# Patient Record
Sex: Female | Born: 2000 | Race: Black or African American | Hispanic: No | Marital: Single | Smoking: Never smoker
Health system: Southern US, Community
[De-identification: ages and names within clinical notes are randomized; demographics above are authoritative.]

## PROBLEM LIST (undated history)

## (undated) DIAGNOSIS — L509 Urticaria, unspecified: Secondary | ICD-10-CM

## (undated) DIAGNOSIS — F251 Schizoaffective disorder, depressive type: Secondary | ICD-10-CM

## (undated) DIAGNOSIS — D649 Anemia, unspecified: Secondary | ICD-10-CM

## (undated) DIAGNOSIS — R0602 Shortness of breath: Secondary | ICD-10-CM

## (undated) DIAGNOSIS — F32A Depression, unspecified: Secondary | ICD-10-CM

## (undated) DIAGNOSIS — E669 Obesity, unspecified: Secondary | ICD-10-CM

## (undated) DIAGNOSIS — I10 Essential (primary) hypertension: Secondary | ICD-10-CM

## (undated) DIAGNOSIS — F319 Bipolar disorder, unspecified: Secondary | ICD-10-CM

## (undated) DIAGNOSIS — D563 Thalassemia minor: Secondary | ICD-10-CM

## (undated) DIAGNOSIS — Z91018 Allergy to other foods: Secondary | ICD-10-CM

## (undated) DIAGNOSIS — R600 Localized edema: Secondary | ICD-10-CM

## (undated) DIAGNOSIS — E161 Other hypoglycemia: Secondary | ICD-10-CM

## (undated) DIAGNOSIS — M549 Dorsalgia, unspecified: Secondary | ICD-10-CM

## (undated) DIAGNOSIS — E119 Type 2 diabetes mellitus without complications: Secondary | ICD-10-CM

## (undated) DIAGNOSIS — Z7689 Persons encountering health services in other specified circumstances: Secondary | ICD-10-CM

## (undated) DIAGNOSIS — N943 Premenstrual tension syndrome: Secondary | ICD-10-CM

## (undated) DIAGNOSIS — F4310 Post-traumatic stress disorder, unspecified: Secondary | ICD-10-CM

## (undated) DIAGNOSIS — E611 Iron deficiency: Secondary | ICD-10-CM

## (undated) DIAGNOSIS — N946 Dysmenorrhea, unspecified: Secondary | ICD-10-CM

## (undated) DIAGNOSIS — F988 Other specified behavioral and emotional disorders with onset usually occurring in childhood and adolescence: Secondary | ICD-10-CM

## (undated) DIAGNOSIS — F909 Attention-deficit hyperactivity disorder, unspecified type: Secondary | ICD-10-CM

## (undated) DIAGNOSIS — F329 Major depressive disorder, single episode, unspecified: Secondary | ICD-10-CM

## (undated) DIAGNOSIS — F419 Anxiety disorder, unspecified: Secondary | ICD-10-CM

## (undated) DIAGNOSIS — N926 Irregular menstruation, unspecified: Secondary | ICD-10-CM

## (undated) HISTORY — DX: Allergy to other foods: Z91.018

## (undated) HISTORY — DX: Localized edema: R60.0

## (undated) HISTORY — DX: Irregular menstruation, unspecified: N92.6

## (undated) HISTORY — DX: Dorsalgia, unspecified: M54.9

## (undated) HISTORY — DX: Thalassemia minor: D56.3

## (undated) HISTORY — DX: Iron deficiency: E61.1

## (undated) HISTORY — DX: Shortness of breath: R06.02

## (undated) HISTORY — DX: Dysmenorrhea, unspecified: N94.6

## (undated) HISTORY — DX: Urticaria, unspecified: L50.9

## (undated) HISTORY — DX: Obesity, unspecified: E66.9

## (undated) HISTORY — DX: Other hypoglycemia: E16.1

## (undated) HISTORY — PX: TONSILLECTOMY: SUR1361

## (undated) HISTORY — DX: Bipolar disorder, unspecified: F31.9

## (undated) HISTORY — DX: Anxiety disorder, unspecified: F41.9

## (undated) HISTORY — DX: Type 2 diabetes mellitus without complications: E11.9

## (undated) HISTORY — DX: Persons encountering health services in other specified circumstances: Z76.89

## (undated) HISTORY — DX: Premenstrual tension syndrome: N94.3

## (undated) HISTORY — DX: Post-traumatic stress disorder, unspecified: F43.10

## (undated) HISTORY — PX: ADENOIDECTOMY: SUR15

## (undated) HISTORY — DX: Attention-deficit hyperactivity disorder, unspecified type: F90.9

---
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
## 2019-02-28 ENCOUNTER — Other Ambulatory Visit: Payer: Self-pay

## 2019-02-28 ENCOUNTER — Emergency Department (HOSPITAL_COMMUNITY)
Admission: EM | Admit: 2019-02-28 | Discharge: 2019-02-28 | Disposition: A | Discharge status: Home / Self Care | Payer: Medicaid Other | Attending: Emergency Medicine | Admitting: Emergency Medicine

## 2019-02-28 ENCOUNTER — Encounter (HOSPITAL_COMMUNITY): Payer: Self-pay | Admitting: Emergency Medicine

## 2019-02-28 ENCOUNTER — Emergency Department (HOSPITAL_COMMUNITY): Payer: Medicaid Other

## 2019-02-28 DIAGNOSIS — E119 Type 2 diabetes mellitus without complications: Secondary | ICD-10-CM | POA: Diagnosis not present

## 2019-02-28 DIAGNOSIS — R6883 Chills (without fever): Secondary | ICD-10-CM | POA: Diagnosis not present

## 2019-02-28 DIAGNOSIS — Z882 Allergy status to sulfonamides status: Secondary | ICD-10-CM | POA: Diagnosis not present

## 2019-02-28 DIAGNOSIS — R05 Cough: Secondary | ICD-10-CM | POA: Diagnosis not present

## 2019-02-28 DIAGNOSIS — Z20828 Contact with and (suspected) exposure to other viral communicable diseases: Secondary | ICD-10-CM | POA: Insufficient documentation

## 2019-02-28 DIAGNOSIS — B349 Viral infection, unspecified: Secondary | ICD-10-CM | POA: Diagnosis not present

## 2019-02-28 DIAGNOSIS — Z7984 Long term (current) use of oral hypoglycemic drugs: Secondary | ICD-10-CM | POA: Diagnosis not present

## 2019-02-28 DIAGNOSIS — Z794 Long term (current) use of insulin: Secondary | ICD-10-CM | POA: Diagnosis not present

## 2019-02-28 DIAGNOSIS — Z79899 Other long term (current) drug therapy: Secondary | ICD-10-CM | POA: Diagnosis not present

## 2019-02-28 DIAGNOSIS — I10 Essential (primary) hypertension: Secondary | ICD-10-CM | POA: Diagnosis not present

## 2019-02-28 DIAGNOSIS — R0602 Shortness of breath: Secondary | ICD-10-CM | POA: Diagnosis present

## 2019-02-28 DIAGNOSIS — Z8673 Personal history of transient ischemic attack (TIA), and cerebral infarction without residual deficits: Secondary | ICD-10-CM | POA: Diagnosis not present

## 2019-02-28 LAB — COMPREHENSIVE METABOLIC PANEL
ALT: 15 U/L (ref 0–44)
AST: 14 U/L — ABNORMAL LOW (ref 15–41)
Albumin: 3.7 g/dL (ref 3.5–5.0)
Alkaline Phosphatase: 101 U/L (ref 38–126)
Anion gap: 8 (ref 5–15)
BUN: 8 mg/dL (ref 6–20)
CO2: 23 mmol/L (ref 22–32)
Calcium: 9.1 mg/dL (ref 8.9–10.3)
Chloride: 104 mmol/L (ref 98–111)
Creatinine, Ser: 0.57 mg/dL (ref 0.44–1.00)
GFR calc Af Amer: >60 mL/min (ref >60)
GFR calc non Af Amer: >60 mL/min (ref >60)
Glucose, Bld: 93 mg/dL (ref 70–99)
Potassium: 3.9 mmol/L (ref 3.5–5.1)
Sodium: 135 mmol/L (ref 135–145)
Total Bilirubin: 0.5 mg/dL (ref 0.3–1.2)
Total Protein: 7.8 g/dL (ref 6.5–8.1)

## 2019-02-28 LAB — CBC WITH DIFFERENTIAL/PLATELET
Abs Immature Granulocytes: 0.01 10*3/uL (ref 0.00–0.07)
Basophils Absolute: 0 10*3/uL (ref 0.0–0.1)
Basophils Relative: 1 %
Eosinophils Absolute: 0.2 10*3/uL (ref 0.0–0.5)
Eosinophils Relative: 2 %
HCT: 36.1 % (ref 36.0–46.0)
Hemoglobin: 10.5 g/dL — ABNORMAL LOW (ref 12.0–15.0)
Immature Granulocytes: 0 %
Lymphocytes Relative: 29 %
Lymphs Abs: 2.1 10*3/uL (ref 0.7–4.0)
MCH: 19.7 pg — ABNORMAL LOW (ref 26.0–34.0)
MCHC: 29.1 g/dL — ABNORMAL LOW (ref 30.0–36.0)
MCV: 67.9 fL — ABNORMAL LOW (ref 80.0–100.0)
Monocytes Absolute: 0.8 10*3/uL (ref 0.1–1.0)
Monocytes Relative: 11 %
Neutro Abs: 4.3 10*3/uL (ref 1.7–7.7)
Neutrophils Relative %: 57 %
Platelets: 374 10*3/uL (ref 150–400)
RBC: 5.32 MIL/uL — ABNORMAL HIGH (ref 3.87–5.11)
RDW: 18.6 % — ABNORMAL HIGH (ref 11.5–15.5)
WBC: 7.4 10*3/uL (ref 4.0–10.5)
nRBC: 0 % (ref 0.0–0.2)

## 2019-02-28 LAB — URINALYSIS, ROUTINE W REFLEX MICROSCOPIC
Bacteria, UA: NONE SEEN
Bilirubin Urine: NEGATIVE
Glucose, UA: NEGATIVE mg/dL
Hgb urine dipstick: NEGATIVE
Ketones, ur: NEGATIVE mg/dL
Nitrite: NEGATIVE
Protein, ur: NEGATIVE mg/dL
Specific Gravity, Urine: 1.019 (ref 1.005–1.030)
pH: 6 (ref 5.0–8.0)

## 2019-02-28 LAB — SARS CORONAVIRUS 2 BY RT PCR (HOSPITAL ORDER, PERFORMED IN ~~LOC~~ HOSPITAL LAB): SARS Coronavirus 2: NEGATIVE

## 2019-02-28 NOTE — ED Provider Notes (Signed)
[facility] EMERGENCY DEPARTMENT Provider Note   CSN: [id number] Arrival date & time: 7[facility]/13/20  1318     History   Chief Complaint No chief complaint on file.   HPI Kathy Rios is a 18 y.o. female.     Patient complains of some shortness of breath and chills.  Mild cough.  The history is provided by the patient. No language interpreter was used.  Weakness Severity:  Mild Onset quality:  Sudden Timing:  Intermittent Progression:  Unable to specify Chronicity:  New Context: not alcohol use   Relieved by:  Nothing Worsened by:  Nothing Associated symptoms: no abdominal pain, no chest pain, no cough, no diarrhea, no frequency, no headaches and no seizures     Past Medical History:  Diagnosis Date  . ADD (attention deficit disorder)   . ADHD (attention deficit hyperactivity disorder)   . Anemia   . Anxiety   . Depression   . Diabetes mellitus   . Diabetes mellitus, type II ([facility])   . Encounter for menstrual regulation 01/25/2016  . Hypertension   . Irregular periods 10/25/2015  . Obesity   . PMS (premenstrual syndrome) 11/05/2015  . Reactive hypoglycemia   . Severe menstrual cramps 11/05/2015  . Thalassemia trait   . Thalassemia trait, alpha     Patient Active Problem List   Diagnosis Date Noted  . Constipation 03/08/2018  . Acanthosis nigricans 01/28/2017  . Prediabetes 07/01/2016  . Encounter for menstrual regulation 01/25/2016  . Severe menstrual cramps 11/05/2015  . PMS (premenstrual syndrome) 11/05/2015  . Irregular periods 10/25/2015  . 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 Tinea versicolor 12/23/2014  . PCOS (polycystic ovarian syndrome) 11/13/2014  . Alpha thalassemia trait 08/20/2012  . Hypertension 08/20/2012    Past Surgical History:  Procedure Laterality Date  . ADENOIDECTOMY    . TONSILLECTOMY    . WISDOM TOOTH EXTRACTION  07/2016     OB History    Gravida  0   Para  0   Term   0   Preterm  0   AB  0   Living  0     SAB  0   TAB  0   Ectopic  0   Multiple  0   Live Births               Home Medications    Prior to Admission medications   Medication Sig Start Date End Date Taking? Authorizing Provider  cetirizine (ZYRTEC) 10 MG tablet Take 10 mg by mouth as needed.    Yes [provider]  escitalopram (LEXAPRO) 20 MG tablet Take 1 tablet (20 mg total) by mouth daily. 12/21/18 12/21/19 Yes Myrlene Brokeross, Deborah R, MD  linaclotide [facility](LINZESS) 145 MCG CAPS capsule Take 1 capsule (145 mcg total) by mouth daily before breakfast. Patient taking differently: Take 145 mcg by mouth daily as needed (for constipation).  03/08/18  Yes Adline PotterGriffin, Jennifer A, NP  lisdexamfetamine (VYVANSE) 70 MG capsule Take 1 capsule (70 mg total) by mouth daily for 30 days. 12/21/18 02/28/19 Yes Myrlene Brokeross, Deborah R, MD  lisinopril (PRINIVIL,ZESTRIL) 10 MG tablet Take 10 mg by mouth daily.    Yes [provider]  Melatonin 5 MG TABS Take 2 tablets by mouth at bedtime as needed (sleep).    Yes [provider]  metFORMIN (GLUCOPHAGE) 500 MG tablet Take 500 mg by mouth 2 (two) times daily with a meal.     Yes [provider]  Multiple Vitamin (MULTIVITAMIN WITH MINERALS) TABS tablet Take 1 tablet by mouth daily.   Yes [provider]  Omega-3 Fatty Acids (FISH OIL PO) Take 1,000 mg by mouth daily.    Yes [provider]  valACYclovir (VALTREX) 1000 MG tablet Take 2 tablets by mouth 2 (two) times daily as needed (cold sores).  09/22/15  Yes [provider]  acetaminophen (TYLENOL) 500 MG tablet Take 1,000 mg by mouth every 6 (six) hours as needed for mild pain or headache.    [provider]  lisdexamfetamine (VYVANSE) 70 MG capsule Take 1 capsule (70 mg total) by mouth daily. Patient not taking: Reported on 02/28/2019 12/21/18   Myrlene Brokeross, Deborah R, MD  lisdexamfetamine (VYVANSE) 70 MG capsule Take 1 capsule (70 mg total) by mouth daily.  Patient not taking: Reported on 02/28/2019 12/21/18   Myrlene Brokeross, Deborah R, MD    Family History Family History  Problem Relation Age of Onset  . Seizures Mother   . Arthritis Mother   . Diabetes Mother   . Hyperlipidemia Mother   . Depression Mother   . Hyperlipidemia Father   . Hypertension Father   . Gout Father   . Dementia Father   . Hypertension Sister   . Mental illness Brother   . Hyperlipidemia Brother   . ADD / ADHD Brother   . Depression Brother   . Hypertension Maternal Grandmother   . Cancer Maternal Grandmother   . Hypertension Maternal Grandfather   . Thyroid disease Maternal Grandfather   . Anemia Paternal Grandmother   . Cancer Paternal Grandmother        cervical  . Hypertension Paternal Grandfather   . Heart disease Paternal Grandfather   . Hypertension Other   . Other Other        heart skips-maternal great grandma  . Other Other        fibroids- maternal grandma and great grandma  . Heart disease Other   . Schizophrenia Maternal Aunt   . Bipolar disorder Maternal Aunt   . Alcohol abuse Maternal Uncle     Social History Social History   Tobacco Use  . Smoking status: Never Smoker  . Smokeless tobacco: Never Used  Substance Use Topics  . Alcohol use: No  . Drug use: No     Allergies   Selenium sulfide, Motrin [ibuprofen], and Sulfa antibiotics   Review of Systems Review of Systems  Constitutional: Positive for chills. Negative for appetite change and fatigue.  HENT: Negative for congestion, ear discharge and sinus pressure.   Eyes: Negative for discharge.  Respiratory: Negative for cough.   Cardiovascular: Negative for chest pain.  Gastrointestinal: Negative for abdominal pain and diarrhea.  Genitourinary: Negative for frequency and hematuria.  Musculoskeletal: Negative for back pain.  Skin: Negative for rash.  Neurological: Positive for weakness. Negative for seizures and headaches.  Psychiatric/Behavioral: Negative for hallucinations.      Physical Exam Updated Vital Signs BP (!) 106/49   Pulse 70   Temp (!) 97.4 F (36.3 C) (Tympanic)   Resp (!) 25   Ht 5\' 5"  (1.651 m)   Wt 122.5 kg   LMP 02/06/2019   SpO2 99%   BMI 44.93 kg/m   Physical Exam Vitals signs and nursing note reviewed.  Constitutional:      Appearance: She is well-developed.  HENT:     Head: Normocephalic.     Nose: Nose normal.  Eyes:     General: No scleral icterus.  Conjunctiva/sclera: Conjunctivae normal.  Neck:     Musculoskeletal: Neck supple.     Thyroid: No thyromegaly.  Cardiovascular:     Rate and Rhythm: Normal rate and regular rhythm.     Heart sounds: No murmur. No friction rub. No gallop.   Pulmonary:     Breath sounds: No stridor. No wheezing or rales.  Chest:     Chest wall: No tenderness.  Abdominal:     General: There is no distension.     Tenderness: There is no abdominal tenderness. There is no rebound.  Musculoskeletal: Normal range of motion.  Lymphadenopathy:     Cervical: No cervical adenopathy.  Skin:    Findings: No erythema or rash.  Neurological:     Mental Status: She is alert and oriented to person, place, and time.     Motor: No abnormal muscle tone.     Coordination: Coordination normal.  Psychiatric:        Behavior: Behavior normal.      ED Treatments / Results  Labs (all labs ordered are listed, but only abnormal results are displayed) Labs Reviewed  CBC WITH DIFFERENTIAL/PLATELET - Abnormal; Notable for the following components:      Result Value   RBC 5.32 (*)    Hemoglobin 10.5 (*)    MCV 67.9 (*)    MCH 19.7 (*)    MCHC 29.1 (*)    RDW 18.6 (*)    All other components within normal limits  COMPREHENSIVE METABOLIC PANEL - Abnormal; Notable for the following components:   AST 14 (*)    All other components within normal limits  URINALYSIS, ROUTINE W REFLEX MICROSCOPIC - Abnormal; Notable for the following components:   APPearance HAZY (*)    Leukocytes,Ua TRACE (*)    All  other components within normal limits  SARS CORONAVIRUS 2 (HOSPITAL ORDER, [place] [facility])    EKG None  Radiology Dg Chest Port 1 View  Result Date: 02/28/2019 CLINICAL DATA:  Shortness of breath for 2 days. EXAM: PORTABLE CHEST 1 VIEW COMPARISON:  None. FINDINGS: The cardiomediastinal silhouette is unremarkable. There is no evidence of focal airspace disease, pulmonary edema, suspicious pulmonary nodule/mass, pleural effusion, or pneumothorax. No acute bony abnormalities are identified. IMPRESSION: No active disease. Electronically Signed   By: Margarette Canada M.D.   On: 02/28/2019 15:52    Procedures Procedures (including critical care time)  Medications Ordered in ED Medications - No data to display   Initial Impression / Assessment and Plan / ED Course  I have reviewed the triage vital signs and the nursing notes.  Pertinent labs & imaging results that were available during my care of the patient were reviewed by me and considered in my medical decision making (see chart for details).        Labs unremarkable.  Patient has a negative COVID test.  Suspect viral syndrome.  She will drink plenty of fluids take Tylenol follow-up if not improving  Final Clinical Impressions(s) / ED Diagnoses   Final diagnoses:  Viral syndrome    ED Discharge Orders    None       Milton Ferguson, MD 02/28/19 1835

## 2019-02-28 NOTE — ED Triage Notes (Signed)
Pt is c/o of sob and dizzy.

## 2019-02-28 NOTE — ED Triage Notes (Signed)
Pt states that this all started on 02/26/2019.

## 2019-02-28 NOTE — Discharge Instructions (Addendum)
Follow-up with your family doctor later this week if not improving..   Drink plenty of fluids take Tylenol for any aches

## 2019-03-21 ENCOUNTER — Encounter (HOSPITAL_COMMUNITY): Payer: Self-pay | Admitting: Emergency Medicine

## 2019-03-21 ENCOUNTER — Emergency Department (HOSPITAL_COMMUNITY)
Admission: EM | Admit: 2019-03-21 | Discharge: 2019-03-21 | Disposition: A | Discharge status: Home / Self Care | Payer: Medicaid Other | Attending: Emergency Medicine | Admitting: Emergency Medicine

## 2019-03-21 ENCOUNTER — Other Ambulatory Visit: Payer: Self-pay

## 2019-03-21 DIAGNOSIS — I10 Essential (primary) hypertension: Secondary | ICD-10-CM | POA: Insufficient documentation

## 2019-03-21 DIAGNOSIS — Z79899 Other long term (current) drug therapy: Secondary | ICD-10-CM | POA: Insufficient documentation

## 2019-03-21 DIAGNOSIS — T675XXA Heat exhaustion, unspecified, initial encounter: Secondary | ICD-10-CM | POA: Insufficient documentation

## 2019-03-21 DIAGNOSIS — Z7984 Long term (current) use of oral hypoglycemic drugs: Secondary | ICD-10-CM | POA: Diagnosis not present

## 2019-03-21 DIAGNOSIS — F909 Attention-deficit hyperactivity disorder, unspecified type: Secondary | ICD-10-CM | POA: Insufficient documentation

## 2019-03-21 DIAGNOSIS — R42 Dizziness and giddiness: Secondary | ICD-10-CM | POA: Diagnosis present

## 2019-03-21 DIAGNOSIS — E119 Type 2 diabetes mellitus without complications: Secondary | ICD-10-CM | POA: Diagnosis not present

## 2019-03-21 LAB — CBC WITH DIFFERENTIAL/PLATELET
Abs Immature Granulocytes: 0.01 10*3/uL (ref 0.00–0.07)
Basophils Absolute: 0.1 10*3/uL (ref 0.0–0.1)
Basophils Relative: 1 %
Eosinophils Absolute: 0.1 10*3/uL (ref 0.0–0.5)
Eosinophils Relative: 2 %
HCT: 36.5 % (ref 36.0–46.0)
Hemoglobin: 10.7 g/dL — ABNORMAL LOW (ref 12.0–15.0)
Immature Granulocytes: 0 %
Lymphocytes Relative: 35 %
Lymphs Abs: 2.6 10*3/uL (ref 0.7–4.0)
MCH: 19.7 pg — ABNORMAL LOW (ref 26.0–34.0)
MCHC: 29.3 g/dL — ABNORMAL LOW (ref 30.0–36.0)
MCV: 67.2 fL — ABNORMAL LOW (ref 80.0–100.0)
Monocytes Absolute: 0.5 10*3/uL (ref 0.1–1.0)
Monocytes Relative: 7 %
Neutro Abs: 4.2 10*3/uL (ref 1.7–7.7)
Neutrophils Relative %: 55 %
Platelets: 382 10*3/uL (ref 150–400)
RBC: 5.43 MIL/uL — ABNORMAL HIGH (ref 3.87–5.11)
RDW: 18.9 % — ABNORMAL HIGH (ref 11.5–15.5)
WBC: 7.6 10*3/uL (ref 4.0–10.5)
nRBC: 0 % (ref 0.0–0.2)

## 2019-03-21 LAB — COMPREHENSIVE METABOLIC PANEL
ALT: 14 U/L (ref 0–44)
AST: 15 U/L (ref 15–41)
Albumin: 4 g/dL (ref 3.5–5.0)
Alkaline Phosphatase: 109 U/L (ref 38–126)
Anion gap: 9 (ref 5–15)
BUN: 10 mg/dL (ref 6–20)
CO2: 25 mmol/L (ref 22–32)
Calcium: 9.5 mg/dL (ref 8.9–10.3)
Chloride: 105 mmol/L (ref 98–111)
Creatinine, Ser: 0.64 mg/dL (ref 0.44–1.00)
GFR calc Af Amer: >60 mL/min (ref >60)
GFR calc non Af Amer: >60 mL/min (ref >60)
Glucose, Bld: 105 mg/dL — ABNORMAL HIGH (ref 70–99)
Potassium: 4.1 mmol/L (ref 3.5–5.1)
Sodium: 139 mmol/L (ref 135–145)
Total Bilirubin: 0.2 mg/dL — ABNORMAL LOW (ref 0.3–1.2)
Total Protein: 8 g/dL (ref 6.5–8.1)

## 2019-03-21 LAB — URINALYSIS, ROUTINE W REFLEX MICROSCOPIC
Bilirubin Urine: NEGATIVE
Glucose, UA: NEGATIVE mg/dL
Hgb urine dipstick: NEGATIVE
Ketones, ur: NEGATIVE mg/dL
Leukocytes,Ua: NEGATIVE
Nitrite: NEGATIVE
Protein, ur: NEGATIVE mg/dL
Specific Gravity, Urine: 1.026 (ref 1.005–1.030)
pH: 5 (ref 5.0–8.0)

## 2019-03-21 LAB — PREGNANCY, URINE: Preg Test, Ur: NEGATIVE

## 2019-03-21 LAB — CBG MONITORING, ED: Glucose-Capillary: 103 mg/dL — ABNORMAL HIGH (ref 70–99)

## 2019-03-21 NOTE — ED Triage Notes (Signed)
Pt c/o nausea and lightheadedness that began earlier today. Pt also c/o sore throat two days ago, but that has resolved. Denies known COVID exposure. Denies fever, cough, or SOB.

## 2019-03-21 NOTE — Discharge Instructions (Addendum)
Increase fluids.  Avoid overheating

## 2019-03-21 NOTE — ED Provider Notes (Signed)
[facility] EMERGENCY DEPARTMENT Provider Note   CSN: [id number] Arrival date & time: 03/21/19  1444     History   Chief Complaint Chief Complaint  Patient presents with  . Dizziness    HPI Kathy Rios is a 18 y.o. female.     The history is provided by the patient. No language interpreter was used.  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  Patient states today she was working outside all day carrying groceries 2 cars.  Patient states she became very hot had sweating and began feeling very dizzy.  Patient presents to the emergency department because dizziness has continued.  She states during her shift she did not drink any fluids. Past Medical History:  Diagnosis Date  . ADD (attention deficit disorder)   . ADHD (attention deficit hyperactivity disorder)   . Anemia   . Anxiety   . Depression   . Diabetes mellitus   . Diabetes mellitus, type II (HCC)   . Encounter for menstrual regulation 01/25/2016  . Hypertension   . Irregular periods 10/25/2015  . Obesity   . PMS (premenstrual syndrome) 11/05/2015  . Reactive hypoglycemia   . Severe menstrual cramps 11/05/2015  . Thalassemia trait   . Thalassemia trait, alpha     Patient Active Problem List   Diagnosis Date Noted  . Constipation 03/08/2018  . Acanthosis nigricans 01/28/2017  . Prediabetes 07/01/2016  . Encounter for menstrual regulation 01/25/2016  . Severe menstrual cramps 11/05/2015  . PMS (premenstrual syndrome) 11/05/2015  . Irregular periods 10/25/2015  . 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 Tinea versicolor 12/23/2014  . PCOS (polycystic ovarian syndrome) 11/13/2014  .  Alpha thalassemia trait 08/20/2012  . Hypertension 08/20/2012    Past Surgical History:  Procedure Laterality Date  . ADENOIDECTOMY    . TONSILLECTOMY    . WISDOM TOOTH EXTRACTION  07/2016     OB History    Gravida  0   Para  0   Term  0   Preterm  0   AB  0   Living  0     SAB  0   TAB  0   Ectopic  0   Multiple  0   Live Births               Home Medications    Prior to Admission medications   Medication Sig Start Date End Date Taking? Authorizing Provider  acetaminophen (TYLENOL) 500 MG tablet Take 1,000 mg by mouth every 6 (six) hours as needed for mild pain or headache.    [provider]  cetirizine (ZYRTEC) 10 MG tablet Take 10 mg by mouth as needed.     [provider]  escitalopram (LEXAPRO) 20 MG tablet Take 1 tablet (20 mg total) by mouth daily. 12/21/18 12/21/19  Myrlene Brokeross, Deborah R, MD  linaclotide [facility](LINZESS) 145 MCG CAPS capsule Take 1 capsule (145 mcg total) by mouth daily before breakfast. Patient taking differently: Take 145 mcg by mouth daily as needed (for constipation).  03/08/18   Adline PotterGriffin, Jennifer A, NP  lisdexamfetamine (VYVANSE) 70 MG capsule Take 1 capsule (70 mg total) by mouth daily for 30 days. 12/21/18 02/28/19  Myrlene Brokeross, Deborah R, MD  lisdexamfetamine (VYVANSE) 70 MG capsule Take 1 capsule (70 mg total) by mouth daily. Patient not taking: Reported on 02/28/2019 12/21/18   Cloria Spring, MD  lisdexamfetamine (VYVANSE) 70 MG capsule Take 1 capsule (70 mg total) by mouth daily. Patient not taking: Reported on 02/28/2019 12/21/18   Cloria Spring, MD  lisinopril (PRINIVIL,ZESTRIL) 10 MG tablet Take 10 mg by mouth daily.     [provider]  Melatonin 5 MG TABS Take 2 tablets by mouth at bedtime as needed (sleep).     [provider]  metFORMIN (GLUCOPHAGE) 500 MG tablet Take 500 mg by mouth 2 (two) times daily with a meal.      [provider]  Multiple Vitamin (MULTIVITAMIN WITH MINERALS) TABS tablet Take 1  tablet by mouth daily.    [provider]  Omega-3 Fatty Acids (FISH OIL PO) Take 1,000 mg by mouth daily.     [provider]  valACYclovir (VALTREX) 1000 MG tablet Take 2 tablets by mouth 2 (two) times daily as needed (cold sores).  09/22/15   [provider]    Family History Family History  Problem Relation Age of Onset  . Seizures Mother   . Arthritis Mother   . Diabetes Mother   . Hyperlipidemia Mother   . Depression Mother   . Hyperlipidemia Father   . Hypertension Father   . Gout Father   . Dementia Father   . Hypertension Sister   . Mental illness Brother   . Hyperlipidemia Brother   . ADD / ADHD Brother   . Depression Brother   . Hypertension Maternal Grandmother   . Cancer Maternal Grandmother   . Hypertension Maternal Grandfather   . Thyroid disease Maternal Grandfather   . Anemia Paternal Grandmother   . Cancer Paternal Grandmother        cervical  . Hypertension Paternal Grandfather   . Heart disease Paternal Grandfather   . Hypertension Other   . Other Other        heart skips-maternal great grandma  . Other Other        fibroids- maternal grandma and great grandma  . Heart disease Other   . Schizophrenia Maternal Aunt   . Bipolar disorder Maternal Aunt   . Alcohol abuse Maternal Uncle     Social History Social History   Tobacco Use  . Smoking status: Never Smoker  . Smokeless tobacco: Never Used  Substance Use Topics  . Alcohol use: No  . Drug use: No     Allergies   Selenium sulfide, Motrin [ibuprofen], and Sulfa antibiotics   Review of Systems Review of Systems  Neurological: Positive for dizziness.  All other systems reviewed and are negative.    Physical Exam Updated Vital Signs BP 127/66 (BP Location: Right Arm)   Pulse 94   Temp 99.1 F (37.3 C) (Oral)   Resp 18   Ht 5\' 5"  (1.651 m)   Wt (!) 137 kg   LMP 03/04/2019   SpO2 98%   BMI 50.26 kg/m   Physical Exam Vitals signs and nursing note  reviewed.  Constitutional:      Appearance: She is well-developed.  HENT:     Head: Normocephalic.     Nose: Nose normal.     Mouth/Throat:     Mouth: Mucous membranes are moist.  Eyes:     Pupils: Pupils are equal, round, and reactive to light.  Neck:     Musculoskeletal: Normal range of motion.  Cardiovascular:     Rate and Rhythm: Normal rate and regular rhythm.  Pulmonary:     Effort: Pulmonary effort is normal.  Abdominal:     General: Abdomen is flat. There is no distension.     Palpations: Abdomen is soft.  Musculoskeletal: Normal range of motion.  Skin:    General: Skin is warm.  Neurological:     Mental Status: She is alert and oriented to person, place, and time.  Psychiatric:        Mood and Affect: Mood normal.      ED Treatments / Results  Labs (all labs ordered are listed, but only abnormal results are displayed) Labs Reviewed  URINALYSIS, ROUTINE W REFLEX MICROSCOPIC - Abnormal; Notable for the following components:      Result Value   APPearance HAZY (*)    All other components within normal limits  CBC WITH DIFFERENTIAL/PLATELET - Abnormal; Notable for the following components:   RBC 5.43 (*)    Hemoglobin 10.7 (*)    MCV 67.2 (*)    MCH 19.7 (*)    MCHC 29.3 (*)    RDW 18.9 (*)    All other components within normal limits  COMPREHENSIVE METABOLIC PANEL - Abnormal; Notable for the following components:   Glucose, Bld 105 (*)    Total Bilirubin 0.2 (*)    All other components within normal limits  CBG MONITORING, ED - Abnormal; Notable for the following components:   Glucose-Capillary 103 (*)    All other components within normal limits  PREGNANCY, URINE    EKG None  Radiology No results found.  Procedures Procedures (including critical care time)  Medications Ordered in ED Medications - No data to display   Initial Impression / Assessment and Plan / ED Course  I have reviewed the triage vital signs and the nursing notes.   Pertinent labs & imaging results that were available during my care of the patient were reviewed by me and considered in my medical decision making (see chart for details).        MDM: Laboratory evaluations discussed with patient.  Patient's hemoglobin is 10.7 patient reports this is her baseline.  Patient has a normal EKG urinalysis is normal she is not pregnant.  All patient's symptoms seem to be related to heat exposure and not drinking fluids.  Patient request a work note for her job so that she can work area that is not so hot.  Final Clinical Impressions(s) / ED Diagnoses   Final diagnoses:  Heat exhaustion, initial encounter    ED Discharge Orders    None    An After Visit Summary was printed and given to the patient.    Osie CheeksSofia, Marlys Stegmaier K, PA-C 03/21/19 2112    Bethann BerkshireZammit, Joseph, MD 03/25/19 [phone]

## 2019-05-17 ENCOUNTER — Other Ambulatory Visit (HOSPITAL_COMMUNITY): Payer: Self-pay | Admitting: Psychiatry

## 2019-05-18 NOTE — Telephone Encounter (Signed)
LVM: PER PROVIDER PATIENT NEEDS APPT. LAST VISIT  12/21/2018

## 2019-05-18 NOTE — Telephone Encounter (Signed)
Pt needs appt

## 2019-07-06 ENCOUNTER — Other Ambulatory Visit: Payer: Self-pay

## 2019-07-06 DIAGNOSIS — Z20822 Contact with and (suspected) exposure to covid-19: Secondary | ICD-10-CM

## 2019-07-08 LAB — NOVEL CORONAVIRUS, NAA: SARS-CoV-2, NAA: NOT DETECTED

## 2019-08-05 ENCOUNTER — Other Ambulatory Visit (HOSPITAL_COMMUNITY): Payer: Self-pay | Admitting: Psychiatry

## 2019-08-06 NOTE — Telephone Encounter (Signed)
Call for appt

## 2019-08-08 NOTE — Telephone Encounter (Signed)
Per provider: Call for appt  LVM to call to SCHEDULE APPT

## 2019-11-22 ENCOUNTER — Ambulatory Visit: Payer: Medicaid Other | Attending: Internal Medicine

## 2020-03-22 ENCOUNTER — Other Ambulatory Visit (HOSPITAL_COMMUNITY): Payer: Self-pay | Admitting: Psychiatry

## 2020-03-22 NOTE — Telephone Encounter (Signed)
Call for appt

## 2020-04-04 ENCOUNTER — Other Ambulatory Visit: Payer: Self-pay

## 2020-04-04 ENCOUNTER — Telehealth (INDEPENDENT_AMBULATORY_CARE_PROVIDER_SITE_OTHER): Payer: Medicaid Other | Admitting: Psychiatry

## 2020-04-04 DIAGNOSIS — Z5329 Procedure and treatment not carried out because of patient's decision for other reasons: Secondary | ICD-10-CM

## 2020-04-11 ENCOUNTER — Telehealth (INDEPENDENT_AMBULATORY_CARE_PROVIDER_SITE_OTHER): Payer: Medicaid Other | Admitting: Psychiatry

## 2020-04-11 ENCOUNTER — Other Ambulatory Visit: Payer: Self-pay

## 2020-04-11 ENCOUNTER — Encounter (HOSPITAL_COMMUNITY): Payer: Self-pay | Admitting: Psychiatry

## 2020-04-11 DIAGNOSIS — F321 Major depressive disorder, single episode, moderate: Secondary | ICD-10-CM | POA: Diagnosis not present

## 2020-04-11 MED ORDER — ESCITALOPRAM OXALATE 20 MG PO TABS: 20.0000 mg | ORAL_TABLET | Freq: Two times a day (BID) | ORAL | 2 refills | Status: DC

## 2020-04-11 NOTE — Progress Notes (Signed)
Virtual Visit via Telephone Note  I connected with Kathy Rios on 04/11/20 at  1:40 PM EDT by telephone and verified that I am speaking with the correct person using two identifiers.   I discussed the limitations, risks, security and privacy concerns of performing an evaluation and management service by telephone and the availability of in person appointments. I also discussed with the patient that there may be a patient responsible charge related to this service. The patient expressed understanding and agreed to proceed    I discussed the assessment and treatment plan with the patient. The patient was provided an opportunity to ask questions and all were answered. The patient agreed with the plan and demonstrated an understanding of the instructions.   The patient was advised to call back or seek an in-person evaluation if the symptoms worsen or if the condition fails to improve as anticipated.  I provided 15 minutes of non-face-to-face time during this encounter. Location: Provider Home, patient home  Diannia Ruder, MD  [facility] MD/PA/NP OP Progress Note  04/11/2020 2:05 PM Kathy Rios  MRN:  [id number]  Chief Complaint:  Chief Complaint    Depression; Anxiety; Follow-up     HPI: This patient is a 19 year old black female who lives with her parents and younger brother in [place].  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  The patient returns after long absence.  She was last seen 15 months ago.  She states that she is having a high level of anxiety lately.  She feels very anxious at work being surrounded by a lot of people.  Somehow she has stayed on Lexapro 20 mg daily.  She states it helps to some degree.  However she is depressed because she wants to go to college to study optometry.  She and her grandfather took out all cosigned alone on a brand-new car last year.  Since then her grandfathers passed away.  She wants to get out of the car alone but does  not see how she can do it.  She now feels relegated to working full-time.  I urged her to talk to the loan company or someone it legal aid.  The patient is thinking about seeing a counselor that her mother recommended she denies any current thoughts of self-harm or suicide.  She is no longer taking Vyvanse because she is not in school.  I suggested we go up on the Lexapro to help with the anxiety and mood and that she also start receiving counseling. Visit Diagnosis:    ICD-10-CM   1. Current moderate episode of major depressive disorder without prior episode (HCC)  F32.1     Past Psychiatric History: none  Past Medical History:  Past Medical History:  Diagnosis Date  . ADD (attention deficit disorder)   . ADHD (attention deficit hyperactivity disorder)   . Anemia   . Anxiety   . Depression   . Diabetes mellitus   . Diabetes mellitus, type II (HCC)   . Encounter for menstrual regulation 01/25/2016  . Hypertension   . Irregular periods 10/25/2015  . Obesity   . PMS (premenstrual syndrome) 11/05/2015  . Reactive hypoglycemia   . Severe menstrual cramps 11/05/2015  . Thalassemia trait   . Thalassemia trait, alpha     Past Surgical History:  Procedure Laterality Date  . ADENOIDECTOMY    . TONSILLECTOMY    . WISDOM TOOTH EXTRACTION  07/2016    Family Psychiatric History:none  Family History:  Family History  Problem Relation Age of Onset  . Seizures Mother   . Arthritis Mother   . Diabetes Mother   . Hyperlipidemia Mother   . Depression Mother   . Hyperlipidemia Father   . Hypertension Father   . Gout Father   . Dementia Father   . Hypertension Sister   . Mental illness Brother   . Hyperlipidemia Brother   . ADD / ADHD Brother   . Depression Brother   . Hypertension Maternal Grandmother   . Cancer Maternal Grandmother   . Hypertension Maternal Grandfather   . Thyroid disease Maternal Grandfather   . Anemia Paternal Grandmother   . Cancer Paternal Grandmother         cervical  . Hypertension Paternal Grandfather   . Heart disease Paternal Grandfather   . Hypertension Other   . Other Other        heart skips-maternal great grandma  . Other Other        fibroids- maternal grandma and great grandma  . Heart disease Other   . Schizophrenia Maternal Aunt   . Bipolar disorder Maternal Aunt   . Alcohol abuse Maternal Uncle     Social History:  Social History   Socioeconomic History  . Marital status: Single    Spouse name: Not on file  . Number of children: Not on file  . Years of education: Not on file  . Highest education level: Not on file  Occupational History  . Not on file  Tobacco Use  . Smoking status: Never Smoker  . Smokeless tobacco: Never Used  Vaping Use  . Vaping Use: Never used  Substance and Sexual Activity  . Alcohol use: No  . Drug use: No  . Sexual activity: Never    Birth control/protection: Abstinence  Other Topics Concern  . Not on file  Social History Narrative  . Not on file   Social Determinants of Health   Financial Resource Strain:   . Difficulty of Paying Living Expenses: Not on file  Food Insecurity:   . Worried About Programme researcher, broadcasting/film/video in the Last Year: Not on file  . Ran Out of Food in the Last Year: Not on file  Transportation Needs:   . Lack of Transportation (Medical): Not on file  . Lack of Transportation (Non-Medical): Not on file  Physical Activity:   . Days of Exercise per Week: Not on file  . Minutes of Exercise per Session: Not on file  Stress:   . Feeling of Stress : Not on file  Social Connections:   . Frequency of Communication with Friends and Family: Not on file  . Frequency of Social Gatherings with Friends and Family: Not on file  . Attends Religious Services: Not on file  . Active Member of Clubs or Organizations: Not on file  . Attends Banker Meetings: Not on file  . Marital Status: Not on file    Allergies:  Allergies  Allergen Reactions  . Selenium  Sulfide Rash  . Motrin [Ibuprofen] Hives and Itching  . Sulfa Antibiotics Rash    Metabolic Disorder Labs: No results found for: HGBA1C, MPG No results found for: PROLACTIN No results found for: CHOL, TRIG, HDL, CHOLHDL, VLDL, LDLCALC No results found for: TSH  Therapeutic Level Labs: No results found for: LITHIUM No results found for: VALPROATE No components found for:  CBMZ  Current Medications: Current Outpatient Medications  Medication Sig Dispense Refill  . acetaminophen (TYLENOL) 500 MG  tablet Take 1,000 mg by mouth every 6 (six) hours as needed for mild pain or headache.    . cetirizine (ZYRTEC) 10 MG tablet Take 10 mg by mouth as needed.     Marland Kitchen escitalopram (LEXAPRO) 20 MG tablet Take 1 tablet (20 mg total) by mouth 2 (two) times daily. 60 tablet 2  . linaclotide (LINZESS) 145 MCG CAPS capsule Take 1 capsule (145 mcg total) by mouth daily before breakfast. (Patient taking differently: Take 145 mcg by mouth daily as needed (for constipation). ) 30 capsule 12  . lisinopril (PRINIVIL,ZESTRIL) 10 MG tablet Take 10 mg by mouth daily.     . Melatonin 5 MG TABS Take 2 tablets by mouth at bedtime as needed (sleep).     . metFORMIN (GLUCOPHAGE) 500 MG tablet Take 500 mg by mouth 2 (two) times daily with a meal.      . Multiple Vitamin (MULTIVITAMIN WITH MINERALS) TABS tablet Take 1 tablet by mouth daily.    . Omega-3 Fatty Acids (FISH OIL PO) Take 1,000 mg by mouth daily.     . valACYclovir (VALTREX) 1000 MG tablet Take 2 tablets by mouth 2 (two) times daily as needed (cold sores).   1   No current facility-administered medications for this visit.     Musculoskeletal: Strength & Muscle Tone: within normal limits Gait & Station: normal Patient leans: N/A  Psychiatric Specialty Exam: Review of Systems  Psychiatric/Behavioral: Positive for dysphoric mood. The patient is nervous/anxious.   All other systems reviewed and are negative.   There were no vitals taken for this  visit.There is no height or weight on file to calculate BMI.  General Appearance: NA  Eye Contact:  NA  Speech:  Clear and Coherent  Volume:  Normal  Mood:  Anxious and Dysphoric  Affect:  NA  Thought Process:  Goal Directed  Orientation:  Full (Time, Place, and Person)  Thought Content: Rumination   Suicidal Thoughts:  No  Homicidal Thoughts:  No  Memory:  Immediate;   Good Recent;   Good Remote;   Good  Judgement:  Fair  Insight:  Fair  Psychomotor Activity:  Normal  Concentration:  Concentration: Fair and Attention Span: Fair  Recall:  Good  Fund of Knowledge: Good  Language: Good  Akathisia:  No  Handed:  Right  AIMS (if indicated): not done  Assets:  Communication Skills Desire for Improvement Physical Health Resilience Social Support Talents/Skills  ADL's:  Intact  Cognition: WNL  Sleep:  Good   Screenings: PHQ2-9     [facility] 09/08/2018 [facility]  PHQ-2 Total Score 1       Assessment and Plan: This patient is a 19 year old female with a history of depression anxiety and ADD.  Since she is still having symptoms of depression and anxiety will increase Lexapro to 20 mg twice daily.  She will return to see me in 4 weeks   Diannia Ruder, MD 04/11/2020, 2:05 PM

## 2020-04-14 ENCOUNTER — Encounter (HOSPITAL_COMMUNITY): Payer: Self-pay | Admitting: Emergency Medicine

## 2020-04-14 ENCOUNTER — Other Ambulatory Visit: Payer: Self-pay

## 2020-04-14 ENCOUNTER — Emergency Department (HOSPITAL_COMMUNITY)
Admission: EM | Admit: 2020-04-14 | Discharge: 2020-04-14 | Disposition: A | Discharge status: Home / Self Care | Payer: Medicaid Other | Attending: Emergency Medicine | Admitting: Emergency Medicine

## 2020-04-14 ENCOUNTER — Encounter: Payer: Self-pay | Admitting: Emergency Medicine

## 2020-04-14 ENCOUNTER — Ambulatory Visit
Admission: EM | Admit: 2020-04-14 | Discharge: 2020-04-14 | Disposition: A | Discharge status: Home / Self Care | Payer: Medicaid Other | Attending: Emergency Medicine | Admitting: Emergency Medicine

## 2020-04-14 ENCOUNTER — Emergency Department (HOSPITAL_COMMUNITY): Payer: Medicaid Other

## 2020-04-14 DIAGNOSIS — D560 Alpha thalassemia: Secondary | ICD-10-CM | POA: Diagnosis not present

## 2020-04-14 DIAGNOSIS — R300 Dysuria: Secondary | ICD-10-CM | POA: Insufficient documentation

## 2020-04-14 DIAGNOSIS — I10 Essential (primary) hypertension: Secondary | ICD-10-CM | POA: Insufficient documentation

## 2020-04-14 DIAGNOSIS — R319 Hematuria, unspecified: Secondary | ICD-10-CM | POA: Diagnosis not present

## 2020-04-14 DIAGNOSIS — Z7984 Long term (current) use of oral hypoglycemic drugs: Secondary | ICD-10-CM | POA: Diagnosis not present

## 2020-04-14 DIAGNOSIS — E119 Type 2 diabetes mellitus without complications: Secondary | ICD-10-CM | POA: Diagnosis not present

## 2020-04-14 LAB — URINALYSIS, ROUTINE W REFLEX MICROSCOPIC
Glucose, UA: NEGATIVE mg/dL
Ketones, ur: NEGATIVE mg/dL
Leukocytes,Ua: NEGATIVE
Nitrite: NEGATIVE
Protein, ur: 100 mg/dL — AB
Specific Gravity, Urine: >1.03 — ABNORMAL HIGH (ref 1.005–1.030)
pH: 5 (ref 5.0–8.0)

## 2020-04-14 LAB — POCT URINALYSIS DIP (MANUAL ENTRY)
Glucose, UA: NEGATIVE mg/dL
Ketones, POC UA: NEGATIVE mg/dL
Leukocytes, UA: NEGATIVE
Nitrite, UA: NEGATIVE
Protein Ur, POC: 100 mg/dL — AB
Spec Grav, UA: 1.025 (ref 1.010–1.025)
Urobilinogen, UA: 0.2 E.U./dL
pH, UA: 5 (ref 5.0–8.0)

## 2020-04-14 LAB — URINALYSIS, MICROSCOPIC (REFLEX): RBC / HPF: >50 RBC/hpf (ref 0–5)

## 2020-04-14 LAB — POCT URINE PREGNANCY: Preg Test, Ur: NEGATIVE

## 2020-04-14 NOTE — ED Triage Notes (Signed)
painful urination weakness since yesterday

## 2020-04-14 NOTE — ED Provider Notes (Signed)
[facility] EMERGENCY DEPARTMENT Provider Note   CSN: [id number] Arrival date & time: 04/14/20  1759     History No chief complaint on file.   Kathy Rios is a 19 y.o. female.  HPI      Kathy Rios is a 19 y.o. female with past medical history for hypertension, type 2 diabetes, anemia, alpha thalassemia trait anxiety, and ADHD who presents to the Emergency Department complaining of hematuria and mild dysuria.  Symptoms began yesterday.  She reports noticing a "pink color" to the tissue after voiding.  She also endorses having some sharp stinging pain associated with voiding.  She states she began her menstrual cycle today.  She denies fever, flank pain, chills, abdominal pain, nausea or vomiting.  She was seen at the local urgent care earlier today and advised that her urine test was negative for evidence of a urinary tract infection.  Urine culture is pending.  She had a negative urine pregnancy test during her urgent care visit.  She also denies vaginal discharge   Past Medical History:  Diagnosis Date  . ADD (attention deficit disorder)   . ADHD (attention deficit hyperactivity disorder)   . Anemia   . Anxiety   . Depression   . Diabetes mellitus   . Diabetes mellitus, type II (HCC)   . Encounter for menstrual regulation 01/25/2016  . Hypertension   . Irregular periods 10/25/2015  . Obesity   . PMS (premenstrual syndrome) 11/05/2015  . Reactive hypoglycemia   . Severe menstrual cramps 11/05/2015  . Thalassemia trait   . Thalassemia trait, alpha     Patient Active Problem List   Diagnosis Date Noted  . Constipation 03/08/2018  . Acanthosis nigricans 01/28/2017  . Prediabetes 07/01/2016  . Encounter for menstrual regulation 01/25/2016  . Severe menstrual cramps 11/05/2015  . PMS (premenstrual syndrome) 11/05/2015  . Irregular periods 10/25/2015  . 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 Tinea versicolor  12/23/2014  . PCOS (polycystic ovarian syndrome) 11/13/2014  . Alpha thalassemia trait 08/20/2012  . Hypertension 08/20/2012    Past Surgical History:  Procedure Laterality Date  . ADENOIDECTOMY    . TONSILLECTOMY    . WISDOM TOOTH EXTRACTION  07/2016     OB History    Gravida  0   Para  0   Term  0   Preterm  0   AB  0   Living  0     SAB  0   TAB  0   Ectopic  0   Multiple  0   Live Births              Family History  Problem Relation Age of Onset  . Seizures Mother   . Arthritis Mother   . Diabetes Mother   . Hyperlipidemia Mother   . Depression Mother   . Hyperlipidemia Father   . Hypertension Father   . Gout Father   . Dementia Father   . Hypertension Sister   . Mental illness Brother   . Hyperlipidemia Brother   . ADD / ADHD Brother   . Depression Brother   . Hypertension Maternal Grandmother   . Cancer Maternal Grandmother   . Hypertension Maternal Grandfather   . Thyroid disease Maternal Grandfather   . Anemia Paternal Grandmother   . Cancer Paternal Grandmother        cervical  . Hypertension Paternal Grandfather   . Heart disease Paternal Grandfather   . Hypertension  Other   . Other Other        heart skips-maternal great grandma  . Other Other        fibroids- maternal grandma and great grandma  . Heart disease Other   . Schizophrenia Maternal Aunt   . Bipolar disorder Maternal Aunt   . Alcohol abuse Maternal Uncle     Social History   Tobacco Use  . Smoking status: Never Smoker  . Smokeless tobacco: Never Used  Vaping Use  . Vaping Use: Never used  Substance Use Topics  . Alcohol use: No  . Drug use: No    Home Medications Prior to Admission medications   Medication Sig Start Date End Date Taking? Authorizing Provider  acetaminophen (TYLENOL) 500 MG tablet Take 1,000 mg by mouth every 6 (six) hours as needed for mild pain or headache.    [provider]  cetirizine (ZYRTEC) 10 MG tablet Take 10 mg by  mouth as needed.     [provider]  escitalopram (LEXAPRO) 20 MG tablet Take 1 tablet (20 mg total) by mouth 2 (two) times daily. 04/11/20   Myrlene Broker, MD  linaclotide [facility]) 145 MCG CAPS capsule Take 1 capsule (145 mcg total) by mouth daily before breakfast. Patient taking differently: Take 145 mcg by mouth daily as needed (for constipation).  03/08/18   Adline Potter, NP  lisinopril (PRINIVIL,ZESTRIL) 10 MG tablet Take 10 mg by mouth daily.     [provider]  Melatonin 5 MG TABS Take 2 tablets by mouth at bedtime as needed (sleep).     [provider]  metFORMIN (GLUCOPHAGE) 500 MG tablet Take 500 mg by mouth 2 (two) times daily with a meal.      [provider]  Multiple Vitamin (MULTIVITAMIN WITH MINERALS) TABS tablet Take 1 tablet by mouth daily.    [provider]  Omega-3 Fatty Acids (FISH OIL PO) Take 1,000 mg by mouth daily.     [provider]  valACYclovir (VALTREX) 1000 MG tablet Take 2 tablets by mouth 2 (two) times daily as needed (cold sores).  09/22/15   [provider]    Allergies    Selenium sulfide, Motrin [ibuprofen], and Sulfa antibiotics  Review of Systems   Review of Systems  Constitutional: Negative for chills, fatigue and fever.  Respiratory: Negative for cough and shortness of breath.   Cardiovascular: Negative for chest pain.  Gastrointestinal: Negative for abdominal pain, diarrhea, nausea and vomiting.  Genitourinary: Positive for difficulty urinating, dysuria and hematuria. Negative for decreased urine volume, flank pain and vaginal discharge.  Musculoskeletal: Negative for arthralgias, back pain and myalgias.  Skin: Negative for rash.  Neurological: Negative for dizziness, weakness and numbness.  Hematological: Does not bruise/bleed easily.    Physical Exam Updated Vital Signs BP (!) 130/96 (BP Location: Left Arm)   Pulse 91   Temp 98.6 F (37 C) (Oral)   LMP 04/14/2020    SpO2 96%   Physical Exam Vitals and nursing note reviewed.  Constitutional:      Appearance: Normal appearance. She is not ill-appearing or toxic-appearing.  HENT:     Head: Normocephalic.     Mouth/Throat:     Mouth: Mucous membranes are moist.  Eyes:     Pupils: Pupils are equal, round, and reactive to light.  Neck:     Thyroid: No thyromegaly.     Meningeal: Kernig's sign absent.  Cardiovascular:     Rate and Rhythm: Normal rate  and regular rhythm.     Pulses: Normal pulses.  Pulmonary:     Effort: Pulmonary effort is normal.     Breath sounds: Normal breath sounds. No wheezing.  Abdominal:     Palpations: Abdomen is soft.     Tenderness: There is no abdominal tenderness. There is no right CVA tenderness, left CVA tenderness, guarding or rebound.  Musculoskeletal:        General: Normal range of motion.     Cervical back: Normal range of motion.  Skin:    General: Skin is warm.     Capillary Refill: Capillary refill takes less than 2 seconds.     Findings: No rash.  Neurological:     Mental Status: She is alert and oriented to person, place, and time.     Sensory: No sensory deficit.     Motor: No weakness.     ED Results / Procedures / Treatments   Labs (all labs ordered are listed, but only abnormal results are displayed) Labs Reviewed - No data to display  EKG None  Radiology CT Renal Stone Study  Result Date: 04/14/2020 CLINICAL DATA:  Hematuria. EXAM: CT ABDOMEN AND PELVIS WITHOUT CONTRAST TECHNIQUE: Multidetector CT imaging of the abdomen and pelvis was performed following the standard protocol without IV contrast. COMPARISON:  None. FINDINGS: Lower chest: No acute abnormality. Hepatobiliary: No focal liver abnormality is seen. No gallstones, gallbladder wall thickening, or biliary dilatation. Pancreas: Unremarkable. No pancreatic ductal dilatation or surrounding inflammatory changes. Spleen: Normal in size without focal abnormality. Adrenals/Urinary Tract:  Adrenal glands are unremarkable. Kidneys are normal, without renal calculi, focal lesion, or hydronephrosis. The urinary bladder is partially contracted and otherwise normal in appearance. Stomach/Bowel: Stomach is within normal limits. Appendix appears normal. No evidence of bowel wall thickening, distention, or inflammatory changes. Vascular/Lymphatic: No significant vascular findings are present. Several mesenteric lymph nodes are seen within the posterior aspect of the mid and lower right abdomen (the largest measures approximately 1.5 cm). Reproductive: Uterus and bilateral adnexa are unremarkable. Other: No abdominal wall hernia or abnormality. No abdominopelvic ascites. Musculoskeletal: No acute or significant osseous findings. IMPRESSION: 1. Several mesenteric lymph nodes within the posterior aspect of the mid and lower right abdomen. While this may be reactive in nature, mild mesenteric adenitis cannot be excluded. 2. No evidence of renal calculi. Electronically Signed   By: Aram Candelahaddeus  Houston M.D.   On: 04/14/2020 19:29      Procedures Procedures (including critical care time)  Medications Ordered in ED Medications - No data to display  ED Course  I have reviewed the triage vital signs and the nursing notes.  Pertinent labs & imaging results that were available during my care of the patient were reviewed by me and considered in my medical decision making (see chart for details).    MDM Rules/Calculators/A&P                          Patient is well-appearing.  Nontoxic.  Vital signs reviewed.  She had a urine dipstick at urgent care earlier today that did not show evidence of a UTI.  Urine pregnancy test was negative.  She is here for further evaluation of her hematuria. No hx of kidney stones.  Abd is soft, NT on exam, Doubt acute abdominal process, TOA, and PID  CT renal stone study reassuring.  No evidence for stone.  Hematuria source is unclear, but felt to be secondary to onset of  menses.  Urine culture is pending.  I feel that pt is appropriate for d/c home,  Given return precautions and agrees to f/u with her OB/GYN    Final Clinical Impression(s) / ED Diagnoses Final diagnoses:  Hematuria, unspecified type    Rx / DC Orders ED Discharge Orders    None       Pauline Aus, PA-C 04/15/20 1440    Eber Hong, MD 04/16/20 1121

## 2020-04-14 NOTE — Discharge Instructions (Signed)
Your urine test today did not show evidence of a urinary tract infection.  You have a urine culture test pending.  The results should be back in several days.  Your CT this evening did not show evidence of a kidney stone.  You may take Tylenol every 4 hours if needed for discomfort.  I would recommend that you have your urine rechecked in 1 week.  Your primary care provider can check this for you.  Return to the emergency department if you develop worsening symptoms such as fever, abdominal pain, or vomiting.

## 2020-04-14 NOTE — ED Triage Notes (Addendum)
Pt here for hematuria that started yesterday. History of kidney stones previously.  Denies any pain.

## 2020-04-14 NOTE — ED Notes (Signed)
To CT

## 2020-04-14 NOTE — ED Provider Notes (Signed)
[facility]   CC: Burning with urination  SUBJECTIVE:  Kathy Rios is a 19 y.o. female who presented to the urgent care for complaint of painful urination and hematuria that started yesterday.  Denies a precipitating event, recent sexual encounter, excessive caffeine intake.  Has tried OTC medications without relief.  Symptoms are made worse with urination.  Admits to similar symptoms in the past.  Denies fever, chills, nausea, vomiting, abdominal pain, flank pain, abnormal vaginal discharge or bleeding, hematuria.    LMP: Patient's last menstrual period was 04/14/2020.  ROS: As in HPI.  All other pertinent ROS negative.     Past Medical History:  Diagnosis Date  . ADD (attention deficit disorder)   . ADHD (attention deficit hyperactivity disorder)   . Anemia   . Anxiety   . Depression   . Diabetes mellitus   . Diabetes mellitus, type II (HCC)   . Encounter for menstrual regulation 01/25/2016  . Hypertension   . Irregular periods 10/25/2015  . Obesity   . PMS (premenstrual syndrome) 11/05/2015  . Reactive hypoglycemia   . Severe menstrual cramps 11/05/2015  . Thalassemia trait   . Thalassemia trait, alpha    Past Surgical History:  Procedure Laterality Date  . ADENOIDECTOMY    . TONSILLECTOMY    . WISDOM TOOTH EXTRACTION  07/2016   Allergies  Allergen Reactions  . Selenium Sulfide Rash  . Motrin [Ibuprofen] Hives and Itching  . Sulfa Antibiotics Rash   No current facility-administered medications on file prior to encounter.   Current Outpatient Medications on File Prior to Encounter  Medication Sig Dispense Refill  . acetaminophen (TYLENOL) 500 MG tablet Take 1,000 mg by mouth every 6 (six) hours as needed for mild pain or headache.    . cetirizine (ZYRTEC) 10 MG tablet Take 10 mg by mouth as needed.     Marland Kitchen escitalopram (LEXAPRO) 20 MG tablet Take 1 tablet (20 mg total) by mouth 2 (two) times daily. 60 tablet 2  . linaclotide (LINZESS) 145 MCG CAPS capsule  Take 1 capsule (145 mcg total) by mouth daily before breakfast. (Patient taking differently: Take 145 mcg by mouth daily as needed (for constipation). ) 30 capsule 12  . lisinopril (PRINIVIL,ZESTRIL) 10 MG tablet Take 10 mg by mouth daily.     . Melatonin 5 MG TABS Take 2 tablets by mouth at bedtime as needed (sleep).     . metFORMIN (GLUCOPHAGE) 500 MG tablet Take 500 mg by mouth 2 (two) times daily with a meal.      . Multiple Vitamin (MULTIVITAMIN WITH MINERALS) TABS tablet Take 1 tablet by mouth daily.    . Omega-3 Fatty Acids (FISH OIL PO) Take 1,000 mg by mouth daily.     . valACYclovir (VALTREX) 1000 MG tablet Take 2 tablets by mouth 2 (two) times daily as needed (cold sores).   1   Social History   Socioeconomic History  . Marital status: Single    Spouse name: Not on file  . Number of children: Not on file  . Years of education: Not on file  . Highest education level: Not on file  Occupational History  . Not on file  Tobacco Use  . Smoking status: Never Smoker  . Smokeless tobacco: Never Used  Vaping Use  . Vaping Use: Never used  Substance and Sexual Activity  . Alcohol use: No  . Drug use: No  . Sexual activity: Never    Birth control/protection: Abstinence  Other Topics  Concern  . Not on file  Social History Narrative  . Not on file   Social Determinants of Health   Financial Resource Strain:   . Difficulty of Paying Living Expenses: Not on file  Food Insecurity:   . Worried About Programme researcher, broadcasting/film/video in the Last Year: Not on file  . Ran Out of Food in the Last Year: Not on file  Transportation Needs:   . Lack of Transportation (Medical): Not on file  . Lack of Transportation (Non-Medical): Not on file  Physical Activity:   . Days of Exercise per Week: Not on file  . Minutes of Exercise per Session: Not on file  Stress:   . Feeling of Stress : Not on file  Social Connections:   . Frequency of Communication with Friends and Family: Not on file  . Frequency  of Social Gatherings with Friends and Family: Not on file  . Attends Religious Services: Not on file  . Active Member of Clubs or Organizations: Not on file  . Attends Banker Meetings: Not on file  . Marital Status: Not on file  Intimate Partner Violence:   . Fear of Current or Ex-Partner: Not on file  . Emotionally Abused: Not on file  . Physically Abused: Not on file  . Sexually Abused: Not on file   Family History  Problem Relation Age of Onset  . Seizures Mother   . Arthritis Mother   . Diabetes Mother   . Hyperlipidemia Mother   . Depression Mother   . Hyperlipidemia Father   . Hypertension Father   . Gout Father   . Dementia Father   . Hypertension Sister   . Mental illness Brother   . Hyperlipidemia Brother   . ADD / ADHD Brother   . Depression Brother   . Hypertension Maternal Grandmother   . Cancer Maternal Grandmother   . Hypertension Maternal Grandfather   . Thyroid disease Maternal Grandfather   . Anemia Paternal Grandmother   . Cancer Paternal Grandmother        cervical  . Hypertension Paternal Grandfather   . Heart disease Paternal Grandfather   . Hypertension Other   . Other Other        heart skips-maternal great grandma  . Other Other        fibroids- maternal grandma and great grandma  . Heart disease Other   . Schizophrenia Maternal Aunt   . Bipolar disorder Maternal Aunt   . Alcohol abuse Maternal Uncle     OBJECTIVE:  Vitals:   04/14/20 1546  BP: 107/75  Pulse: 90  Resp: 16  Temp: 98.3 F (36.8 C)  TempSrc: Oral  SpO2: 95%   General appearance: AOx3 in no acute distress HEENT: NCAT.  Oropharynx clear.  Lungs: clear to auscultation bilaterally without adventitious breath sounds Heart: regular rate and rhythm.  Radial pulses 2+ symmetrical bilaterally Abdomen: soft; non-distended; no tenderness; bowel sounds present; no guarding or rebound tenderness Back: no CVA tenderness Extremities: no edema; symmetrical with no  gross deformities Skin: warm and dry Neurologic: Ambulates from chair to exam table without difficulty Psychological: alert and cooperative; normal mood and affect  Labs Reviewed  POCT URINALYSIS DIP (MANUAL ENTRY) - Abnormal; Notable for the following components:      Result Value   Bilirubin, UA small (*)    Blood, UA large (*)    Protein Ur, POC =100 (*)    All other components within normal limits  URINE  CULTURE  POCT URINE PREGNANCY    ASSESSMENT & PLAN:  1. Dysuria     No orders of the defined types were placed in this encounter.  Discharge Instructions Urine culture sent.  We will call you with the results.   Push fluids and get plenty of rest.   Follow up with PCP if symptoms persists Return here or go to ER if you have any new or worsening symptoms such as fever, worsening abdominal pain, nausea/vomiting, flank pain, etc...  Outlined signs and symptoms indicating need for more acute intervention. Patient verbalized understanding. After Visit Summary given.    Note: This document was prepared using Dragon voice recognition software and may include unintentional dictation errors.    Durward Parcel, FNP 04/14/20 1643

## 2020-04-14 NOTE — ED Notes (Signed)
Pt here for evaluation of hematuria   Specimen to lab   Reports a history of kidney stones   Awaiting CT renal

## 2020-04-14 NOTE — Discharge Instructions (Addendum)
Urine culture sent.  We will call you with the results.   Push fluids and get plenty of rest.   Follow up with PCP if symptoms persists Return here or go to ER if you have any new or worsening symptoms such as fever, worsening abdominal pain, nausea/vomiting, flank pain, etc... 

## 2020-04-15 LAB — URINE CULTURE: Culture: <10000 — AB

## 2020-06-25 ENCOUNTER — Telehealth (HOSPITAL_COMMUNITY): Payer: Self-pay | Admitting: Psychiatry

## 2020-06-25 NOTE — Telephone Encounter (Signed)
Called to schedule f/u appt, left vm 

## 2020-08-08 ENCOUNTER — Other Ambulatory Visit: Payer: Self-pay

## 2020-08-08 ENCOUNTER — Ambulatory Visit (INDEPENDENT_AMBULATORY_CARE_PROVIDER_SITE_OTHER): Payer: Medicaid Other | Admitting: Adult Health

## 2020-08-08 ENCOUNTER — Encounter: Payer: Self-pay | Admitting: Adult Health

## 2020-08-08 VITALS — BP 124/67 | HR 77 | Ht 65.0 in | Wt 287.2 lb

## 2020-08-08 DIAGNOSIS — Z113 Encounter for screening for infections with a predominantly sexual mode of transmission: Secondary | ICD-10-CM | POA: Diagnosis not present

## 2020-08-08 DIAGNOSIS — E282 Polycystic ovarian syndrome: Secondary | ICD-10-CM

## 2020-08-08 DIAGNOSIS — R102 Pelvic and perineal pain: Secondary | ICD-10-CM | POA: Diagnosis not present

## 2020-08-08 MED ORDER — NORETHIN ACE-ETH ESTRAD-FE 1-20 MG-MCG(24) PO TABS: 1.0000 | ORAL_TABLET | Freq: Every day | ORAL | 11 refills | Status: DC

## 2020-08-08 NOTE — Progress Notes (Signed)
  Subjective:     Patient ID: Kathy Rios, female   DOB: [birth year], 19 y.o.   MRN: [id number]  HPI Kathy Rios is a 19 year old black female,single, G0P0 in complaining of cramping for last 3 cycles, about 2 weeks before a period esp in low back and has PMS, she says she has PCO. [facility]CP is [facility]   Review of Systems +cramping +PMS Not having sex Reviewed past medical,surgical, social and family history. Reviewed medications and allergies.     Objective:   Physical Exam BP 124/67 (BP Location: Right Arm, Patient Position: Sitting, Cuff Size: Large)   Pulse 77   Ht 5\' 5"  (1.651 m)   Wt 287 lb 3.2 oz (130.3 kg)   LMP 07/25/2020 (Exact Date)   BMI 47.79 kg/m  Skin warm and dry. Neck: mid line trachea, normal thyroid, good ROM, no lymphadenopathy noted. Lungs: clear to ausculation bilaterally. Cardiovascular: regular rate and rhythm.  Upstream - 08/08/20 1442      Pregnancy Intention Screening   Does the patient want to become pregnant in the next year? No    Does the patient's partner want to become pregnant in the next year? No    Would the patient like to discuss contraceptive options today? Yes      Contraception Wrap Up   Current Method Abstinence    End Method Abstinence;Oral Contraceptive    Contraception Counseling Provided Yes             Assessment:     1. Pelvic cramping GYN 08/10/20 in about 3 weeks then see me in about 2 weeks for pap and physical  Check CBC,CMP and TSH  Will try OCs  Meds ordered this encounter  Medications  . Norethindrone Acetate-Ethinyl Estrad-FE (LOESTRIN 24 FE) 1-20 MG-MCG(24) tablet    Sig: Take 1 tablet by mouth daily.    Dispense:  28 tablet    Refill:  11    Order Specific Question:   Supervising Provider    Answer:   [place] H [[id number]]   2. PCO (polycystic ovaries) GYN Duane Lope in about 3 weeks   3. Screening examination for STD (sexually transmitted disease) Check HIV,RPR and hepatitis C antibody    Plan:     Start  OCs Will get [place] to assess uterus and ovaries Pap and physical in January

## 2020-08-09 LAB — COMPREHENSIVE METABOLIC PANEL
ALT: 13 IU/L (ref 0–32)
AST: 16 IU/L (ref 0–40)
Albumin/Globulin Ratio: 1.1 — ABNORMAL LOW (ref 1.2–2.2)
Albumin: 4.3 g/dL (ref 3.9–5.0)
Alkaline Phosphatase: 110 IU/L — ABNORMAL HIGH (ref 42–106)
BUN/Creatinine Ratio: 10 (ref 9–23)
BUN: 7 mg/dL (ref 6–20)
Bilirubin Total: 0.3 mg/dL (ref 0.0–1.2)
CO2: 23 mmol/L (ref 20–29)
Calcium: 9.9 mg/dL (ref 8.7–10.2)
Chloride: 102 mmol/L (ref 96–106)
Creatinine, Ser: 0.67 mg/dL (ref 0.57–1.00)
GFR calc Af Amer: 146 mL/min/{1.73_m2} (ref >59)
GFR calc non Af Amer: 127 mL/min/{1.73_m2} (ref >59)
Globulin, Total: 3.8 g/dL (ref 1.5–4.5)
Glucose: 80 mg/dL (ref 65–99)
Potassium: 4.5 mmol/L (ref 3.5–5.2)
Sodium: 138 mmol/L (ref 134–144)
Total Protein: 8.1 g/dL (ref 6.0–8.5)

## 2020-08-09 LAB — CBC
Hematocrit: 35.9 % (ref 34.0–46.6)
Hemoglobin: 11.1 g/dL (ref 11.1–15.9)
MCH: 20.3 pg — ABNORMAL LOW (ref 26.6–33.0)
MCHC: 30.9 g/dL — ABNORMAL LOW (ref 31.5–35.7)
MCV: 66 fL — ABNORMAL LOW (ref 79–97)
Platelets: 417 10*3/uL (ref 150–450)
RBC: 5.48 x10E6/uL — ABNORMAL HIGH (ref 3.77–5.28)
RDW: 18.8 % — ABNORMAL HIGH (ref 11.7–15.4)
WBC: 7.5 10*3/uL (ref 3.4–10.8)

## 2020-08-09 LAB — HIV ANTIBODY (ROUTINE TESTING W REFLEX): HIV Screen 4th Generation wRfx: NONREACTIVE

## 2020-08-09 LAB — RPR: RPR Ser Ql: NONREACTIVE

## 2020-08-09 LAB — TSH: TSH: 1.85 u[IU]/mL (ref 0.450–4.500)

## 2020-08-09 LAB — HEPATITIS C ANTIBODY: Hep C Virus Ab: <0.1 s/co ratio (ref 0.0–0.9)

## 2020-08-31 ENCOUNTER — Other Ambulatory Visit: Payer: Medicaid Other

## 2020-09-03 ENCOUNTER — Other Ambulatory Visit: Payer: Medicaid Other

## 2020-09-06 ENCOUNTER — Other Ambulatory Visit (HOSPITAL_COMMUNITY): Payer: Self-pay | Admitting: Psychiatry

## 2020-09-06 NOTE — Telephone Encounter (Signed)
Call for appt

## 2020-09-07 ENCOUNTER — Other Ambulatory Visit: Payer: Medicaid Other | Admitting: Adult Health

## 2020-09-10 NOTE — Telephone Encounter (Signed)
LMOM

## 2020-09-19 ENCOUNTER — Ambulatory Visit
Admission: EM | Admit: 2020-09-19 | Discharge: 2020-09-19 | Disposition: A | Discharge status: Home / Self Care | Payer: Medicaid Other | Attending: Emergency Medicine | Admitting: Emergency Medicine

## 2020-09-19 ENCOUNTER — Other Ambulatory Visit: Payer: Medicaid Other

## 2020-09-19 ENCOUNTER — Other Ambulatory Visit: Payer: Self-pay

## 2020-09-19 DIAGNOSIS — R300 Dysuria: Secondary | ICD-10-CM | POA: Insufficient documentation

## 2020-09-19 DIAGNOSIS — N390 Urinary tract infection, site not specified: Secondary | ICD-10-CM

## 2020-09-19 LAB — POCT URINALYSIS DIP (MANUAL ENTRY)
Bilirubin, UA: NEGATIVE
Glucose, UA: NEGATIVE mg/dL
Ketones, POC UA: NEGATIVE mg/dL
Nitrite, UA: NEGATIVE
Protein Ur, POC: 30 mg/dL — AB
Spec Grav, UA: 1.025 (ref 1.010–1.025)
Urobilinogen, UA: 0.2 E.U./dL
pH, UA: 6.5 (ref 5.0–8.0)

## 2020-09-19 MED ORDER — ONDANSETRON HCL 4 MG PO TABS: 4.0000 mg | ORAL_TABLET | Freq: Three times a day (TID) | ORAL | 0 refills | Status: DC | PRN

## 2020-09-19 MED ORDER — NITROFURANTOIN MONOHYD MACRO 100 MG PO CAPS: 100.0000 mg | ORAL_CAPSULE | Freq: Two times a day (BID) | ORAL | 0 refills | Status: DC

## 2020-09-19 NOTE — ED Provider Notes (Signed)
[facility]   CC: Burning with urination  SUBJECTIVE:  Kathy Rios is a 20 y.o. female who presents to the urgent care with a complaint of dysuria for the past 3 days.  Patient denies a precipitating event.  Denies flank/abdominal pain.  Has tried OTC medications without relief.  Symptoms are made worse with urination.  Admits to similar symptoms in the past.  Denies fever, chills, nausea, vomiting, abdominal pain, flank pain, abnormal vaginal discharge or bleeding, hematuria.    LMP: Patient's last menstrual period was 09/06/2020.  ROS: As in HPI.  All other pertinent ROS negative.     Past Medical History:  Diagnosis Date  . ADD (attention deficit disorder)   . ADHD (attention deficit hyperactivity disorder)   . Anemia   . Anxiety   . Depression   . Diabetes mellitus   . Diabetes mellitus, type II ([place])   . Encounter for menstrual regulation 01/25/2016  . Hypertension   . Irregular periods 10/25/2015  . Obesity   . PMS (premenstrual syndrome) 11/05/2015  . Reactive hypoglycemia   . Severe menstrual cramps 11/05/2015  . Thalassemia trait   . Thalassemia trait, alpha    Past Surgical History:  Procedure Laterality Date  . ADENOIDECTOMY    . TONSILLECTOMY    . WISDOM TOOTH EXTRACTION  07/2016   Allergies  Allergen Reactions  . Selenium Sulfide Rash  . Motrin [Ibuprofen] Hives and Itching  . Sulfa Antibiotics Rash   No current facility-administered medications on file prior to encounter.   Current Outpatient Medications on File Prior to Encounter  Medication Sig Dispense Refill  . acetaminophen (TYLENOL) 500 MG tablet Take 1,000 mg by mouth every 6 (six) hours as needed for mild pain or headache.    . cetirizine (ZYRTEC) 10 MG tablet Take 10 mg by mouth as needed.     Marland Kitchen escitalopram (LEXAPRO) 20 MG tablet TAKE 1 TABLET(20 MG) BY MOUTH TWICE DAILY 60 tablet 2  . glucose blood (PRECISION QID TEST) test strip Use 1-2 times daily as instructed. For Accuchek guide  glucometer    . Lancets Misc. (ACCU-CHEK FASTCLIX LANCET) KIT once daily Use as instructed.    . linaclotide (LINZESS) 145 MCG CAPS capsule Take 1 capsule (145 mcg total) by mouth daily before breakfast. (Patient taking differently: Take 145 mcg by mouth daily as needed (for constipation).) 30 capsule 12  . lisinopril (PRINIVIL,ZESTRIL) 10 MG tablet Take 10 mg by mouth daily.     . Melatonin 5 MG TABS Take 2 tablets by mouth at bedtime as needed (sleep).    . metFORMIN (GLUCOPHAGE) 500 MG tablet Take 500 mg by mouth 2 (two) times daily with a meal.    . Multiple Vitamin (MULTIVITAMIN WITH MINERALS) TABS tablet Take 1 tablet by mouth daily.    . Norethindrone Acetate-Ethinyl Estrad-FE (LOESTRIN 24 FE) 1-20 MG-MCG(24) tablet Take 1 tablet by mouth daily. 28 tablet 11  . Omega-3 Fatty Acids (FISH OIL PO) Take 1,000 mg by mouth daily.     . valACYclovir (VALTREX) 1000 MG tablet Take 2 tablets by mouth 2 (two) times daily as needed (cold sores).   1   Social History   Socioeconomic History  . Marital status: Single    Spouse name: Not on file  . Number of children: Not on file  . Years of education: Not on file  . Highest education level: Not on file  Occupational History  . Not on file  Tobacco Use  . Smoking  status: Never Smoker  . Smokeless tobacco: Never Used  Vaping Use  . Vaping Use: Never used  Substance and Sexual Activity  . Alcohol use: No  . Drug use: No  . Sexual activity: Not Currently    Birth control/protection: Abstinence  Other Topics Concern  . Not on file  Social History Narrative  . Not on file   Social Determinants of Health   Financial Resource Strain: Not on file  Food Insecurity: Not on file  Transportation Needs: Not on file  Physical Activity: Not on file  Stress: Not on file  Social Connections: Not on file  Intimate Partner Violence: Not on file   Family History  Problem Relation Age of Onset  . Seizures Mother   . Arthritis Mother   . Diabetes  Mother   . Hyperlipidemia Mother   . Depression Mother   . Hyperlipidemia Father   . Hypertension Father   . Gout Father   . Dementia Father   . Hypertension Sister   . Mental illness Brother   . Hyperlipidemia Brother   . ADD / ADHD Brother   . Depression Brother   . Hypertension Maternal Grandmother   . Cancer Maternal Grandmother   . Hypertension Maternal Grandfather   . Thyroid disease Maternal Grandfather   . Cancer Maternal Grandfather   . Prostate cancer Maternal Grandfather   . Anemia Paternal Grandmother   . Cancer Paternal Grandmother        cervical  . Hypertension Paternal Grandfather   . Heart disease Paternal Grandfather   . Hypertension Other   . Other Other        heart skips-maternal great grandma  . Other Other        fibroids- maternal grandma and great grandma  . Heart disease Other   . Schizophrenia Maternal Aunt   . Bipolar disorder Maternal Aunt   . Alcohol abuse Maternal Uncle     OBJECTIVE:  Vitals:   09/19/20 1446  BP: 110/78  Pulse: 76  Resp: 18  Temp: 98.3 F (36.8 C)  SpO2: 96%   General appearance: AOx3 in no acute distress HEENT: NCAT.  Oropharynx clear.  Lungs: clear to auscultation bilaterally without adventitious breath sounds Heart: regular rate and rhythm.  Radial pulses 2+ symmetrical bilaterally Abdomen: soft; non-distended; no tenderness; bowel sounds present; no guarding or rebound tenderness Back: no CVA tenderness Extremities: no edema; symmetrical with no gross deformities Skin: warm and dry Neurologic: Ambulates from chair to exam table without difficulty Psychological: alert and cooperative; normal mood and affect  Labs Reviewed  POCT URINALYSIS DIP (MANUAL ENTRY) - Abnormal; Notable for the following components:      Result Value   Clarity, UA cloudy (*)    Blood, UA large (*)    Protein Ur, POC =30 (*)    Leukocytes, UA Small (1+) (*)    All other components within normal limits  URINE CULTURE     ASSESSMENT & PLAN:  1. Dysuria   2. Acute UTI     Meds ordered this encounter  Medications  . nitrofurantoin, macrocrystal-monohydrate, (MACROBID) 100 MG capsule    Sig: Take 1 capsule (100 mg total) by mouth 2 (two) times daily.    Dispense:  10 capsule    Refill:  0   Discharge Instructions  Urine culture sent.  We will call you with the results.   Push fluids and get plenty of rest.   Take antibiotic as directed and to completion Follow  up with PCP if symptoms persists Return here or go to ER if you have any new or worsening symptoms such as fever, worsening abdominal pain, nausea/vomiting, flank pain, etc...  Outlined signs and symptoms indicating need for more acute intervention. Patient verbalized understanding. After Visit Summary given.     Emerson Monte, [place] 09/19/20 1504

## 2020-09-19 NOTE — Discharge Instructions (Signed)
Urine culture sent.  We will call you with the results.   Push fluids and get plenty of rest.   Take antibiotic as directed and to completion Follow up with PCP if symptoms persists Return here or go to ER if you have any new or worsening symptoms such as fever, worsening abdominal pain, nausea/vomiting, flank pain, etc... 

## 2020-09-19 NOTE — ED Triage Notes (Signed)
Pt presents with c/o dysuria for past 3 days 

## 2020-09-20 ENCOUNTER — Other Ambulatory Visit: Payer: Medicaid Other | Admitting: Adult Health

## 2020-09-21 LAB — URINE CULTURE: Culture: 20000 — AB

## 2020-10-02 ENCOUNTER — Other Ambulatory Visit: Payer: Medicaid Other

## 2020-10-02 ENCOUNTER — Ambulatory Visit (INDEPENDENT_AMBULATORY_CARE_PROVIDER_SITE_OTHER): Payer: Medicaid Other

## 2020-10-02 ENCOUNTER — Other Ambulatory Visit: Payer: Self-pay

## 2020-10-02 DIAGNOSIS — E282 Polycystic ovarian syndrome: Secondary | ICD-10-CM

## 2020-10-02 DIAGNOSIS — R102 Pelvic and perineal pain: Secondary | ICD-10-CM | POA: Diagnosis not present

## 2020-10-02 NOTE — Progress Notes (Signed)
PELVIC US TA/TV:homogeneous anteverted subseptate uterus,wnl,EEC .70mm,normal ovaries,ovaries appear mobile,no free fluid

## 2020-10-05 ENCOUNTER — Other Ambulatory Visit: Payer: Medicaid Other | Admitting: Adult Health

## 2020-10-05 ENCOUNTER — Telehealth: Payer: Self-pay | Admitting: Adult Health

## 2020-10-05 NOTE — Telephone Encounter (Signed)
Called pt she missed appt today, told her [place] results and need to check some labs, she will reschedule appt

## 2020-10-10 ENCOUNTER — Telehealth: Payer: Self-pay

## 2020-10-10 NOTE — Telephone Encounter (Signed)
Pt is requesting a reschedule for the appointment that she had on 10/05/20 which was r/s from 09/20/20 and 09/07/20,

## 2020-10-21 ENCOUNTER — Emergency Department (HOSPITAL_COMMUNITY)
Admission: EM | Admit: 2020-10-21 | Discharge: 2020-10-21 | Disposition: A | Discharge status: Home / Self Care | Payer: Medicaid Other | Attending: Emergency Medicine | Admitting: Emergency Medicine

## 2020-10-21 ENCOUNTER — Encounter (HOSPITAL_COMMUNITY): Payer: Self-pay | Admitting: Emergency Medicine

## 2020-10-21 ENCOUNTER — Other Ambulatory Visit: Payer: Self-pay

## 2020-10-21 DIAGNOSIS — Z8742 Personal history of other diseases of the female genital tract: Secondary | ICD-10-CM | POA: Diagnosis not present

## 2020-10-21 DIAGNOSIS — I10 Essential (primary) hypertension: Secondary | ICD-10-CM | POA: Insufficient documentation

## 2020-10-21 DIAGNOSIS — L292 Pruritus vulvae: Secondary | ICD-10-CM | POA: Diagnosis not present

## 2020-10-21 DIAGNOSIS — E119 Type 2 diabetes mellitus without complications: Secondary | ICD-10-CM | POA: Insufficient documentation

## 2020-10-21 DIAGNOSIS — N898 Other specified noninflammatory disorders of vagina: Secondary | ICD-10-CM | POA: Diagnosis not present

## 2020-10-21 DIAGNOSIS — Z79899 Other long term (current) drug therapy: Secondary | ICD-10-CM | POA: Diagnosis not present

## 2020-10-21 LAB — URINALYSIS, ROUTINE W REFLEX MICROSCOPIC
Bacteria, UA: NONE SEEN
Bilirubin Urine: NEGATIVE
Glucose, UA: NEGATIVE mg/dL
Hgb urine dipstick: NEGATIVE
Ketones, ur: NEGATIVE mg/dL
Nitrite: NEGATIVE
Protein, ur: 30 mg/dL — AB
Specific Gravity, Urine: 1.026 (ref 1.005–1.030)
WBC, UA: >50 WBC/hpf — ABNORMAL HIGH (ref 0–5)
pH: 6 (ref 5.0–8.0)

## 2020-10-21 LAB — WET PREP, GENITAL
Clue Cells Wet Prep HPF POC: NONE SEEN
Sperm: NONE SEEN
Trich, Wet Prep: NONE SEEN
Yeast Wet Prep HPF POC: NONE SEEN

## 2020-10-21 LAB — POC URINE PREG, ED: Preg Test, Ur: NEGATIVE

## 2020-10-21 MED ORDER — CEFTRIAXONE SODIUM 500 MG IJ SOLR
500.0000 mg | Freq: Once | INTRAMUSCULAR | Status: AC
  Administered 2020-10-21: 500 mg via INTRAMUSCULAR
  Filled 2020-10-21: qty 500

## 2020-10-21 MED ORDER — DOXYCYCLINE HYCLATE 100 MG PO TABS
100.0000 mg | ORAL_TABLET | Freq: Once | ORAL | Status: AC
  Administered 2020-10-21: 100 mg via ORAL
  Filled 2020-10-21: qty 1

## 2020-10-21 MED ORDER — DOXYCYCLINE HYCLATE 100 MG PO CAPS: 100.0000 mg | ORAL_CAPSULE | Freq: Two times a day (BID) | ORAL | 0 refills | Status: DC

## 2020-10-21 NOTE — ED Notes (Signed)
Pelvic at bedside   

## 2020-10-21 NOTE — ED Provider Notes (Signed)
[facility], [place] EMERGENCY DEPARTMENT Provider Note   CSN: [id number] Arrival date & time: 10/21/20  0048     History Chief Complaint  Patient presents with  . Vaginal Discharge    Kathy Rios is a 20 y.o. female.  The history is provided by the patient.  Vaginal Discharge Quality:  Brown, yellow, thick and white Severity:  Moderate Duration:  4 days Timing:  Constant Chronicity:  New Context: not after intercourse and not at rest   Relieved by:  None tried Worsened by:  Nothing Ineffective treatments:  None tried      Past Medical History:  Diagnosis Date  . ADD (attention deficit disorder)   . ADHD (attention deficit hyperactivity disorder)   . Anemia   . Anxiety   . Depression   . Diabetes mellitus   . Diabetes mellitus, type II ([place])   . Encounter for menstrual regulation 01/25/2016  . Hypertension   . Irregular periods 10/25/2015  . Obesity   . PMS (premenstrual syndrome) 11/05/2015  . Reactive hypoglycemia   . Severe menstrual cramps 11/05/2015  . Thalassemia trait   . Thalassemia trait, alpha     Patient Active Problem List   Diagnosis Date Noted  . Constipation 03/08/2018  . Acanthosis nigricans 01/28/2017  . Prediabetes 07/01/2016  . Encounter for menstrual regulation 01/25/2016  . Severe menstrual cramps 11/05/2015  . PMS (premenstrual syndrome) 11/05/2015  . Irregular periods 10/25/2015  . 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 Tinea versicolor 12/23/2014  . PCOS (polycystic ovarian syndrome) 11/13/2014  . Alpha thalassemia trait 08/20/2012  . Hypertension 08/20/2012    Past Surgical History:  Procedure Laterality Date  . ADENOIDECTOMY    . TONSILLECTOMY    . WISDOM TOOTH EXTRACTION  07/2016     OB History    Gravida  0   Para  0   Term  0   Preterm  0   AB  0   Living  0     SAB  0   IAB  0   Ectopic  0   Multiple  0   Live Births              Family History   Problem Relation Age of Onset  . Seizures Mother   . Arthritis Mother   . Diabetes Mother   . Hyperlipidemia Mother   . Depression Mother   . Hyperlipidemia Father   . Hypertension Father   . Gout Father   . Dementia Father   . Hypertension Sister   . Mental illness Brother   . Hyperlipidemia Brother   . ADD / ADHD Brother   . Depression Brother   . Hypertension Maternal Grandmother   . Cancer Maternal Grandmother   . Hypertension Maternal Grandfather   . Thyroid disease Maternal Grandfather   . Cancer Maternal Grandfather   . Prostate cancer Maternal Grandfather   . Anemia Paternal Grandmother   . Cancer Paternal Grandmother        cervical  . Hypertension Paternal Grandfather   . Heart disease Paternal Grandfather   . Hypertension Other   . Other Other        heart skips-maternal great grandma  . Other Other        fibroids- maternal grandma and great grandma  . Heart disease Other   . Schizophrenia Maternal Aunt   . Bipolar disorder Maternal Aunt   . Alcohol abuse Maternal Uncle     Social  History   Tobacco Use  . Smoking status: Never Smoker  . Smokeless tobacco: Never Used  Vaping Use  . Vaping Use: Never used  Substance Use Topics  . Alcohol use: No  . Drug use: No    Home Medications Prior to Admission medications   Medication Sig Start Date End Date Taking? Authorizing Provider  doxycycline (VIBRAMYCIN) 100 MG capsule Take 1 capsule (100 mg total) by mouth 2 (two) times daily. One po bid x 7 days 10/21/20  Yes Jerred Zaremba, Corene Cornea, MD  acetaminophen (TYLENOL) 500 MG tablet Take 1,000 mg by mouth every 6 (six) hours as needed for mild pain or headache.    [provider]  cetirizine (ZYRTEC) 10 MG tablet Take 10 mg by mouth as needed.     [provider]  escitalopram (LEXAPRO) 20 MG tablet TAKE 1 TABLET(20 MG) BY MOUTH TWICE DAILY 09/06/20   Cloria Spring, MD  glucose blood (PRECISION QID TEST) test strip Use 1-2 times daily as instructed.  For Accuchek guide glucometer 09/15/19   [provider]  Lancets Misc. (ACCU-CHEK FASTCLIX LANCET) KIT once daily Use as instructed. 07/06/20 07/06/21  [provider]  linaclotide (LINZESS) 145 MCG CAPS capsule Take 1 capsule (145 mcg total) by mouth daily before breakfast. Patient taking differently: Take 145 mcg by mouth daily as needed (for constipation). 03/08/18   Estill Dooms, NP  lisinopril (PRINIVIL,ZESTRIL) 10 MG tablet Take 10 mg by mouth daily.     [provider]  Melatonin 5 MG TABS Take 2 tablets by mouth at bedtime as needed (sleep).    [provider]  metFORMIN (GLUCOPHAGE) 500 MG tablet Take 500 mg by mouth 2 (two) times daily with a meal.    [provider]  Multiple Vitamin (MULTIVITAMIN WITH MINERALS) TABS tablet Take 1 tablet by mouth daily.    [provider]  nitrofurantoin, macrocrystal-monohydrate, (MACROBID) 100 MG capsule Take 1 capsule (100 mg total) by mouth 2 (two) times daily. 09/19/20   Avegno, Darrelyn Hillock, FNP  Norethindrone Acetate-Ethinyl Estrad-FE (LOESTRIN 24 FE) 1-20 MG-MCG(24) tablet Take 1 tablet by mouth daily. 08/08/20   Estill Dooms, NP  Omega-3 Fatty Acids (FISH OIL PO) Take 1,000 mg by mouth daily.     [provider]  ondansetron (ZOFRAN) 4 MG tablet Take 1 tablet (4 mg total) by mouth every 8 (eight) hours as needed for nausea or vomiting. 09/19/20   Avegno, Darrelyn Hillock, FNP  valACYclovir (VALTREX) 1000 MG tablet Take 2 tablets by mouth 2 (two) times daily as needed (cold sores).  09/22/15   [provider]    Allergies    Selenium sulfide, Motrin [ibuprofen], and Sulfa antibiotics  Review of Systems   Review of Systems  Genitourinary: Positive for vaginal discharge.  All other systems reviewed and are negative.   Physical Exam Updated Vital Signs BP 130/76 (BP Location: Right Arm)   Pulse 82   Temp 97.9 F (36.6 C) (Oral)   Resp 17   Ht 5' 5" (1.651 m)   Wt  130.3 kg   SpO2 100%   BMI 47.80 kg/m   Physical Exam Vitals and nursing note reviewed. Exam conducted with a chaperone present.  Constitutional:      Appearance: She is well-developed and well-nourished.  HENT:     Head: Normocephalic and atraumatic.     Nose: No congestion or rhinorrhea.     Mouth/Throat:     Mouth: Mucous membranes are moist.  Pharynx: Oropharynx is clear.  Eyes:     Pupils: Pupils are equal, round, and reactive to light.  Cardiovascular:     Rate and Rhythm: Normal rate and regular rhythm.  Pulmonary:     Effort: No respiratory distress.     Breath sounds: No stridor.  Abdominal:     General: Abdomen is flat. There is no distension.  Genitourinary:    Vagina: Normal.     Cervix: Discharge (mild milky) present. No cervical motion tenderness, friability or cervical bleeding.     Uterus: Normal.      Adnexa: Right adnexa normal and left adnexa normal.     Comments: Chaperoned by nurse Hala Musculoskeletal:        General: No swelling or tenderness. Normal range of motion.     Cervical back: Normal range of motion.  Skin:    General: Skin is warm and dry.     Coloration: Skin is not jaundiced or pale.  Neurological:     General: No focal deficit present.     Mental Status: She is alert.     ED Results / Procedures / Treatments   Labs (all labs ordered are listed, but only abnormal results are displayed) Labs Reviewed  WET PREP, GENITAL - Abnormal; Notable for the following components:      Result Value   WBC, Wet Prep HPF POC FEW (*)    All other components within normal limits  URINALYSIS, ROUTINE W REFLEX MICROSCOPIC - Abnormal; Notable for the following components:   APPearance CLOUDY (*)    Protein, ur 30 (*)    Leukocytes,Ua LARGE (*)    WBC, UA >50 (*)    All other components within normal limits  URINE CULTURE  RPR  POC URINE PREG, ED  GC/CHLAMYDIA PROBE AMP ([place]) NOT AT [facility]    EKG None  Radiology No results  found.  Procedures Procedures   Medications Ordered in ED Medications  cefTRIAXone (ROCEPHIN) injection 500 mg (500 mg Intramuscular Given 10/21/20 0416)  doxycycline (VIBRA-TABS) tablet 100 mg (100 mg Oral Given 10/21/20 0416)    ED Course  I have reviewed the triage vital signs and the nursing notes.  Pertinent labs & imaging results that were available during my care of the patient were reviewed by me and considered in my medical decision making (see chart for details).    MDM Rules/Calculators/A&P                          Labs ok. Treat for STD.   Final Clinical Impression(s) / ED Diagnoses Final diagnoses:  Vaginal discharge  Vaginal itching    Rx / DC Orders ED Discharge Orders         Ordered    doxycycline (VIBRAMYCIN) 100 MG capsule  2 times daily        10/21/20 0407           , Corene Cornea, MD 10/21/20 [phone]

## 2020-10-21 NOTE — ED Triage Notes (Signed)
Pt concerned that she may have a yeast infection or STD. Pt states she hasn't been in contact with anyone that she knows of that has a STD.

## 2020-10-22 LAB — URINE CULTURE: Culture: >=100000 — AB

## 2020-10-22 LAB — GC/CHLAMYDIA PROBE AMP (~~LOC~~) NOT AT ARMC
Chlamydia: NEGATIVE
Comment: NEGATIVE
Comment: NORMAL
Neisseria Gonorrhea: NEGATIVE

## 2020-10-22 LAB — RPR: RPR Ser Ql: NONREACTIVE

## 2020-12-12 ENCOUNTER — Emergency Department (HOSPITAL_COMMUNITY)
Admission: EM | Admit: 2020-12-12 | Discharge: 2020-12-12 | Disposition: A | Discharge status: Home / Self Care | Payer: Medicaid Other | Attending: Emergency Medicine | Admitting: Emergency Medicine

## 2020-12-12 ENCOUNTER — Encounter (HOSPITAL_COMMUNITY): Payer: Self-pay | Admitting: *Deleted

## 2020-12-12 ENCOUNTER — Other Ambulatory Visit: Payer: Self-pay

## 2020-12-12 DIAGNOSIS — E119 Type 2 diabetes mellitus without complications: Secondary | ICD-10-CM | POA: Insufficient documentation

## 2020-12-12 DIAGNOSIS — S20211A Contusion of right front wall of thorax, initial encounter: Secondary | ICD-10-CM | POA: Insufficient documentation

## 2020-12-12 DIAGNOSIS — S0501XA Injury of conjunctiva and corneal abrasion without foreign body, right eye, initial encounter: Secondary | ICD-10-CM | POA: Insufficient documentation

## 2020-12-12 DIAGNOSIS — Z7984 Long term (current) use of oral hypoglycemic drugs: Secondary | ICD-10-CM | POA: Diagnosis not present

## 2020-12-12 DIAGNOSIS — I10 Essential (primary) hypertension: Secondary | ICD-10-CM | POA: Insufficient documentation

## 2020-12-12 DIAGNOSIS — M25511 Pain in right shoulder: Secondary | ICD-10-CM | POA: Diagnosis not present

## 2020-12-12 DIAGNOSIS — H1133 Conjunctival hemorrhage, bilateral: Secondary | ICD-10-CM

## 2020-12-12 DIAGNOSIS — S0502XA Injury of conjunctiva and corneal abrasion without foreign body, left eye, initial encounter: Secondary | ICD-10-CM | POA: Insufficient documentation

## 2020-12-12 DIAGNOSIS — S299XXA Unspecified injury of thorax, initial encounter: Secondary | ICD-10-CM | POA: Diagnosis present

## 2020-12-12 DIAGNOSIS — M545 Low back pain, unspecified: Secondary | ICD-10-CM | POA: Insufficient documentation

## 2020-12-12 DIAGNOSIS — Z79899 Other long term (current) drug therapy: Secondary | ICD-10-CM | POA: Insufficient documentation

## 2020-12-12 MED ORDER — METHOCARBAMOL 500 MG PO TABS: 500.0000 mg | ORAL_TABLET | Freq: Three times a day (TID) | ORAL | 0 refills | Status: DC | PRN
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
## 2020-12-12 NOTE — ED Notes (Signed)
Subconjunctival hemorrhage noted to bilateral eyes.
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
## 2020-12-24 ENCOUNTER — Other Ambulatory Visit: Payer: Medicaid Other | Admitting: Adult Health

## 2020-12-26 ENCOUNTER — Other Ambulatory Visit: Payer: Self-pay

## 2020-12-26 ENCOUNTER — Ambulatory Visit (INDEPENDENT_AMBULATORY_CARE_PROVIDER_SITE_OTHER): Payer: Medicaid Other | Admitting: Advanced Practice Midwife

## 2020-12-26 ENCOUNTER — Other Ambulatory Visit (HOSPITAL_COMMUNITY)
Admission: RE | Admit: 2020-12-26 | Discharge: 2020-12-26 | Disposition: A | Discharge status: Home / Self Care | Payer: Medicaid Other | Source: Ambulatory Visit | Attending: Advanced Practice Midwife | Admitting: Advanced Practice Midwife

## 2020-12-26 ENCOUNTER — Encounter: Payer: Self-pay | Admitting: Advanced Practice Midwife

## 2020-12-26 VITALS — BP 115/66 | HR 76 | Ht 65.0 in | Wt 296.0 lb

## 2020-12-26 DIAGNOSIS — Z113 Encounter for screening for infections with a predominantly sexual mode of transmission: Secondary | ICD-10-CM

## 2020-12-26 DIAGNOSIS — Z3202 Encounter for pregnancy test, result negative: Secondary | ICD-10-CM

## 2020-12-26 DIAGNOSIS — N920 Excessive and frequent menstruation with regular cycle: Secondary | ICD-10-CM | POA: Diagnosis not present

## 2020-12-26 LAB — POCT URINE PREGNANCY: Preg Test, Ur: NEGATIVE
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
## 2020-12-27 LAB — CBC
Hematocrit: 34.2 % (ref 34.0–46.6)
Hemoglobin: 10.5 g/dL — ABNORMAL LOW (ref 11.1–15.9)
MCH: 20.3 pg — ABNORMAL LOW (ref 26.6–33.0)
MCHC: 30.7 g/dL — ABNORMAL LOW (ref 31.5–35.7)
MCV: 66 fL — ABNORMAL LOW (ref 79–97)
Platelets: 394 10*3/uL (ref 150–450)
RBC: 5.17 x10E6/uL (ref 3.77–5.28)
RDW: 17 % — ABNORMAL HIGH (ref 11.7–15.4)
WBC: 7.1 10*3/uL (ref 3.4–10.8)

## 2020-12-28 LAB — CERVICOVAGINAL ANCILLARY ONLY
Chlamydia: NEGATIVE
Comment: NEGATIVE
Comment: NORMAL
Neisseria Gonorrhea: NEGATIVE

## 2021-02-02 ENCOUNTER — Other Ambulatory Visit: Payer: Self-pay

## 2021-02-02 ENCOUNTER — Ambulatory Visit
Admission: EM | Admit: 2021-02-02 | Discharge: 2021-02-02 | Disposition: A | Discharge status: Home / Self Care | Payer: Medicaid Other | Attending: Emergency Medicine | Admitting: Emergency Medicine

## 2021-02-02 DIAGNOSIS — I10 Essential (primary) hypertension: Secondary | ICD-10-CM | POA: Diagnosis not present

## 2021-02-02 DIAGNOSIS — Z760 Encounter for issue of repeat prescription: Secondary | ICD-10-CM

## 2021-02-02 MED ORDER — LISINOPRIL 10 MG PO TABS: 10.0000 mg | ORAL_TABLET | Freq: Every day | ORAL | 0 refills | Status: DC

## 2021-02-02 NOTE — ED Triage Notes (Signed)
Pt needs refill for bp medication

## 2021-02-02 NOTE — ED Provider Notes (Signed)
[facility] CARE CENTER   [id number] 02/02/21 Arrival Time: 1441  [id number] blood pressure  SUBJECTIVE:  Kathy Rios is a 20 y.o. female who presents for blood pressure medication refill.  Hx of HTN x 10 years.  States blood pressure on average is 115/60.  Has been well controlled on lisinopril 10 mg daily.  However, has been out of medication for about 3 weeks.  135/110, then recheck 145/110.  Does not have a PCP.  Complains of associated lightheadedness and dizziness.  Denies HA, vision changes, chest pain, shortness of breath, numbness or tingling in extremities, abdominal pain, changes in bowel or bladder habits.      ROS: As per HPI.  All other pertinent ROS negative.     Past Medical History:  Diagnosis Date   ADD (attention deficit disorder)    ADHD (attention deficit hyperactivity disorder)    Anemia    Anxiety    Depression    Diabetes mellitus    Diabetes mellitus, type II (HCC)    Encounter for menstrual regulation 01/25/2016   Hypertension    Irregular periods 10/25/2015   Obesity    PMS (premenstrual syndrome) 11/05/2015   Reactive hypoglycemia    Severe menstrual cramps 11/05/2015   Thalassemia trait    Thalassemia trait, alpha    Past Surgical History:  Procedure Laterality Date   ADENOIDECTOMY     TONSILLECTOMY     WISDOM TOOTH EXTRACTION  07/2016   Allergies  Allergen Reactions   Selenium Sulfide Rash   Motrin [Ibuprofen] Hives and Itching   Sulfa Antibiotics Rash   No current facility-administered medications on file prior to encounter.   Current Outpatient Medications on File Prior to Encounter  Medication Sig Dispense Refill   acetaminophen (TYLENOL) 500 MG tablet Take 1,000 mg by mouth every 6 (six) hours as needed for mild pain or headache.     ALPHA LIPOIC ACID PO Take by mouth.     cetirizine (ZYRTEC) 10 MG tablet Take 10 mg by mouth as needed.      escitalopram (LEXAPRO) 20 MG tablet TAKE 1 TABLET(20 MG) BY MOUTH TWICE DAILY 60 tablet 2    L-ARGININE PO Take by mouth.     linaclotide (LINZESS) 145 MCG CAPS capsule Take 1 capsule (145 mcg total) by mouth daily before breakfast. (Patient not taking: Reported on 12/26/2020) 30 capsule 12   Melatonin 5 MG TABS Take 2 tablets by mouth at bedtime as needed (sleep).     metFORMIN (GLUCOPHAGE) 500 MG tablet Take 500 mg by mouth 2 (two) times daily with a meal.     methocarbamol (ROBAXIN) 500 MG tablet Take 1 tablet (500 mg total) by mouth every 8 (eight) hours as needed for muscle spasms. (Patient not taking: Reported on 12/26/2020) 8 tablet 0   Multiple Vitamin (MULTIVITAMIN WITH MINERALS) TABS tablet Take 1 tablet by mouth daily.     Norethindrone Acetate-Ethinyl Estrad-FE (LOESTRIN 24 FE) 1-20 MG-MCG(24) tablet Take 1 tablet by mouth daily. 28 tablet 11   Omega-3 Fatty Acids (FISH OIL PO) Take 1,000 mg by mouth daily.      valACYclovir (VALTREX) 1000 MG tablet Take 2 tablets by mouth 2 (two) times daily as needed (cold sores).   1   Social History   Socioeconomic History   Marital status: Single    Spouse name: Not on file   Number of children: Not on file   Years of education: Not on file   Highest education level: Not on file  Occupational History   Not on file  Tobacco Use   Smoking status: Never   Smokeless tobacco: Never  Vaping Use   Vaping Use: Never used  Substance and Sexual Activity   Alcohol use: Yes    Comment: "sometimes"   Drug use: No   Sexual activity: Yes    Birth control/protection: Pill  Other Topics Concern   Not on file  Social History Narrative   Not on file   Social Determinants of Health   Financial Resource Strain: Not on file  Food Insecurity: Not on file  Transportation Needs: Not on file  Physical Activity: Not on file  Stress: Not on file  Social Connections: Not on file  Intimate Partner Violence: Not on file   Family History  Problem Relation Age of Onset   Seizures Mother    Arthritis Mother    Diabetes Mother     Hyperlipidemia Mother    Depression Mother    Hyperlipidemia Father    Hypertension Father    Gout Father    Dementia Father    Hypertension Sister    Mental illness Brother    Hyperlipidemia Brother    ADD / ADHD Brother    Depression Brother    Hypertension Maternal Grandmother    Cancer Maternal Grandmother    Hypertension Maternal Grandfather    Thyroid disease Maternal Grandfather    Cancer Maternal Grandfather    Prostate cancer Maternal Grandfather    Anemia Paternal Grandmother    Cancer Paternal Grandmother        cervical   Hypertension Paternal Grandfather    Heart disease Paternal Grandfather    Hypertension Other    Other Other        heart skips-maternal great grandma   Other Other        fibroids- maternal grandma and great grandma   Heart disease Other    Schizophrenia Maternal Aunt    Bipolar disorder Maternal Aunt    Alcohol abuse Maternal Uncle     OBJECTIVE:  Vitals:   02/02/21 1547 02/02/21 1550  BP: (!) 137/102 120/82  Pulse: 83   Resp: 17   Temp: 97.9 F (36.6 C)   TempSrc: Tympanic   SpO2: 98%     General appearance: alert; no distress Eyes: PERRLA; EOMI HENT: normocephalic; atraumatic Neck: supple with FROM Lungs: clear to auscultation bilaterally Heart: regular rate and rhythm.  Extremities: no edema; symmetrical with no gross deformities Skin: warm and dry Neurologic: ambulates without difficulty Psychological: alert and cooperative; normal mood and affect  ASSESSMENT & PLAN:  1. Essential hypertension   2. Medication refill     Meds ordered this encounter  Medications   lisinopril (ZESTRIL) 10 MG tablet    Sig: Take 1 tablet (10 mg total) by mouth daily.    Dispense:  30 tablet    Refill:  0    Order Specific Question:   Supervising Provider    Answer:   Eustace Moore [[id number]]   Medication refilled Please continue to monitor blood pressure at home and keep a log Eat a well balanced diet of fruits, vegetables  and lean meats.  Avoid foods high in fat and salt Drink water.  At least half your body weight in ounces Exercise for at least 30 minutes daily Return or go to the ED if you have any new or worsening symptoms such as vision changes, fatigue, dizziness, chest pain, shortness of breath, nausea, swelling in your hands or feet,  urinary symptoms, etc...   Reviewed expectations re: course of current medical issues. Questions answered. Outlined signs and symptoms indicating need for more acute intervention. Patient verbalized understanding. After Visit Summary given.    Rennis Harding, PA-C 02/02/21 1622

## 2021-02-02 NOTE — Discharge Instructions (Addendum)
Medication refilled Please continue to monitor blood pressure at home and keep a log Eat a well balanced diet of fruits, vegetables and lean meats.  Avoid foods high in fat and salt Drink water.  At least half your body weight in ounces Exercise for at least 30 minutes daily Return or go to the ED if you have any new or worsening symptoms such as vision changes, fatigue, dizziness, chest pain, shortness of breath, nausea, swelling in your hands or feet, urinary symptoms, etc..Marland Kitchen

## 2021-02-07 ENCOUNTER — Other Ambulatory Visit: Payer: Medicaid Other | Admitting: Adult Health

## 2021-02-25 ENCOUNTER — Ambulatory Visit (INDEPENDENT_AMBULATORY_CARE_PROVIDER_SITE_OTHER): Payer: Medicaid Other | Admitting: Nurse Practitioner

## 2021-02-25 ENCOUNTER — Encounter: Payer: Self-pay | Admitting: Nurse Practitioner

## 2021-02-25 ENCOUNTER — Other Ambulatory Visit: Payer: Self-pay

## 2021-02-25 VITALS — BP 135/79 | HR 86 | Temp 98.4°F | Resp 18 | Ht 65.0 in | Wt 303.8 lb

## 2021-02-25 DIAGNOSIS — D563 Thalassemia minor: Secondary | ICD-10-CM | POA: Diagnosis not present

## 2021-02-25 DIAGNOSIS — J302 Other seasonal allergic rhinitis: Secondary | ICD-10-CM | POA: Diagnosis not present

## 2021-02-25 DIAGNOSIS — F3341 Major depressive disorder, recurrent, in partial remission: Secondary | ICD-10-CM

## 2021-02-25 DIAGNOSIS — Z7689 Persons encountering health services in other specified circumstances: Secondary | ICD-10-CM | POA: Diagnosis not present

## 2021-02-25 DIAGNOSIS — R7303 Prediabetes: Secondary | ICD-10-CM

## 2021-02-25 DIAGNOSIS — Z91018 Allergy to other foods: Secondary | ICD-10-CM

## 2021-02-25 NOTE — Assessment & Plan Note (Signed)
-  has been taking escitalopram, but dose she reports is 20 mg BID, which is above the max dose -referral to psych

## 2021-02-25 NOTE — Patient Instructions (Signed)
Please have fasting labs drawn 2-3 days prior to your appointment so we can discuss the results during your office visit.  

## 2021-02-25 NOTE — Assessment & Plan Note (Signed)
-  check CBC with next set of labs

## 2021-02-25 NOTE — Progress Notes (Signed)
New Patient Office Visit  Subjective:  Patient ID: Kathy Rios, female    DOB: May 31, 2001  Age: 20 y.o. MRN: [id number]  CC:  Chief Complaint  Patient presents with  . New Patient (Initial Visit)    New patient was being seen at the [facility] in [place] she needs meds refills also she is on antidepressant however she would like to get pcp to fill these as she doesn't want to be seen at [facility] for psychiatry     HPI Paytan Recine presents for new patient visit. Transferring care from [facility]. Last physical was over a year ago. Last labs were drawn by [facility] on 12/26/20.  She is followed by [facility] for GYN needs.  She has anxiety and depression. She saw psychiatry in this building, but she does not want to see her again.    Past Medical History:  Diagnosis Date  . ADD (attention deficit disorder)   . ADHD (attention deficit hyperactivity disorder)   . Anemia   . Anxiety   . Depression   . Diabetes mellitus   . Diabetes mellitus, type II ([place])   . Encounter for menstrual regulation 01/25/2016  . Hypertension   . Irregular periods 10/25/2015  . Obesity   . PMS (premenstrual syndrome) 11/05/2015  . Reactive hypoglycemia   . Severe menstrual cramps 11/05/2015  . Thalassemia trait   . Thalassemia trait, alpha     Past Surgical History:  Procedure Laterality Date  . ADENOIDECTOMY    . TONSILLECTOMY    . WISDOM TOOTH EXTRACTION  07/2016    Family History  Problem Relation Age of Onset  . Seizures Mother   . Arthritis Mother   . Diabetes Mother   . Hyperlipidemia Mother   . Depression Mother   . Hyperlipidemia Father   . Hypertension Father   . Gout Father   . Dementia Father   . Hypertension Sister   . Mental illness Brother   . Hyperlipidemia Brother   . ADD / ADHD Brother   . Depression Brother   . Hypertension Maternal Grandmother   . Cancer Maternal Grandmother   . Hypertension Maternal Grandfather   . Thyroid disease Maternal  Grandfather   . Cancer Maternal Grandfather   . Prostate cancer Maternal Grandfather   . Anemia Paternal Grandmother   . Cancer Paternal Grandmother        cervical  . Hypertension Paternal Grandfather   . Heart disease Paternal Grandfather   . Hypertension Other   . Other Other        heart skips-maternal great grandma  . Other Other        fibroids- maternal grandma and great grandma  . Heart disease Other   . Schizophrenia Maternal Aunt   . Bipolar disorder Maternal Aunt   . Alcohol abuse Maternal Uncle     Social History   Socioeconomic History  . Marital status: Single    Spouse name: Not on file  . Number of children: Not on file  . Years of education: Not on file  . Highest education level: Not on file  Occupational History  . Not on file  Tobacco Use  . Smoking status: Never  . Smokeless tobacco: Never  Vaping Use  . Vaping Use: Never used  Substance and Sexual Activity  . Alcohol use: Yes    Comment: "sometimes"  . Drug use: No  . Sexual activity: Yes    Birth control/protection: Pill  Other Topics Concern  .  Not on file  Social History Narrative  . Not on file   Social Determinants of Health   Financial Resource Strain: Not on file  Food Insecurity: Not on file  Transportation Needs: Not on file  Physical Activity: Not on file  Stress: Not on file  Social Connections: Not on file  Intimate Partner Violence: Not on file    ROS Review of Systems  Constitutional: Negative.   Respiratory: Negative.    Cardiovascular: Negative.   Musculoskeletal: Negative.   Psychiatric/Behavioral:  Negative for self-injury and suicidal ideas.        Hx of anx/dep; she states she saw psychiatry recently and won't go back to the one that she saw, but the psychiatrist had recommended testing for autism spectrum disorder   Objective:   Today's Vitals: BP 135/79 (BP Location: Left Arm, Patient Position: Sitting, Cuff Size: Normal)   Pulse 86   Temp 98.4 F (36.9  C) (Oral)   Resp 18   Ht _0  (1.651 m)   Wt (!) 303 lb 12.8 oz (137.8 kg)   SpO2 98%   BMI 50.55 kg/m   Physical Exam Constitutional:      Appearance: Normal appearance. She is obese.  Cardiovascular:     Rate and Rhythm: Normal rate and regular rhythm.     Pulses: Normal pulses.     Heart sounds: Normal heart sounds.  Pulmonary:     Effort: Pulmonary effort is normal.     Breath sounds: Normal breath sounds.  Musculoskeletal:        General: Normal range of motion.  Neurological:     Mental Status: She is alert.  Psychiatric:        Mood and Affect: Mood normal.        Behavior: Behavior normal.        Thought Content: Thought content normal.        Judgment: Judgment normal.    Assessment & Plan:   Problem List Items Addressed This Visit       Other   Alpha thalassemia trait    -check CBC with next set of labs       Relevant Orders   B12   Prediabetes    -will check A1c with next set of labs -takes metformin       Relevant Orders   Lipid Panel With LDL/HDL Ratio   Hemoglobin A1c   Depression    -has been taking escitalopram, but dose she reports is 20 mg BID, which is above the max dose -referral to psych       Relevant Orders   Ambulatory referral to Psychiatry   Seasonal allergies    -uses zyrtec       Food allergy    -she states that she was recently diagnosed with allergy to meat -testing results requested; ? Alpha-gal -will check vit B12 with labs to see if supplement is needed since she is doing plant-based diet       Relevant Orders   B12   Other Visit Diagnoses     Encounter to establish care    -  Primary   Relevant Orders   CBC with Differential/Platelet   Lipid Panel With LDL/HDL Ratio   CMP14+EGFR   Hemoglobin A1c       Outpatient Encounter Medications as of 02/25/2021  Medication Sig  . acetaminophen (TYLENOL) 500 MG tablet Take 1,000 mg by mouth every 6 (six) hours as needed for mild pain or headache.  Marland Kitchen  ALPHA  LIPOIC ACID PO Take by mouth.  . escitalopram (LEXAPRO) 20 MG tablet TAKE 1 TABLET(20 MG) BY MOUTH TWICE DAILY  . L-ARGININE PO Take by mouth.  Marland Kitchen lisinopril (ZESTRIL) 10 MG tablet Take 1 tablet (10 mg total) by mouth daily.  . Melatonin 5 MG TABS Take 2 tablets by mouth at bedtime as needed (sleep).  . metFORMIN (GLUCOPHAGE) 500 MG tablet Take 500 mg by mouth 2 (two) times daily with a meal.  . Multiple Vitamin (MULTIVITAMIN WITH MINERALS) TABS tablet Take 1 tablet by mouth daily.  . Norethindrone Acetate-Ethinyl Estrad-FE (LOESTRIN 24 FE) 1-20 MG-MCG(24) tablet Take 1 tablet by mouth daily.  . valACYclovir (VALTREX) 1000 MG tablet Take 2 tablets by mouth 2 (two) times daily as needed (cold sores).   . [DISCONTINUED] cetirizine (ZYRTEC) 10 MG tablet Take 10 mg by mouth as needed.  (Patient not taking: Reported on 02/25/2021)  . [DISCONTINUED] linaclotide (LINZESS) 145 MCG CAPS capsule Take 1 capsule (145 mcg total) by mouth daily before breakfast. (Patient not taking: Reported on 12/26/2020)  . [DISCONTINUED] methocarbamol (ROBAXIN) 500 MG tablet Take 1 tablet (500 mg total) by mouth every 8 (eight) hours as needed for muscle spasms. (Patient not taking: Reported on 12/26/2020)  . [DISCONTINUED] Omega-3 Fatty Acids (FISH OIL PO) Take 1,000 mg by mouth daily.  (Patient not taking: Reported on 02/25/2021)   No facility-administered encounter medications on file as of 02/25/2021.    Follow-up: Return in about 6 weeks (around 04/08/2021) for Lab follow-up (prediabetes).   Noreene Larsson, NP

## 2021-02-25 NOTE — Assessment & Plan Note (Addendum)
-  will check A1c with next set of labs -takes metformin

## 2021-02-25 NOTE — Assessment & Plan Note (Signed)
-  uses zyrtec

## 2021-02-25 NOTE — Assessment & Plan Note (Signed)
-  she states that she was recently diagnosed with allergy to meat -testing results requested; ? Alpha-gal -will check vit B12 with labs to see if supplement is needed since she is doing plant-based diet

## 2021-03-13 ENCOUNTER — Ambulatory Visit: Payer: Medicaid Other

## 2021-03-21 ENCOUNTER — Other Ambulatory Visit: Payer: Self-pay

## 2021-03-21 ENCOUNTER — Telehealth (INDEPENDENT_AMBULATORY_CARE_PROVIDER_SITE_OTHER): Payer: Medicaid Other | Admitting: Licensed Clinical Social Worker

## 2021-03-21 DIAGNOSIS — F320 Major depressive disorder, single episode, mild: Secondary | ICD-10-CM

## 2021-03-21 NOTE — Progress Notes (Signed)
No Show

## 2021-03-25 ENCOUNTER — Other Ambulatory Visit: Payer: Medicaid Other | Admitting: Adult Health

## 2021-03-28 ENCOUNTER — Ambulatory Visit: Payer: Medicaid Other | Admitting: Licensed Clinical Social Worker

## 2021-03-28 ENCOUNTER — Other Ambulatory Visit: Payer: Self-pay

## 2021-03-28 ENCOUNTER — Telehealth (INDEPENDENT_AMBULATORY_CARE_PROVIDER_SITE_OTHER): Payer: Medicaid Other | Admitting: Licensed Clinical Social Worker

## 2021-03-28 DIAGNOSIS — F320 Major depressive disorder, single episode, mild: Secondary | ICD-10-CM

## 2021-03-28 DIAGNOSIS — F411 Generalized anxiety disorder: Secondary | ICD-10-CM

## 2021-03-28 NOTE — BH Specialist Note (Signed)
[facility] Initial Clinical Assessment  MRN: [id number] NAME: Elza Varricchio Date: 03/28/21  Start time:  12 End time:  1230p Total time:  30 min Call number:  [phone]  Type of Contact:  phone Patient consent obtained:  yes Reason for Visit today:  initiate VBH services  Treatment History Patient recently received Inpatient Treatment:  no  Facility/Program:    Date of discharge:   Patient currently being seen by therapist/psychiatrist:   Patient currently receiving the following services:    Past Psychiatric History/Hospitalization(s): Anxiety: No Bipolar Disorder: No Depression: Yes Mania: No Psychosis: No Schizophrenia: No Personality Disorder: No Hospitalization for psychiatric illness: No History of Electroconvulsive Shock Therapy: No Prior Suicide Attempts: No  Clinical Assessment:  PHQ-9 Assessments: Depression screen [facility] 2/9 03/28/2021 02/25/2021 09/08/2018  Decreased Interest 3 0 0  Down, Depressed, Hopeless 2 2 1   PHQ - 2 Score 5 2 1   Altered sleeping 3 1 -  Tired, decreased energy 2 1 -  Change in appetite 1 1 -  Feeling bad or failure about yourself  1 1 -  Trouble concentrating 3 1 -  Moving slowly or fidgety/restless 2 1 -  Suicidal thoughts 1 1 -  PHQ-9 Score 18 9 -  Difficult doing work/chores Somewhat difficult Very difficult -    GAD-7 Assessments: GAD 7 : Generalized Anxiety Score 03/28/2021  Nervous, Anxious, on Edge 2  Control/stop worrying 2  Worry too much - different things 2  Trouble relaxing 3  Restless 1  Easily annoyed or irritable 2  Afraid - awful might happen 2  Total GAD 7 Score 14  Anxiety Difficulty Very difficult     Social Functioning Social maturity:  WNL Social judgement:  WNL  Stress Current stressors:  not on medication, lives with family Familial stressors:  lives with family Sleep:  WNL Appetite:  fair Coping ability:  overwhelmed Patient taking medications as prescribed:  yes  Current  medications:  Outpatient Encounter Medications as of 03/28/2021  Medication Sig   acetaminophen (TYLENOL) 500 MG tablet Take 1,000 mg by mouth every 6 (six) hours as needed for mild pain or headache.   ALPHA LIPOIC ACID PO Take by mouth.   escitalopram (LEXAPRO) 20 MG tablet TAKE 1 TABLET(20 MG) BY MOUTH TWICE DAILY   L-ARGININE PO Take by mouth.   lisinopril (ZESTRIL) 10 MG tablet Take 1 tablet (10 mg total) by mouth daily.   Melatonin 5 MG TABS Take 2 tablets by mouth at bedtime as needed (sleep).   metFORMIN (GLUCOPHAGE) 500 MG tablet Take 500 mg by mouth 2 (two) times daily with a meal.   Multiple Vitamin (MULTIVITAMIN WITH MINERALS) TABS tablet Take 1 tablet by mouth daily.   Norethindrone Acetate-Ethinyl Estrad-FE (LOESTRIN 24 FE) 1-20 MG-MCG(24) tablet Take 1 tablet by mouth daily.   valACYclovir (VALTREX) 1000 MG tablet Take 2 tablets by mouth 2 (two) times daily as needed (cold sores).    No facility-administered encounter medications on file as of 03/28/2021.    Self-harm Behaviors Risk Assessment Self-harm risk factors:   Patient endorses recent thoughts of harming self:    05/28/2021 Suicide Severity Rating Scale:  C-SRSS 09/19/2020 10/21/2020 12/12/2020 Feb 23, 2021  1. Wish to be Dead No No No No  2. Suicidal Thoughts No No No No  6. Suicide Behavior Question No No No No    Danger to Others Risk Assessment Danger to others risk factors:   Patient endorses recent thoughts of harming others:  Dynamic Appraisal of Situational Aggression (DASA): No flowsheet data found.  Substance Use Assessment Patient recently consumed alcohol:    Alcohol Use Disorder Identification Test (AUDIT): No flowsheet data found. Patient recently used drugs:    Opioid Risk Assessment:  Patient is concerned about dependence or abuse of substances:    ASAM Multidimensional Assessment Summary:  Dimension 1:    Dimension 1 Rating:    Dimension 2:    Dimension 2 Rating:    Dimension 3:    Dimension 3  Rating:    Dimension 4:    Dimension 4 Rating:    Dimension 5:    Dimension 5 Rating:    Dimension 6:    Dimension 6 Rating:   ASAM's Severity Rating Score:   ASAM Recommended Level of Treatment:     Goals, Interventions and Follow-up Plan Goals: Increase healthy adjustment to current life circumstances Interventions: Solution-Focused Strategies Follow-up Plan:  Weekly VBH sessions; will refer out for med management  Summary of Clinical Assessment Summary: Kathy Rios is a 20 yr old woman who currently lives with her family.  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  She has a family history of depression (mother and MGM) she reports that her maternal aunt has a bipolar and Schizophrenia diagnosis. Patient has a history of taking Lexapro 20mg  as she was a patient of Dr. .  She has good benefit from medication and discontinued in April 2022.  She wants to start back.  She has symtpoms of depression (sadness, poor sleep, poor appetite, lack of energy and motivation) anxiety (worry, restlessness and on edge)   May 2022, LCSW

## 2021-04-02 ENCOUNTER — Ambulatory Visit: Payer: Medicaid Other

## 2021-04-03 ENCOUNTER — Telehealth: Payer: Self-pay | Admitting: Licensed Clinical Social Worker

## 2021-04-03 DIAGNOSIS — F320 Major depressive disorder, single episode, mild: Secondary | ICD-10-CM

## 2021-04-03 DIAGNOSIS — F411 Generalized anxiety disorder: Secondary | ICD-10-CM

## 2021-04-03 NOTE — BH Specialist Note (Signed)
[facility] Treatment Plan Team Note  MRN: [id number] NAME: Kathy Rios  DATE: 04/03/21  Start time: 440p  End time:  445p Total time:   5 min  Total number of Virtual BH Treatment Team Plan encounters: 1/4  Treatment Team Attendees: Nolon Rod, LCSW & Dr. Vanetta Shawl, Psychiatrist  Diagnoses: No diagnosis found.  Goals, Interventions and Follow-up Plan Goals: Increase healthy adjustment to current life circumstances Interventions: Solution-Focused Strategies Medication Management Recommendations: n/a refer out to psychiatry given she is not interested in seeing Dr. Tenny Craw again Follow-up Plan: Weekly VBH sessions; will refer out for med management  History of the present illness Presenting Problem/Current Symptoms: continued symptoms of her diagnosis  Psychiatric History  Depression: Yes Anxiety: Yes Mania: No Psychosis: No PTSD symptoms: No  Past Psychiatric History/Hospitalization(s): Hospitalization for psychiatric illness: No Prior Suicide Attempts: No Prior Self-injurious behavior: No  Psychosocial stressors caring for disabled mother  Self-harm Behaviors Risk Assessment n/a  Screenings PHQ-9 Assessments:  Depression screen [facility] 2/9 03/28/2021 02/25/2021 09/08/2018  Decreased Interest 3 0 0  Down, Depressed, Hopeless 2 2 1   PHQ - 2 Score 5 2 1   Altered sleeping 3 1 -  Tired, decreased energy 2 1 -  Change in appetite 1 1 -  Feeling bad or failure about yourself  1 1 -  Trouble concentrating 3 1 -  Moving slowly or fidgety/restless 2 1 -  Suicidal thoughts 1 1 -  PHQ-9 Score 18 9 -  Difficult doing work/chores Somewhat difficult Very difficult -   GAD-7 Assessments:  GAD 7 : Generalized Anxiety Score 03/28/2021  Nervous, Anxious, on Edge 2  Control/stop worrying 2  Worry too much - different things 2  Trouble relaxing 3  Restless 1  Easily annoyed or irritable 2  Afraid - awful might happen 2  Total GAD 7 Score 14  Anxiety Difficulty  Very difficult    Past Medical History Past Medical History:  Diagnosis Date   ADD (attention deficit disorder)    ADHD (attention deficit hyperactivity disorder)    Anemia    Anxiety    Depression    Diabetes mellitus    Diabetes mellitus, type II ([facility])    Encounter for menstrual regulation 01/25/2016   Hypertension    Irregular periods 10/25/2015   Obesity    PMS (premenstrual syndrome) 11/05/2015   Reactive hypoglycemia    Severe menstrual cramps 11/05/2015   Thalassemia trait    Thalassemia trait, alpha     Vital signs: There were no vitals filed for this visit.  Allergies:  Allergies as of 04/03/2021 - Review Complete 02/25/2021  Allergen Reaction Noted   Selenium sulfide Rash 02/27/2017   Motrin [ibuprofen] Hives and Itching 04/07/2011   Other Rash 02/25/2021   Sulfa antibiotics Rash 04/07/2011    Medication History Current medications:  Outpatient Encounter Medications as of 04/03/2021  Medication Sig   acetaminophen (TYLENOL) 500 MG tablet Take 1,000 mg by mouth every 6 (six) hours as needed for mild pain or headache.   ALPHA LIPOIC ACID PO Take by mouth.   escitalopram (LEXAPRO) 20 MG tablet TAKE 1 TABLET(20 MG) BY MOUTH TWICE DAILY   L-ARGININE PO Take by mouth.   lisinopril (ZESTRIL) 10 MG tablet Take 1 tablet (10 mg total) by mouth daily.   Melatonin 5 MG TABS Take 2 tablets by mouth at bedtime as needed (sleep).   metFORMIN (GLUCOPHAGE) 500 MG tablet Take 500 mg by mouth 2 (two) times daily with a meal.  Multiple Vitamin (MULTIVITAMIN WITH MINERALS) TABS tablet Take 1 tablet by mouth daily.   Norethindrone Acetate-Ethinyl Estrad-FE (LOESTRIN 24 FE) 1-20 MG-MCG(24) tablet Take 1 tablet by mouth daily.   valACYclovir (VALTREX) 1000 MG tablet Take 2 tablets by mouth 2 (two) times daily as needed (cold sores).    No facility-administered encounter medications on file as of 04/03/2021.     Scribe for Treatment Team: Marinda Elk, LCSW

## 2021-04-03 NOTE — Progress Notes (Signed)
[facility] Psychiatric Consultant Case Review   Kathy Rios is a 20 y.o. year old female with a history of depression, anxiety, ADD,  alfa thalassemia traits, prediabetes, type II diabetes, hypertension. Depression in the context of taking care of her mother who is disabled. PCP dose not feel comfortable prescribing lexapro given it is higher than recommended dose. Although she used to be patient of Dr. Tenny Craw, she is not interested in seeing her again.   Assessment/Provisional Diagnosis # MDD Will refer out due to PCP concern of her current medication.   Recommendation  - Refer out to psychiatry   Thank you for your consult. We will sign off. Please contact [facility]  for any questions or concerns.   The above treatment considerations and suggestions are based on consultation with the [facility] specialist and/or PCP and a review of information available in the shared registry and the patient's Electronic Health Record (EHR). I have not personally examined the patient. All recommendations should be implemented with consideration of the patient's relevant prior history and current clinical status. Please feel free to call me with any questions about the care of this patient.

## 2021-04-04 ENCOUNTER — Other Ambulatory Visit: Payer: Self-pay

## 2021-04-04 ENCOUNTER — Ambulatory Visit (INDEPENDENT_AMBULATORY_CARE_PROVIDER_SITE_OTHER): Payer: Medicaid Other | Admitting: Licensed Clinical Social Worker

## 2021-04-04 DIAGNOSIS — F411 Generalized anxiety disorder: Secondary | ICD-10-CM

## 2021-04-04 DIAGNOSIS — F320 Major depressive disorder, single episode, mild: Secondary | ICD-10-CM

## 2021-04-08 ENCOUNTER — Ambulatory Visit: Payer: Medicaid Other | Admitting: Nurse Practitioner

## 2021-04-10 ENCOUNTER — Telehealth (INDEPENDENT_AMBULATORY_CARE_PROVIDER_SITE_OTHER): Payer: Medicaid Other | Admitting: Licensed Clinical Social Worker

## 2021-04-10 ENCOUNTER — Telehealth: Payer: Self-pay | Admitting: Licensed Clinical Social Worker

## 2021-04-10 DIAGNOSIS — F411 Generalized anxiety disorder: Secondary | ICD-10-CM

## 2021-04-10 DIAGNOSIS — F320 Major depressive disorder, single episode, mild: Secondary | ICD-10-CM

## 2021-04-10 NOTE — BH Specialist Note (Signed)
[place] Virtual BH Telephone Follow-up  MRN: [id number] NAME: Graciela Plato Date: 04/10/21  Start time: 1230p  End time:  1p Total time:  30 min Call number:  [phone]  Reason for call today:  follow up  PHQ-9 Scores:  Depression screen [facility] 2/9 03/28/2021 02/25/2021 09/08/2018  Decreased Interest 3 0 0  Down, Depressed, Hopeless 2 2 1   PHQ - 2 Score 5 2 1   Altered sleeping 3 1 -  Tired, decreased energy 2 1 -  Change in appetite 1 1 -  Feeling bad or failure about yourself  1 1 -  Trouble concentrating 3 1 -  Moving slowly or fidgety/restless 2 1 -  Suicidal thoughts 1 1 -  PHQ-9 Score 18 9 -  Difficult doing work/chores Somewhat difficult Very difficult -   GAD-7 Scores:  GAD 7 : Generalized Anxiety Score 03/28/2021  Nervous, Anxious, on Edge 2  Control/stop worrying 2  Worry too much - different things 2  Trouble relaxing 3  Restless 1  Easily annoyed or irritable 2  Afraid - awful might happen 2  Total GAD 7 Score 14  Anxiety Difficulty Very difficult    Stress Current stressors:  caring for disabled mother, poor sleep pattern, new job Sleep:  poor Appetite:  fair Coping ability:  overwhelmed Patient taking medications as prescribed:    Current medications:  Outpatient Encounter Medications as of 04/10/2021  Medication Sig   acetaminophen (TYLENOL) 500 MG tablet Take 1,000 mg by mouth every 6 (six) hours as needed for mild pain or headache.   ALPHA LIPOIC ACID PO Take by mouth.   escitalopram (LEXAPRO) 20 MG tablet TAKE 1 TABLET(20 MG) BY MOUTH TWICE DAILY   L-ARGININE PO Take by mouth.   lisinopril (ZESTRIL) 10 MG tablet Take 1 tablet (10 mg total) by mouth daily.   Melatonin 5 MG TABS Take 2 tablets by mouth at bedtime as needed (sleep).   metFORMIN (GLUCOPHAGE) 500 MG tablet Take 500 mg by mouth 2 (two) times daily with a meal.   Multiple Vitamin (MULTIVITAMIN WITH MINERALS) TABS tablet Take 1 tablet by mouth daily.   Norethindrone Acetate-Ethinyl  Estrad-FE (LOESTRIN 24 FE) 1-20 MG-MCG(24) tablet Take 1 tablet by mouth daily.   valACYclovir (VALTREX) 1000 MG tablet Take 2 tablets by mouth 2 (two) times daily as needed (cold sores).    No facility-administered encounter medications on file as of 04/10/2021.     Self-harm Behaviors Risk Assessment Self-harm risk factors:   Patient endorses recent thoughts of harming self:    04/12/2021 Suicide Severity Rating Scale: No flowsheet data found. C-SRSS 09/19/2020 10/21/2020 12/12/2020 02-06-2021  1. Wish to be Dead No No No No  2. Suicidal Thoughts No No No No  6. Suicide Behavior Question No No No No     Danger to Others Risk Assessment Danger to others risk factors:  n/a Patient endorses recent thoughts of harming others:    Dynamic Appraisal of Situational Aggression (DASA): No flowsheet data found.   Substance Use Assessment Patient recently consumed alcohol:  n/a  Alcohol Use Disorder Identification Test (AUDIT): No flowsheet data found. Patient recently used drugs:    Opioid Risk Assessment:    Goals, Interventions and Follow-up Plan Goals: Increase healthy adjustment to current life circumstances Interventions: Solution-Focused Strategies and Mindfulness or Relaxation Training Follow-up Plan:  biweekly VBH sessions  Summary: 12/14/2020 is a 20 yr old woman referred by her NP at [facility]. SHe was present for her follow up to  assist with coping skills. Factors that contribute to client's ongoing depressive symptoms were discussed and include real and perceived feelings of isolation, criticism, rejection, shame and guilt.  Discussion of coping skills such as behavioral activation, mindfulness and relaxation techniques  Marinda Elk, LCSW

## 2021-04-10 NOTE — BH Specialist Note (Signed)
Virtual Behavioral Health Treatment Plan Team Note  MRN: [id number] NAME: Kathy Rios  DATE: 04/10/21  Start time:   325pEnd time:  330p Total time:  5 min  Total number of Virtual BH Treatment Team Plan encounters: 2/4  Treatment Team Attendees: Nolon Rod, LCSW & Dr. Vanetta Shawl Psychiatrist  Diagnoses: No diagnosis found.  Goals, Interventions and Follow-up Plan Goals: Increase healthy adjustment to current life circumstances Interventions: Solution-Focused Strategies Mindfulness or Relaxation Training Medication Management Recommendations: n/a, she will be seen at [facility] Follow-up Plan: biweekly VBH sessions  History of the present illness Presenting Problem/Current Symptoms: continued symptoms of her diagnosis  Psychiatric History  Depression: Yes Anxiety: No Mania: No Psychosis: No PTSD symptoms: No  Past Psychiatric History/Hospitalization(s): Hospitalization for psychiatric illness: No Prior Suicide Attempts: No Prior Self-injurious behavior: No  Psychosocial stressors  Caring for her disabled mother Isolation  Self-harm Behaviors Risk Assessment   Screenings PHQ-9 Assessments:  Depression screen [facility] 2/9 03/28/2021 02/25/2021 09/08/2018  Decreased Interest 3 0 0  Down, Depressed, Hopeless 2 2 1   PHQ - 2 Score 5 2 1   Altered sleeping 3 1 -  Tired, decreased energy 2 1 -  Change in appetite 1 1 -  Feeling bad or failure about yourself  1 1 -  Trouble concentrating 3 1 -  Moving slowly or fidgety/restless 2 1 -  Suicidal thoughts 1 1 -  PHQ-9 Score 18 9 -  Difficult doing work/chores Somewhat difficult Very difficult -   GAD-7 Assessments:  GAD 7 : Generalized Anxiety Score 03/28/2021  Nervous, Anxious, on Edge 2  Control/stop worrying 2  Worry too much - different things 2  Trouble relaxing 3  Restless 1  Easily annoyed or irritable 2  Afraid - awful might happen 2  Total GAD 7 Score 14  Anxiety Difficulty Very difficult    Past Medical  History Past Medical History:  Diagnosis Date   ADD (attention deficit disorder)    ADHD (attention deficit hyperactivity disorder)    Anemia    Anxiety    Depression    Diabetes mellitus    Diabetes mellitus, type II (HCC)    Encounter for menstrual regulation 01/25/2016   Hypertension    Irregular periods 10/25/2015   Obesity    PMS (premenstrual syndrome) 11/05/2015   Reactive hypoglycemia    Severe menstrual cramps 11/05/2015   Thalassemia trait    Thalassemia trait, alpha     Vital signs: There were no vitals filed for this visit.  Allergies:  Allergies as of 04/10/2021 - Review Complete 02/25/2021  Allergen Reaction Noted   Selenium sulfide Rash 02/27/2017   Motrin [ibuprofen] Hives and Itching 04/07/2011   Other Rash 02/25/2021   Sulfa antibiotics Rash 04/07/2011    Medication History Current medications:  Outpatient Encounter Medications as of 04/10/2021  Medication Sig   acetaminophen (TYLENOL) 500 MG tablet Take 1,000 mg by mouth every 6 (six) hours as needed for mild pain or headache.   ALPHA LIPOIC ACID PO Take by mouth.   escitalopram (LEXAPRO) 20 MG tablet TAKE 1 TABLET(20 MG) BY MOUTH TWICE DAILY   L-ARGININE PO Take by mouth.   lisinopril (ZESTRIL) 10 MG tablet Take 1 tablet (10 mg total) by mouth daily.   Melatonin 5 MG TABS Take 2 tablets by mouth at bedtime as needed (sleep).   metFORMIN (GLUCOPHAGE) 500 MG tablet Take 500 mg by mouth 2 (two) times daily with a meal.   Multiple Vitamin (MULTIVITAMIN WITH MINERALS) TABS tablet  Take 1 tablet by mouth daily.   Norethindrone Acetate-Ethinyl Estrad-FE (LOESTRIN 24 FE) 1-20 MG-MCG(24) tablet Take 1 tablet by mouth daily.   valACYclovir (VALTREX) 1000 MG tablet Take 2 tablets by mouth 2 (two) times daily as needed (cold sores).    No facility-administered encounter medications on file as of 04/10/2021.     Scribe for Treatment Team: Marinda Elk, LCSW

## 2021-04-17 ENCOUNTER — Ambulatory Visit (INDEPENDENT_AMBULATORY_CARE_PROVIDER_SITE_OTHER): Payer: Medicaid Other | Admitting: Licensed Clinical Social Worker

## 2021-04-17 ENCOUNTER — Other Ambulatory Visit: Payer: Self-pay

## 2021-04-17 DIAGNOSIS — F320 Major depressive disorder, single episode, mild: Secondary | ICD-10-CM

## 2021-04-29 ENCOUNTER — Other Ambulatory Visit: Payer: Self-pay

## 2021-04-29 ENCOUNTER — Other Ambulatory Visit (HOSPITAL_COMMUNITY)
Admission: RE | Admit: 2021-04-29 | Discharge: 2021-04-29 | Disposition: A | Discharge status: Home / Self Care | Payer: Medicaid Other | Source: Ambulatory Visit | Attending: Adult Health | Admitting: Adult Health

## 2021-04-29 ENCOUNTER — Ambulatory Visit (INDEPENDENT_AMBULATORY_CARE_PROVIDER_SITE_OTHER): Payer: Medicaid Other | Admitting: Adult Health

## 2021-04-29 ENCOUNTER — Encounter: Payer: Self-pay | Admitting: Adult Health

## 2021-04-29 VITALS — BP 128/84 | HR 69 | Ht 65.0 in | Wt 286.0 lb

## 2021-04-29 DIAGNOSIS — Z3041 Encounter for surveillance of contraceptive pills: Secondary | ICD-10-CM | POA: Diagnosis not present

## 2021-04-29 DIAGNOSIS — Z0000 Encounter for general adult medical examination without abnormal findings: Secondary | ICD-10-CM | POA: Insufficient documentation

## 2021-04-29 MED ORDER — NORETHIN ACE-ETH ESTRAD-FE 1-20 MG-MCG(24) PO TABS: 1.0000 | ORAL_TABLET | Freq: Every day | ORAL | 11 refills | Status: DC

## 2021-04-29 NOTE — Progress Notes (Signed)
Patient ID: Kathy Rios, female   DOB: [birth year], 20 y.o.   MRN: [id number] History of Present Illness: Kathy Rios is a 20 year old black female,single, G0P0, in for well woman gyn exam and pap. PCP is Kathy Axon NP    Current Medications, Allergies, Past Medical History, Past Surgical History, Family History and Social History were reviewed in [name] record.     Review of Systems: Patient denies any headaches, hearing loss, fatigue, blurred vision, shortness of breath, chest pain, abdominal pain, problems with bowel movements, urination, or intercourse. No joint pain or mood swings.  Has lost some weight Periods lighter on OCs and diet changes   Physical Exam:BP 128/84 (BP Location: Left Arm, Patient Position: Sitting, Cuff Size: Normal)   Pulse 69   Ht 5\' 5"  (1.651 m)   Wt 286 lb (129.7 kg)   LMP 04/26/2021   BMI 47.59 kg/m   General:  Well developed, well nourished, no acute distress Skin:  Warm and dry Neck:  Midline trachea, normal thyroid, good ROM, no lymphadenopathy Lungs; Clear to auscultation bilaterally Breast:  No dominant palpable mass, retraction, or nipple discharge Cardiovascular: Regular rate and rhythm Abdomen:  Soft, non tender, no hepatosplenomegaly Pelvic:  External genitalia is normal in appearance, no lesions.  The vagina is normal in appearance. Urethra has no lesions or masses. The cervix is everted at os, pap with GC/CHL performed.  Uterus is felt to be normal size, shape, and contour.  No adnexal masses or tenderness noted.Bladder is non tender, no masses felt Extremities/musculoskeletal:  No swelling or varicosities noted, no clubbing or cyanosis Psych:  No mood changes, alert and cooperative,seems happy AA is 3 Fall risk is low Depression screen [facility] 2/9 04/29/2021 03/28/2021 02/25/2021  Decreased Interest 3 3 0  Down, Depressed, Hopeless 3 2 2   PHQ - 2 Score 6 5 2   Altered sleeping 2 3 1   Tired, decreased energy 3 2 1    Change in appetite 2 1 1   Feeling bad or failure about yourself  1 1 1   Trouble concentrating 3 3 1   Moving slowly or fidgety/restless 2 2 1   Suicidal thoughts 1 1 1   PHQ-9 Score 20 18 9   Difficult doing work/chores - Somewhat difficult Very difficult   To see [facility] GAD 7 : Generalized Anxiety Score 04/29/2021 03/28/2021  Nervous, Anxious, on Edge 3 2  Control/stop worrying 3 2  Worry too much - different things 3 2  Trouble relaxing 2 3  Restless 2 1  Easily annoyed or irritable 2 2  Afraid - awful might happen 2 2  Total GAD 7 Score 17 14  Anxiety Difficulty - Very difficult    Upstream - 04/29/21 1129       Pregnancy Intention Screening   Does the patient want to become pregnant in the next year? No    Does the patient's partner want to become pregnant in the next year? No    Would the patient like to discuss contraceptive options today? No      Contraception Wrap Up   Current Method Oral Contraceptive    End Method Oral Contraceptive    Contraception Counseling Provided No            Examination chaperoned by LPN     Impression and Plan: 1. Routine general medical examination at a health care facility Pap sent Physical in 1 year Pap in 3 if normal  2. Encounter for surveillance of contraceptive pills  Meds ordered this encounter  Medications   Norethindrone Acetate-Ethinyl Estrad-FE (LOESTRIN 24 FE) 1-20 MG-MCG(24) tablet    Sig: Take 1 tablet by mouth daily.    Dispense:  28 tablet    Refill:  11    Order Specific Question:   Supervising Provider    Answer:   Despina Hidden, LUTHER H [[id number]]     3. Morbid obesity (HCC) Continue weight loss efforts Get labs for PCP

## 2021-05-06 LAB — CYTOLOGY - PAP
Chlamydia: NEGATIVE
Comment: NEGATIVE
Comment: NORMAL
Diagnosis: NEGATIVE
Neisseria Gonorrhea: NEGATIVE

## 2021-05-19 NOTE — Progress Notes (Signed)
No show

## 2021-05-19 NOTE — BH Specialist Note (Signed)
 [facility] Follow Up Assessment  MRN: [id number] NAME: Kathy Rios Date: 05/19/21  Start time: 1230 End time: 1 Total time: 30  Type of Contact: Follow up Call  Current concerns/stressors: poor mood  Screens/Assessment Tools:  PHQ9 SCORE ONLY 04/29/2021 03/28/2021 02/25/2021  PHQ-9 Total Score 20 18 9     GAD 7 : Generalized Anxiety Score 04/29/2021 03/28/2021  Nervous, Anxious, on Edge 3 2  Control/stop worrying 3 2  Worry too much - different things 3 2  Trouble relaxing 2 3  Restless 2 1  Easily annoyed or irritable 2 2  Afraid - awful might happen 2 2  Total GAD 7 Score 17 14  Anxiety Difficulty - Very difficult      Functional Assessment:  Sleep: poor Appetite: poor Coping ability: exhausted Patient taking medications as prescribed:    Current medications:  Outpatient Encounter Medications as of 04/04/2021  Medication Sig   acetaminophen (TYLENOL) 500 MG tablet Take 1,000 mg by mouth every 6 (six) hours as needed for mild pain or headache.   ALPHA LIPOIC ACID PO Take by mouth.   L-ARGININE PO Take by mouth.   lisinopril (ZESTRIL) 10 MG tablet Take 1 tablet (10 mg total) by mouth daily.   Melatonin 5 MG TABS Take 2 tablets by mouth at bedtime as needed (sleep).   metFORMIN (GLUCOPHAGE) 500 MG tablet Take 500 mg by mouth 2 (two) times daily with a meal.   Multiple Vitamin (MULTIVITAMIN WITH MINERALS) TABS tablet Take 1 tablet by mouth daily.   valACYclovir (VALTREX) 1000 MG tablet Take 2 tablets by mouth 2 (two) times daily as needed (cold sores).    [DISCONTINUED] escitalopram (LEXAPRO) 20 MG tablet TAKE 1 TABLET(20 MG) BY MOUTH TWICE DAILY   [DISCONTINUED] Norethindrone Acetate-Ethinyl Estrad-FE (LOESTRIN 24 FE) 1-20 MG-MCG(24) tablet Take 1 tablet by mouth daily.   No facility-administered encounter medications on file as of 04/04/2021.    Self-harm and/or Suicidal Behaviors Risk Assessment Self-harm risk factors: no Patient endorses recent self  injurious thoughts and/or behaviors: No   Suicide ideations: No plan to harm self or others   Danger to Others Risk Assessment Danger to others risk factors: no Patient endorses recent thoughts of harming others: No    Substance Use Assessment Patient recently consumed alcohol: No  Patient recently used drugs: No  Patient is concerned about dependence or abuse of substances: No    Goals, Interventions and Follow-up Plan Goals: Increase healthy adjustment to current life circumstances Interventions: Behavioral Activation   Summary of Clinical Assessment  04/06/2021 is a 20 yr old woman present for follow up.  She reports that she is now working second shift and has a desire to move out of her parents home  She has an increased need for sleep and has poor energy and motivation. Discussion of behavioral activation and mood tracking.  Factors that contribute to client's ongoing depressive symptoms were discussed and include real and perceived feelings of isolation, criticism, rejection, shame and guilt.    Follow-up Plan:  weekly VBH service 36, LCSW

## 2021-05-19 NOTE — Progress Notes (Signed)
Left message encouraging contact 

## 2021-05-24 LAB — CMP14+EGFR
ALT: 11 IU/L (ref 0–32)
AST: 13 IU/L (ref 0–40)
Albumin/Globulin Ratio: 1.3 (ref 1.2–2.2)
Albumin: 4.1 g/dL (ref 3.9–5.0)
Alkaline Phosphatase: 98 IU/L (ref 44–121)
BUN/Creatinine Ratio: 12 (ref 9–23)
BUN: 8 mg/dL (ref 6–20)
Bilirubin Total: 0.3 mg/dL (ref 0.0–1.2)
CO2: 19 mmol/L — ABNORMAL LOW (ref 20–29)
Calcium: 9.4 mg/dL (ref 8.7–10.2)
Chloride: 103 mmol/L (ref 96–106)
Creatinine, Ser: 0.65 mg/dL (ref 0.57–1.00)
Globulin, Total: 3.1 g/dL (ref 1.5–4.5)
Glucose: 95 mg/dL (ref 70–99)
Potassium: 4.4 mmol/L (ref 3.5–5.2)
Sodium: 138 mmol/L (ref 134–144)
Total Protein: 7.2 g/dL (ref 6.0–8.5)
eGFR: 128 mL/min/{1.73_m2} (ref >59)

## 2021-05-24 LAB — LIPID PANEL WITH LDL/HDL RATIO
Cholesterol, Total: 179 mg/dL (ref 100–199)
HDL: 52 mg/dL (ref >39)
LDL Chol Calc (NIH): 109 mg/dL — ABNORMAL HIGH (ref 0–99)
LDL/HDL Ratio: 2.1 ratio (ref 0.0–3.2)
Triglycerides: 102 mg/dL (ref 0–149)
VLDL Cholesterol Cal: 18 mg/dL (ref 5–40)

## 2021-05-24 LAB — CBC WITH DIFFERENTIAL/PLATELET
Basophils Absolute: 0 10*3/uL (ref 0.0–0.2)
Basos: 1 %
EOS (ABSOLUTE): 0.1 10*3/uL (ref 0.0–0.4)
Eos: 1 %
Hematocrit: 35.1 % (ref 34.0–46.6)
Hemoglobin: 10.7 g/dL — ABNORMAL LOW (ref 11.1–15.9)
Immature Grans (Abs): 0 10*3/uL (ref 0.0–0.1)
Immature Granulocytes: 0 %
Lymphocytes Absolute: 2.1 10*3/uL (ref 0.7–3.1)
Lymphs: 33 %
MCH: 20.9 pg — ABNORMAL LOW (ref 26.6–33.0)
MCHC: 30.5 g/dL — ABNORMAL LOW (ref 31.5–35.7)
MCV: 69 fL — ABNORMAL LOW (ref 79–97)
Monocytes Absolute: 0.5 10*3/uL (ref 0.1–0.9)
Monocytes: 8 %
Neutrophils Absolute: 3.6 10*3/uL (ref 1.4–7.0)
Neutrophils: 57 %
Platelets: 370 10*3/uL (ref 150–450)
RBC: 5.12 x10E6/uL (ref 3.77–5.28)
RDW: 16.3 % — ABNORMAL HIGH (ref 11.7–15.4)
WBC: 6.2 10*3/uL (ref 3.4–10.8)

## 2021-05-24 LAB — HEMOGLOBIN A1C
Est. average glucose Bld gHb Est-mCnc: 126 mg/dL
Hgb A1c MFr Bld: 6 % — ABNORMAL HIGH (ref 4.8–5.6)

## 2021-05-24 LAB — VITAMIN B12: Vitamin B-12: 401 pg/mL (ref 232–1245)

## 2021-05-24 NOTE — Progress Notes (Signed)
Her anemia is stable, but I am adding an iron panel to her labs.

## 2021-05-29 LAB — IRON AND TIBC

## 2021-06-03 ENCOUNTER — Emergency Department (HOSPITAL_COMMUNITY)
Admission: EM | Admit: 2021-06-03 | Discharge: 2021-06-05 | Disposition: A | Discharge status: Other Acute Inpatient Hospital | Payer: Medicaid Other | Attending: Emergency Medicine | Admitting: Emergency Medicine

## 2021-06-03 ENCOUNTER — Other Ambulatory Visit: Payer: Self-pay

## 2021-06-03 ENCOUNTER — Encounter (HOSPITAL_COMMUNITY): Payer: Self-pay | Admitting: *Deleted

## 2021-06-03 DIAGNOSIS — I10 Essential (primary) hypertension: Secondary | ICD-10-CM | POA: Diagnosis not present

## 2021-06-03 DIAGNOSIS — Z7984 Long term (current) use of oral hypoglycemic drugs: Secondary | ICD-10-CM | POA: Insufficient documentation

## 2021-06-03 DIAGNOSIS — E119 Type 2 diabetes mellitus without complications: Secondary | ICD-10-CM | POA: Diagnosis not present

## 2021-06-03 DIAGNOSIS — Z20822 Contact with and (suspected) exposure to covid-19: Secondary | ICD-10-CM | POA: Insufficient documentation

## 2021-06-03 DIAGNOSIS — R443 Hallucinations, unspecified: Secondary | ICD-10-CM | POA: Diagnosis not present

## 2021-06-03 DIAGNOSIS — Z79899 Other long term (current) drug therapy: Secondary | ICD-10-CM | POA: Diagnosis not present

## 2021-06-03 LAB — CBC WITH DIFFERENTIAL/PLATELET
Abs Immature Granulocytes: 0.02 10*3/uL (ref 0.00–0.07)
Basophils Absolute: 0.1 10*3/uL (ref 0.0–0.1)
Basophils Relative: 1 %
Eosinophils Absolute: 0.1 10*3/uL (ref 0.0–0.5)
Eosinophils Relative: 1 %
HCT: 34.7 % — ABNORMAL LOW (ref 36.0–46.0)
Hemoglobin: 10.4 g/dL — ABNORMAL LOW (ref 12.0–15.0)
Immature Granulocytes: 0 %
Lymphocytes Relative: 35 %
Lymphs Abs: 3.1 10*3/uL (ref 0.7–4.0)
MCH: 21.1 pg — ABNORMAL LOW (ref 26.0–34.0)
MCHC: 30 g/dL (ref 30.0–36.0)
MCV: 70.5 fL — ABNORMAL LOW (ref 80.0–100.0)
Monocytes Absolute: 0.8 10*3/uL (ref 0.1–1.0)
Monocytes Relative: 9 %
Neutro Abs: 4.7 10*3/uL (ref 1.7–7.7)
Neutrophils Relative %: 54 %
Platelets: 371 10*3/uL (ref 150–400)
RBC: 4.92 MIL/uL (ref 3.87–5.11)
RDW: 16.9 % — ABNORMAL HIGH (ref 11.5–15.5)
WBC: 8.8 10*3/uL (ref 4.0–10.5)
nRBC: 0 % (ref 0.0–0.2)

## 2021-06-03 LAB — COMPREHENSIVE METABOLIC PANEL
ALT: 15 U/L (ref 0–44)
AST: 14 U/L — ABNORMAL LOW (ref 15–41)
Albumin: 3.6 g/dL (ref 3.5–5.0)
Alkaline Phosphatase: 89 U/L (ref 38–126)
Anion gap: 6 (ref 5–15)
BUN: 11 mg/dL (ref 6–20)
CO2: 24 mmol/L (ref 22–32)
Calcium: 8.8 mg/dL — ABNORMAL LOW (ref 8.9–10.3)
Chloride: 107 mmol/L (ref 98–111)
Creatinine, Ser: 0.66 mg/dL (ref 0.44–1.00)
GFR, Estimated: >60 mL/min (ref >60)
Glucose, Bld: 110 mg/dL — ABNORMAL HIGH (ref 70–99)
Potassium: 3.9 mmol/L (ref 3.5–5.1)
Sodium: 137 mmol/L (ref 135–145)
Total Bilirubin: 0.2 mg/dL — ABNORMAL LOW (ref 0.3–1.2)
Total Protein: 7.8 g/dL (ref 6.5–8.1)

## 2021-06-03 LAB — RESP PANEL BY RT-PCR (FLU A&B, COVID) ARPGX2
Influenza A by PCR: NEGATIVE
Influenza B by PCR: NEGATIVE
SARS Coronavirus 2 by RT PCR: NEGATIVE

## 2021-06-03 LAB — ETHANOL: Alcohol, Ethyl (B): <10 mg/dL (ref <10)

## 2021-06-03 NOTE — ED Triage Notes (Signed)
Unable to control emotions for the past week, states she has been seeing things and hearing voices

## 2021-06-03 NOTE — ED Provider Notes (Signed)
[facility] EMERGENCY DEPARTMENT Provider Note   CSN: [id number] Arrival date & time: 06/03/21  1751     History Chief Complaint  Patient presents with   Hallucinations    Kathy Rios is a 20 y.o. female.  HPI     Is a 20 year old female with history of diabetes, hypertension, depression who presents with paranoia and hallucinations.  Patient reports several months history of worsening paranoia and hallucinations.  She states that she sees dead ancestors.  She also states that she hears voices that are "some good and some bad."  She reports prior history of cutting behavior.  When asked if she was suicidal, patient states that sometimes she thinks she may just leave here and jump off a bridge.  She has never been hospitalized for this.  She does report previously seeing a psychiatrist but no formal diagnosis of psychosis or schizophrenia.  She was previously on antidepressants.  She reports occasional alcohol use.  Denies drug use.  Past Medical History:  Diagnosis Date   ADD (attention deficit disorder)    ADHD (attention deficit hyperactivity disorder)    Anemia    Anxiety    Depression    Diabetes mellitus    Diabetes mellitus, type II (HCC)    Encounter for menstrual regulation 01/25/2016   Hypertension    Irregular periods 10/25/2015   Obesity    PMS (premenstrual syndrome) 11/05/2015   Reactive hypoglycemia    Severe menstrual cramps 11/05/2015   Thalassemia trait    Thalassemia trait, alpha     Patient Active Problem List   Diagnosis Date Noted   Routine general medical examination at a health care facility 04/29/2021   Encounter for surveillance of contraceptive pills 04/29/2021   Morbid obesity (HCC) 04/29/2021   Seasonal allergies 02/25/2021   Food allergy 02/25/2021   BMI 40.0-44.9, adult (HCC) 08/16/2018   Depression 03/22/2018   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   Chronic constipation 03/08/2018   Acanthosis nigricans 01/28/2017   Prediabetes 07/01/2016    Encounter for menstrual regulation 01/25/2016   Severe menstrual cramps 11/05/2015   PMS (premenstrual syndrome) 11/05/2015   Irregular periods 10/25/2015   Attention deficit hyperactivity disorder (ADHD), predominantly inattentive type 12/23/2014   Tinea versicolor 12/23/2014   PCOS (polycystic ovarian syndrome) 11/13/2014   Alpha thalassemia trait 08/20/2012   Essential hypertension 08/20/2012    Past Surgical History:  Procedure Laterality Date   ADENOIDECTOMY     TONSILLECTOMY     WISDOM TOOTH EXTRACTION  07/2016     OB History     Gravida  0   Para  0   Term  0   Preterm  0   AB  0   Living  0      SAB  0   IAB  0   Ectopic  0   Multiple  0   Live Births              Family History  Problem Relation Age of Onset   Seizures Mother    Arthritis Mother    Diabetes Mother    Hyperlipidemia Mother    Depression Mother    Hyperlipidemia Father    Hypertension Father    Gout Father    Dementia Father    Hypertension Sister    Mental illness Brother    Hyperlipidemia Brother    ADD / ADHD Brother    Depression Brother    Hypertension Maternal Grandmother    Cancer Maternal Grandmother  Hypertension Maternal Grandfather    Thyroid disease Maternal Grandfather    Cancer Maternal Grandfather    Prostate cancer Maternal Grandfather    Anemia Paternal Grandmother    Cancer Paternal Grandmother        cervical   Hypertension Paternal Grandfather    Heart disease Paternal Grandfather    Hypertension Other    Other Other        heart skips-maternal great grandma   Other Other        fibroids- maternal grandma and great grandma   Heart disease Other    Schizophrenia Maternal Aunt    Bipolar disorder Maternal Aunt    Alcohol abuse Maternal Uncle     Social History   Tobacco Use   Smoking status: Never   Smokeless tobacco: Never  Vaping Use   Vaping Use: Never used  Substance Use Topics   Alcohol use: Yes    Comment: "sometimes"    Drug use: No    Home Medications Prior to Admission medications   Medication Sig Start Date End Date Taking? Authorizing Provider  acetaminophen (TYLENOL) 500 MG tablet Take 1,000 mg by mouth every 6 (six) hours as needed for mild pain or headache.   Yes [provider]  b complex vitamins capsule Take 1 capsule by mouth daily.   Yes [provider]  Ginkgo Biloba 40 MG TABS Take 1 tablet by mouth daily.   Yes [provider]  L-ARGININE PO Take by mouth.   Yes [provider]  lisinopril (ZESTRIL) 10 MG tablet Take 1 tablet (10 mg total) by mouth daily. 02/02/21  Yes [place], [place], PA-C  Melatonin 5 MG TABS Take 2 tablets by mouth at bedtime as needed (sleep).   Yes [provider]  metFORMIN (GLUCOPHAGE) 500 MG tablet Take 500 mg by mouth 2 (two) times daily with a meal.   Yes [provider]  Multiple Vitamin (MULTIVITAMIN WITH MINERALS) TABS tablet Take 1 tablet by mouth daily.   Yes [provider]  Norethindrone Acetate-Ethinyl Estrad-FE (LOESTRIN 24 FE) 1-20 MG-MCG(24) tablet Take 1 tablet by mouth daily. 04/29/21  Yes Cyril Mourning A, NP  UNABLE TO FIND Iwi-takes 1 tab BID   Yes [provider]  valACYclovir (VALTREX) 1000 MG tablet Take 2 tablets by mouth 2 (two) times daily as needed (cold sores).  09/22/15  Yes [provider]  ALPHA LIPOIC ACID PO Take by mouth. Patient not taking: Reported on 06/03/2021    [provider]    Allergies    Selenium sulfide, Motrin [ibuprofen], Other, and Sulfa antibiotics  Review of Systems   Review of Systems  Constitutional:  Negative for fever.  Respiratory:  Negative for shortness of breath.   Cardiovascular:  Negative for chest pain.  Gastrointestinal:  Negative for abdominal pain.  Psychiatric/Behavioral:  Positive for hallucinations and suicidal ideas. The patient is nervous/anxious.   All other systems reviewed and are negative.  Physical  Exam Updated Vital Signs BP 138/80 (BP Location: Right Arm)   Pulse 93   Temp 98.5 F (36.9 C) (Oral)   Resp (!) 22   Ht 1.651 m (5\' 5" ) Comment: Simultaneous filing. User may not have seen previous data.  Wt 129.3 kg   SpO2 94%   BMI 47.44 kg/m   Physical Exam Vitals and nursing note reviewed.  Constitutional:      Appearance: She is well-developed. She is obese.  HENT:     Head: Normocephalic and atraumatic.  Nose: Nose normal.     Mouth/Throat:     Mouth: Mucous membranes are moist.  Eyes:     Pupils: Pupils are equal, round, and reactive to light.  Cardiovascular:     Rate and Rhythm: Normal rate and regular rhythm.  Pulmonary:     Effort: Pulmonary effort is normal. No respiratory distress.  Abdominal:     Palpations: Abdomen is soft.  Musculoskeletal:     Cervical back: Neck supple.  Skin:    General: Skin is warm and dry.  Neurological:     Mental Status: She is alert and oriented to person, place, and time.  Psychiatric:     Comments: Cooperative    ED Results / Procedures / Treatments   Labs (all labs ordered are listed, but only abnormal results are displayed) Labs Reviewed  COMPREHENSIVE METABOLIC PANEL - Abnormal; Notable for the following components:      Result Value   Glucose, Bld 110 (*)    Calcium 8.8 (*)    AST 14 (*)    Total Bilirubin 0.2 (*)    All other components within normal limits  CBC WITH DIFFERENTIAL/PLATELET - Abnormal; Notable for the following components:   Hemoglobin 10.4 (*)    HCT 34.7 (*)    MCV 70.5 (*)    MCH 21.1 (*)    RDW 16.9 (*)    All other components within normal limits  RESP PANEL BY RT-PCR (FLU A&B, COVID) ARPGX2  ETHANOL  RAPID URINE DRUG SCREEN, HOSP PERFORMED  URINALYSIS, ROUTINE W REFLEX MICROSCOPIC  POC URINE PREG, ED    EKG None  Radiology No results found.  Procedures Procedures   Medications Ordered in ED Medications - No data to display  ED Course  I have reviewed the triage vital  signs and the nursing notes.  Pertinent labs & imaging results that were available during my care of the patient were reviewed by me and considered in my medical decision making (see chart for details).    MDM Rules/Calculators/A&P                          \ Patient presents expressing concerns of hallucinations and SI.  She is nontoxic and cooperative.  Vital signs are reassuring.  She denies any physical complaints.  Does not meet IVC criteria.  Patient medically cleared for TTS evaluation.   Final Clinical Impression(s) / ED Diagnoses Final diagnoses:  None    Rx / DC Orders ED Discharge Orders     None        Fritzi Scripter, Mayer Masker, MD 06/03/21 [phone]

## 2021-06-03 NOTE — ED Provider Notes (Signed)
Emergency Medicine Provider Triage Evaluation Note  Kathy Rios , a 20 y.o. female  was evaluated in triage.  Pt complains of an health crisis.  Patient says she struggled with depression anxiety since she is 20 years old.  However, 1 week ago she started having paranoid thoughts.  She is seeing things that are not there including dad ancestors.  She has hearing voices telling hurt herself.  Denies having any thoughts of hurting other people or hearing voices telling her to hurt other people.  She does report that she had a prior suicide attempt where she attempted to cut her wrist as a high school.  Has not had any attempts since then.  She is not currently on any antidepressants.  She has a family history of an aunt with schizophrenia and bipolar.  No history of the same for the patient..  Review of Systems  Positive: Paranoid thoughts, auditory hallucinations, depression, suicidal ideations Negative: Homicidal ideations  Physical Exam  BP 138/80 (BP Location: Right Arm)   Pulse 93   Temp 98.5 F (36.9 C) (Oral)   Resp (!) 22   Ht 5\' 5"  (1.651 m) Comment: Simultaneous filing. User may not have seen previous data.  Wt 129.3 kg   SpO2 94%   BMI 47.44 kg/m  Gen:   Awake, no distress   Resp:  Normal effort  MSK:   Moves extremities without difficulty  Other:  Patient thoughtful, does not appear to be responding to internal stimuli's.  Medical Decision Making  Medically screening exam initiated at 6:51 PM.  Appropriate orders placed.  [name] was informed that the remainder of the evaluation will be completed by another provider, this initial triage assessment does not replace that evaluation, and the importance of remaining in the ED until their evaluation is complete.  Patient will need to be medically cleared and then seen by psychiatry.  We contracted for safety, patient agreeable to go through the process of waiting in the ER before being seen in the back.  Patient's mother is  with her and agrees that they will wait to be seen in the back..  Do not think patient requires IVC at this time.   [name], PA-C 06/03/21 06/05/21    Carlis Stable, MD 06/11/21 1055

## 2021-06-03 NOTE — ED Triage Notes (Signed)
Wanded in triage 

## 2021-06-04 ENCOUNTER — Emergency Department (HOSPITAL_COMMUNITY): Payer: Medicaid Other

## 2021-06-04 LAB — RAPID URINE DRUG SCREEN, HOSP PERFORMED
Amphetamines: NOT DETECTED
Barbiturates: NOT DETECTED
Benzodiazepines: NOT DETECTED
Cocaine: NOT DETECTED
Opiates: NOT DETECTED
Tetrahydrocannabinol: NOT DETECTED

## 2021-06-04 LAB — URINALYSIS, ROUTINE W REFLEX MICROSCOPIC
Bacteria, UA: NONE SEEN
Bilirubin Urine: NEGATIVE
Glucose, UA: NEGATIVE mg/dL
Ketones, ur: NEGATIVE mg/dL
Nitrite: NEGATIVE
Protein, ur: NEGATIVE mg/dL
Specific Gravity, Urine: 1.021 (ref 1.005–1.030)
pH: 7 (ref 5.0–8.0)

## 2021-06-04 LAB — PREGNANCY, URINE: Preg Test, Ur: NEGATIVE

## 2021-06-04 MED ORDER — MELATONIN 5 MG PO TABS
10.0000 mg | ORAL_TABLET | Freq: Every evening | ORAL | Status: DC | PRN
  Filled 2021-06-04: qty 2

## 2021-06-04 MED ORDER — LORAZEPAM 1 MG PO TABS
1.0000 mg | ORAL_TABLET | Freq: Two times a day (BID) | ORAL | Status: DC | PRN
  Administered 2021-06-04: 1 mg via ORAL
  Filled 2021-06-04: qty 1

## 2021-06-04 MED ORDER — LORAZEPAM 2 MG/ML IJ SOLN: 1.0000 mg | Freq: Two times a day (BID) | INTRAMUSCULAR | Status: DC | PRN

## 2021-06-04 MED ORDER — ZIPRASIDONE HCL 20 MG PO CAPS
20.0000 mg | ORAL_CAPSULE | Freq: Two times a day (BID) | ORAL | Status: DC
  Administered 2021-06-04: 20 mg via ORAL
  Filled 2021-06-04: qty 1

## 2021-06-04 MED ORDER — LISINOPRIL 10 MG PO TABS
10.0000 mg | ORAL_TABLET | Freq: Every day | ORAL | Status: DC
  Administered 2021-06-04: 10 mg via ORAL
  Filled 2021-06-04: qty 1

## 2021-06-04 MED ORDER — MELATONIN 3 MG PO TABS
9.0000 mg | ORAL_TABLET | Freq: Every evening | ORAL | Status: DC | PRN
  Administered 2021-06-04: 9 mg via ORAL
  Filled 2021-06-04: qty 3

## 2021-06-04 MED ORDER — ACETAMINOPHEN 500 MG PO TABS: 1000.0000 mg | ORAL_TABLET | Freq: Four times a day (QID) | ORAL | Status: DC | PRN

## 2021-06-04 MED ORDER — NORETHIN ACE-ETH ESTRAD-FE 1-20 MG-MCG(24) PO TABS: 1.0000 | ORAL_TABLET | Freq: Every day | ORAL | Status: DC

## 2021-06-04 MED ORDER — METFORMIN HCL 500 MG PO TABS: 500.0000 mg | ORAL_TABLET | Freq: Two times a day (BID) | ORAL | Status: DC

## 2021-06-04 NOTE — ED Provider Notes (Signed)
Received from LCSW:   Accepted at [facility] tomorrow 06/05/21 after 8:00am. Accepting provider: Cherrie Distance, PA.  Report to be called at [phone]. Pty will admit to the [facility] unit/ Woman Unit. CSW spoke with Victorino Dike who provided accepting information.    EMTALA filled out but will need updated.    Marily Memos, MD 06/04/21 [phone]

## 2021-06-04 NOTE — Progress Notes (Signed)
Pt was accept to [facility] for Inpatient Behavioral Health Placement tomorrow 06/05/21 after 08:00am.  Pt meets inpatient criteria per Melbourne Abts, PA-C  Attending Physician will be Cherrie Distance, PA.    Report can be called to: [phone]  Accepted at [facility] tomorrow 06/05/21 after 8:00am. Accepting provider: Cherrie Distance, PA.  Report to be called at [phone]. Pty will admit to the [facility] unit/ Woman Unit. CSW spoke with Victorino Dike who provided accepting information.  Care Team notified via secure chat: Kerrie Pleasure, RN and CSW requested that nursing add EDP to chat so they are aware of update disposition. CSW also request that nursing coordinate transportation. Marily Memos, MD added to chat and verbalized conformation and requested that morning provider is aware so that EMTALA information can be updated.   Kelton Pillar, LCSWA 06/04/2021 @ 11:40 PM

## 2021-06-04 NOTE — Progress Notes (Signed)
Patient has been denied by [facility] due to no appropriate thought disorder beds being available. Patient meets criteria for [facility] inpatient per Melbourne Abts, PA-C. Patient has been faxed out to the following facilities:   [facility]  [address] [place] [address] [phone]  [facility]  [address], [place] [address] [phone]  [facility]  [address] [place] [address] [phone]  [facility]  [address] [place] [address] [phone]  [facility]  [address] [place] [address] [phone]  [facility]  [address] [place], [place] [address] [phone]  [facility]  [address] [place], [place] [address] [phone]  [facility]  [phone] [place] [address] [phone]  [facility]  [address] [place] [address] [phone]  [facility]  [address], [place] [address] [phone]  [facility]  [address] [place] [address] [phone]  [facility]  [address] [place] [address] [phone]   Damita Dunnings, MSW, LCSW-A  9:47 AM 06/04/2021
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
## 2021-06-04 NOTE — Progress Notes (Addendum)
Follow-Up with [facility] for inpatient Behavioral Health Placement.   CSW spoke with Susie from Intake with [facility] [phone] who advised that the facility is requesting pt's height and weight along with confirmation that pt will have transportation to their facility. CSW advised that our hospital will coordinate transport to their facility but not transport from their facility. Susie advised that transportation must be confirmed for when pt is completed with treatment. CSW informed that CSW would contact pt's mother.   @6 :03pm CSW contacted pt's mother, Cabrini Ruggieri at [phone]. Pt's mother agreed to pick up her daughter from [facility]. CSW faxed requested documents and is awaiting to for follow-up from facility. CSW will continue to assist and follow for inpatient behavioral health placement.    [phone], MSW, [facility] 06/04/2021 6:19 PM

## 2021-06-04 NOTE — ED Notes (Signed)
Pt sleeping. 

## 2021-06-05 NOTE — ED Notes (Signed)
Report given to Clydie Braun, Charity fundraiser.  Pt transferred to [facility], accepting [place], [place] [phone].

## 2021-06-05 NOTE — ED Notes (Signed)
Pt woke up from her sleep and stated that she is allergic to meat, dairy and eggs. This Clinical research associate told pt that once breakfast arrives, this Clinical research associate can fix their tray. Will continue to monitor pt.

## 2021-06-06 LAB — SPECIMEN STATUS REPORT

## 2021-06-06 LAB — FERRITIN: Ferritin: 98 ng/mL (ref 15–150)

## 2021-07-19 ENCOUNTER — Other Ambulatory Visit: Payer: Self-pay | Admitting: Adult Health

## 2021-09-23 ENCOUNTER — Emergency Department (HOSPITAL_COMMUNITY)
Admission: EM | Admit: 2021-09-23 | Discharge: 2021-09-24 | Disposition: A | Discharge status: Home / Self Care | Payer: Medicaid Other | Attending: Emergency Medicine | Admitting: Emergency Medicine

## 2021-09-23 ENCOUNTER — Other Ambulatory Visit: Payer: Self-pay

## 2021-09-23 ENCOUNTER — Encounter (HOSPITAL_COMMUNITY): Payer: Self-pay | Admitting: *Deleted

## 2021-09-23 ENCOUNTER — Emergency Department (HOSPITAL_COMMUNITY): Payer: Medicaid Other

## 2021-09-23 DIAGNOSIS — Y900 Blood alcohol level of less than 20 mg/100 ml: Secondary | ICD-10-CM | POA: Insufficient documentation

## 2021-09-23 DIAGNOSIS — I10 Essential (primary) hypertension: Secondary | ICD-10-CM | POA: Insufficient documentation

## 2021-09-23 DIAGNOSIS — F32A Depression, unspecified: Secondary | ICD-10-CM | POA: Diagnosis not present

## 2021-09-23 DIAGNOSIS — E119 Type 2 diabetes mellitus without complications: Secondary | ICD-10-CM | POA: Diagnosis not present

## 2021-09-23 DIAGNOSIS — R45851 Suicidal ideations: Secondary | ICD-10-CM | POA: Diagnosis not present

## 2021-09-23 DIAGNOSIS — F39 Unspecified mood [affective] disorder: Secondary | ICD-10-CM | POA: Diagnosis not present

## 2021-09-23 DIAGNOSIS — F259 Schizoaffective disorder, unspecified: Secondary | ICD-10-CM | POA: Insufficient documentation

## 2021-09-23 HISTORY — DX: Schizoaffective disorder, depressive type: F25.1

## 2021-09-23 LAB — RAPID URINE DRUG SCREEN, HOSP PERFORMED
Amphetamines: NOT DETECTED
Barbiturates: NOT DETECTED
Benzodiazepines: NOT DETECTED
Cocaine: NOT DETECTED
Opiates: NOT DETECTED
Tetrahydrocannabinol: NOT DETECTED

## 2021-09-23 LAB — CBC WITH DIFFERENTIAL/PLATELET
Abs Immature Granulocytes: 0.03 10*3/uL (ref 0.00–0.07)
Basophils Absolute: 0 10*3/uL (ref 0.0–0.1)
Basophils Relative: 0 %
Eosinophils Absolute: 0.1 10*3/uL (ref 0.0–0.5)
Eosinophils Relative: 1 %
HCT: 36 % (ref 36.0–46.0)
Hemoglobin: 10.6 g/dL — ABNORMAL LOW (ref 12.0–15.0)
Immature Granulocytes: 0 %
Lymphocytes Relative: 24 %
Lymphs Abs: 2.2 10*3/uL (ref 0.7–4.0)
MCH: 20.9 pg — ABNORMAL LOW (ref 26.0–34.0)
MCHC: 29.4 g/dL — ABNORMAL LOW (ref 30.0–36.0)
MCV: 71.1 fL — ABNORMAL LOW (ref 80.0–100.0)
Monocytes Absolute: 0.7 10*3/uL (ref 0.1–1.0)
Monocytes Relative: 8 %
Neutro Abs: 6 10*3/uL (ref 1.7–7.7)
Neutrophils Relative %: 67 %
Platelets: 370 10*3/uL (ref 150–400)
RBC: 5.06 MIL/uL (ref 3.87–5.11)
RDW: 16.3 % — ABNORMAL HIGH (ref 11.5–15.5)
WBC: 9.1 10*3/uL (ref 4.0–10.5)
nRBC: 0 % (ref 0.0–0.2)

## 2021-09-23 LAB — COMPREHENSIVE METABOLIC PANEL
ALT: 24 U/L (ref 0–44)
AST: 17 U/L (ref 15–41)
Albumin: 3.6 g/dL (ref 3.5–5.0)
Alkaline Phosphatase: 75 U/L (ref 38–126)
Anion gap: 8 (ref 5–15)
BUN: 11 mg/dL (ref 6–20)
CO2: 23 mmol/L (ref 22–32)
Calcium: 9.3 mg/dL (ref 8.9–10.3)
Chloride: 104 mmol/L (ref 98–111)
Creatinine, Ser: 0.7 mg/dL (ref 0.44–1.00)
GFR, Estimated: >60 mL/min (ref >60)
Glucose, Bld: 106 mg/dL — ABNORMAL HIGH (ref 70–99)
Potassium: 4 mmol/L (ref 3.5–5.1)
Sodium: 135 mmol/L (ref 135–145)
Total Bilirubin: 0.1 mg/dL — ABNORMAL LOW (ref 0.3–1.2)
Total Protein: 8.1 g/dL (ref 6.5–8.1)

## 2021-09-23 LAB — ACETAMINOPHEN LEVEL: Acetaminophen (Tylenol), Serum: <10 ug/mL — ABNORMAL LOW (ref 10–30)

## 2021-09-23 LAB — SALICYLATE LEVEL: Salicylate Lvl: <7 mg/dL — ABNORMAL LOW (ref 7.0–30.0)

## 2021-09-23 LAB — HCG, QUANTITATIVE, PREGNANCY: hCG, Beta Chain, Quant, S: <1 m[IU]/mL (ref <5)

## 2021-09-23 LAB — ETHANOL: Alcohol, Ethyl (B): <10 mg/dL (ref <10)

## 2021-09-23 MED ORDER — METFORMIN HCL 500 MG PO TABS: 500.0000 mg | ORAL_TABLET | Freq: Two times a day (BID) | ORAL | Status: DC

## 2021-09-23 MED ORDER — LISINOPRIL 10 MG PO TABS: 10.0000 mg | ORAL_TABLET | Freq: Every day | ORAL | Status: DC

## 2021-09-23 MED ORDER — DIVALPROEX SODIUM 250 MG PO DR TAB: 250.0000 mg | DELAYED_RELEASE_TABLET | Freq: Every day | ORAL | Status: DC

## 2021-09-23 MED ORDER — ACETAMINOPHEN 325 MG PO TABS: 650.0000 mg | ORAL_TABLET | ORAL | Status: DC | PRN

## 2021-09-23 MED ORDER — NORETHIN ACE-ETH ESTRAD-FE 1-20 MG-MCG(24) PO TABS: 1.0000 | ORAL_TABLET | Freq: Every day | ORAL | Status: DC

## 2021-09-23 MED ORDER — ARIPIPRAZOLE 2 MG PO TABS
2.0000 mg | ORAL_TABLET | Freq: Every day | ORAL | Status: DC
  Filled 2021-09-23 (×2): qty 1

## 2021-09-23 MED ORDER — ALUM & MAG HYDROXIDE-SIMETH 200-200-20 MG/5ML PO SUSP: 30.0000 mL | Freq: Four times a day (QID) | ORAL | Status: DC | PRN

## 2021-09-23 MED ORDER — ONDANSETRON HCL 4 MG PO TABS: 4.0000 mg | ORAL_TABLET | Freq: Three times a day (TID) | ORAL | Status: DC | PRN

## 2021-09-23 MED ORDER — MELATONIN 5 MG PO TABS
9.0000 mg | ORAL_TABLET | Freq: Every evening | ORAL | Status: DC | PRN
  Filled 2021-09-23: qty 2

## 2021-09-23 NOTE — ED Notes (Signed)
Six medications counted and verified by Fransico Him and placed with patient's belongings in locker room per charge nurse. None of the medications are controlled substances.

## 2021-09-23 NOTE — ED Triage Notes (Addendum)
Pt brought in by [facility] from home with c/o SI with plan to cut her wrist x 2 days. Pt came in voluntarily. EMS reports pt said she has voices talking in her head and they told her to kill herself. Pt reports she has been taking her medication. Pt calm and cooperative in triage.

## 2021-09-23 NOTE — ED Notes (Signed)
Pt has been dressed in maroon scrubs and wanded by security. Patient has a big Pink book bag on the top shelf of the locker room as well as two bags locked in a locker with a name label.

## 2021-09-23 NOTE — ED Provider Notes (Signed)
Emergency Department Provider Note   I have reviewed the triage vital signs and the nursing notes.   HISTORY  Chief Complaint Suicidal   HPI Kathy Rios is a 21 y.o. female with past history reviewed below including schizoaffective disorder, diabetes, and anxiety presents with auditory/visual hallucinations along with suicidal thoughts.  She feels that the symptoms are increasingly intrusive.  She is compliant with her home medications but notes that for the past several weeks they have been weaning off her Depakote and have started her on Abilify.  She is currently taking 250 mg Depakote and has been weaning off since December.  She was started on Abilify on January 19.  She felt like overall the Abilify was a positive change for her but has noticed that over the past several days she had increasingly intrusive thoughts about cutting her wrist.  She has been experiencing both auditory and visual hallucinations which is not unusual for her but are increasingly distressing.  She spoke with her therapist about this today and was referred to the emergency department for evaluation.  She notes her only medical complaint is some clicking and slight pain in the left wrist which is also been present for several weeks.  Denies injury.  Patient is voluntary at this time.   Past Medical History:  Diagnosis Date   ADD (attention deficit disorder)    ADHD (attention deficit hyperactivity disorder)    Anemia    Anxiety    Depression    Diabetes mellitus    Diabetes mellitus, type II (HCC)    Encounter for menstrual regulation 01/25/2016   Hypertension    Irregular periods 10/25/2015   Obesity    PMS (premenstrual syndrome) 11/05/2015   Reactive hypoglycemia    Schizoaffective disorder, depressive type (HCC)    Severe menstrual cramps 11/05/2015   Thalassemia trait    Thalassemia trait, alpha     Review of Systems  Constitutional: No fever/chills Eyes: No visual changes. ENT: No  sore throat. Cardiovascular: Denies chest pain. Respiratory: Denies shortness of breath. Gastrointestinal: No abdominal pain.  No nausea, no vomiting.  No diarrhea.  No constipation. Genitourinary: Negative for dysuria. Musculoskeletal: Negative for back pain. Positive left wrist pain.  Skin: Negative for rash. Neurological: Negative for headaches, focal weakness or numbness. Psych: Positive SI and AH/VH.    ____________________________________________   PHYSICAL EXAM:  VITAL SIGNS: ED Triage Vitals  Enc Vitals Group     BP 09/23/21 1647 134/61     Pulse Rate 09/23/21 1647 100     Resp 09/23/21 1647 18     Temp 09/23/21 1647 98.2 F (36.8 C)     Temp Source 09/23/21 1647 Oral     SpO2 09/23/21 1647 100 %     Weight 09/23/21 1654 (!) 301 lb (136.5 kg)     Height 09/23/21 1654 5\' 5"  (1.651 m)   Constitutional: Alert and oriented. Well appearing and in no acute distress. Eyes: Conjunctivae are normal.  Head: Atraumatic. Nose: No congestion/rhinnorhea. Mouth/Throat: Mucous membranes are moist.   Neck: No stridor.   Cardiovascular: Normal rate, regular rhythm. Good peripheral circulation. Grossly normal heart sounds.   Respiratory: Normal respiratory effort.  No retractions. Lungs CTAB. Gastrointestinal: Soft and nontender. No distention.  Musculoskeletal: No lower extremity tenderness nor edema. No gross deformities of extremities.  Normal range of motion of the left wrist with slight clicking felt with flexion/extension near the base of the thumb.  No joint swelling or redness.  Neurologic:  Normal speech and language. No gross focal neurologic deficits are appreciated.  Skin:  Skin is warm, dry and intact. No rash noted. Psychiatric: Mood and affect are normal. Speech and behavior are normal.  ____________________________________________   LABS (all labs ordered are listed, but only abnormal results are displayed)  Labs Reviewed  ACETAMINOPHEN LEVEL - Abnormal; Notable  for the following components:      Result Value   Acetaminophen (Tylenol), Serum <10 (*)    All other components within normal limits  COMPREHENSIVE METABOLIC PANEL - Abnormal; Notable for the following components:   Glucose, Bld 106 (*)    Total Bilirubin 0.1 (*)    All other components within normal limits  SALICYLATE LEVEL - Abnormal; Notable for the following components:   Salicylate Lvl [id number] (*)    All other components within normal limits  CBC WITH DIFFERENTIAL/PLATELET - Abnormal; Notable for the following components:   Hemoglobin 10.6 (*)    MCV 71.1 (*)    MCH 20.9 (*)    MCHC 29.4 (*)    RDW 16.3 (*)    All other components within normal limits  ETHANOL  RAPID URINE DRUG SCREEN, HOSP PERFORMED  HCG, QUANTITATIVE, PREGNANCY    ____________________________________________   PROCEDURES  Procedure(s) performed:   Procedures  None ____________________________________________   INITIAL IMPRESSION / ASSESSMENT AND PLAN / ED COURSE  Pertinent labs & imaging results that were available during my care of the patient were reviewed by me and considered in my medical decision making (see chart for details).   This patient is Presenting for Evaluation of SI, which does require a range of treatment options, and is a complaint that involves a high risk of morbidity and mortality.  The Differential Diagnoses include psychosis, medication intolerance, depression.   I decided to review pertinent External Data, and in summary patient last in this ED for mental health evaluation in October 2022 and was ultimately admitted.   Clinical Laboratory Tests Ordered, included beta-hCG which is within normal limits.  No leukocytosis.  UDS negative.  Kidney function normal.  Radiologic Tests Ordered, included wrist x-ray. I independently interpreted the images and agree with radiology interpretation.   Social Determinants of Health Risk patient is not a smoker.   Consult complete  with TTS  Medical Decision Making: Summary:  Patient arrives voluntary for evaluation of suicidal ideation.  Medically she is complaining of some wrist discomfort but exam is not consistent with septic joint or injury.  Plain film independently evaluated and agree with radiology interpretation.  Have restarted home medications and will have TTS follow for safe disposition.   Disposition: discharge  ____________________________________________  FINAL CLINICAL IMPRESSION(S) / ED DIAGNOSES  Final diagnoses:  Suicidal ideation    Note:  This document was prepared using Dragon voice recognition software and may include unintentional dictation errors.  Nanda Quinton, MD, [facility] Emergency Medicine    Zaeden Lastinger, Wonda Old[facility], MD 10/01/21 [phone]

## 2021-09-24 DIAGNOSIS — F251 Schizoaffective disorder, depressive type: Secondary | ICD-10-CM

## 2021-09-24 DIAGNOSIS — R45851 Suicidal ideations: Secondary | ICD-10-CM

## 2021-09-24 MED ORDER — NORETHIN ACE-ETH ESTRAD-FE 1-20 MG-MCG(24) PO TABS
1.0000 | ORAL_TABLET | Freq: Every day | ORAL | Status: DC
  Administered 2021-09-24: 1 via ORAL

## 2021-09-24 MED ORDER — LISINOPRIL 10 MG PO TABS: 10.0000 mg | ORAL_TABLET | Freq: Every day | ORAL | Status: DC

## 2021-09-24 MED ORDER — MELATONIN 3 MG PO TABS: 9.0000 mg | ORAL_TABLET | Freq: Every evening | ORAL | Status: DC | PRN

## 2021-09-24 MED ORDER — DIVALPROEX SODIUM 250 MG PO DR TAB
250.0000 mg | DELAYED_RELEASE_TABLET | Freq: Every day | ORAL | Status: DC
  Administered 2021-09-24: 250 mg via ORAL
  Filled 2021-09-24: qty 1

## 2021-09-24 MED ORDER — NORETHIN ACE-ETH ESTRAD-FE 1-20 MG-MCG(24) PO TABS: 1.0000 | ORAL_TABLET | Freq: Every day | ORAL | Status: DC

## 2021-09-24 MED ORDER — ARIPIPRAZOLE 2 MG PO TABS
2.0000 mg | ORAL_TABLET | Freq: Every day | ORAL | Status: DC
  Filled 2021-09-24: qty 1

## 2021-09-24 MED ORDER — ARIPIPRAZOLE 2 MG PO TABS
2.0000 mg | ORAL_TABLET | Freq: Every day | ORAL | Status: DC
  Administered 2021-09-24: 2 mg via ORAL
  Filled 2021-09-24 (×4): qty 1

## 2021-09-24 MED ORDER — METFORMIN HCL 500 MG PO TABS
500.0000 mg | ORAL_TABLET | Freq: Two times a day (BID) | ORAL | Status: DC
  Administered 2021-09-24 (×2): 500 mg via ORAL
  Filled 2021-09-24 (×2): qty 1

## 2021-09-24 MED ORDER — DIVALPROEX SODIUM 250 MG PO DR TAB: 250.0000 mg | DELAYED_RELEASE_TABLET | Freq: Every day | ORAL | Status: DC

## 2021-09-24 MED ORDER — LISINOPRIL 10 MG PO TABS
10.0000 mg | ORAL_TABLET | Freq: Every day | ORAL | Status: DC
  Administered 2021-09-24: 10 mg via ORAL
  Filled 2021-09-24: qty 1

## 2021-09-24 NOTE — ED Notes (Signed)
Spoke with father who is coming to get patient.  Patient has an appt with therapist this evening.

## 2021-09-24 NOTE — ED Notes (Signed)
TTS in progress 
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
## 2021-09-24 NOTE — ED Provider Notes (Signed)
Patient cleared by behavioral health for discharge home.  Patient has a Publishing rights manager behavioral health, Kenney Houseman May to follow-up with.   Vanetta Mulders, MD 09/24/21 1446
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
## 2021-09-24 NOTE — Discharge Instructions (Signed)
Patient cleared by behavioral health for discharge.  Follow-up with your psychiatrist [facility].  Return for any new or worse symptoms.

## 2021-09-24 NOTE — ED Provider Notes (Signed)
Behavioral health was scheduled to reevaluate in the morning.  They have not done an additional note.  Patient originally brought in for concern for suicidal ideation.  Patient is involuntary.  Got a call from her psychiatric nurse practitioner who is [facility], she is local.  If necessary she can be contacted at [phone].   Vanetta Mulders, MD 09/24/21 [phone]

## 2021-09-24 NOTE — ED Notes (Signed)
Patient reports she only takes metformin in the am and then all others are at night and she is weaning off depakote because she is not bipolar. Pharm and Psych notified.

## 2021-09-24 NOTE — ED Notes (Signed)
Pt having TTS assessment at this time  °

## 2021-09-24 NOTE — ED Notes (Signed)
Patient resting at this time. Mother called to speak to her but will have patient call when she is up.

## 2021-12-18 ENCOUNTER — Ambulatory Visit (INDEPENDENT_AMBULATORY_CARE_PROVIDER_SITE_OTHER): Payer: Medicaid Other | Admitting: Adult Health

## 2021-12-18 ENCOUNTER — Encounter: Payer: Self-pay | Admitting: Adult Health

## 2021-12-18 ENCOUNTER — Other Ambulatory Visit (HOSPITAL_COMMUNITY)
Admission: RE | Admit: 2021-12-18 | Discharge: 2021-12-18 | Disposition: A | Discharge status: Home / Self Care | Payer: Medicaid Other | Source: Ambulatory Visit | Attending: Adult Health | Admitting: Adult Health

## 2021-12-18 VITALS — BP 119/77 | HR 93 | Ht 65.0 in | Wt 288.0 lb

## 2021-12-18 DIAGNOSIS — Z113 Encounter for screening for infections with a predominantly sexual mode of transmission: Secondary | ICD-10-CM | POA: Diagnosis not present

## 2021-12-18 HISTORY — DX: Encounter for screening for infections with a predominantly sexual mode of transmission: Z11.3

## 2021-12-18 NOTE — Progress Notes (Addendum)
?  Subjective:  ?  ? Patient ID: Kathy Rios, female   DOB: October 27, 2000, 21 y.o.   MRN: [id number] ? ?HPI ?Kathy Rios is a 21 year old black female,single, G0P0, in requesting STD testing. ?PCP is [facility] in [place] ? ?Lab Results  ?Component Value Date  ? DIAGPAP  04/29/2021  ?  - Negative for intraepithelial lesion or malignancy (NILM)  ?  ?Review of Systems ?Patient denies any headaches, hearing loss, fatigue, blurred vision, shortness of breath, chest pain, abdominal pain, problems with bowel movements, urination, or intercourse. No joint pain or mood swings.  ?  Denies any discharge, itching or burning ?Reviewed past medical,surgical, social and family history. Reviewed medications and allergies.  ?Objective:  ? Physical Exam ?BP 119/77 (BP Location: Left Arm, Patient Position: Sitting, Cuff Size: Large)   Pulse 93   Ht 5\' 5"  (1.651 m)   Wt 288 lb (130.6 kg)   LMP 11/23/2021   BMI 47.93 kg/m?   ?  Skin warm and dry.Pelvic: external genitalia is normal in appearance no lesions, vagina: white discharge without odor,urethra has no lesions or masses noted, cervix:smooth, uterus: normal size, shape and contour, non tender, no masses felt, adnexa: no masses or tenderness noted. Bladder is non tender and no masses felt.CV swab obtained. ?Fall risk is low ? [facility] - 12/18/21 1155   ? ?  ? Pregnancy Intention Screening  ? Does the patient want to become pregnant in the next year? No   ? Does the patient's partner want to become pregnant in the next year? No   ? Would the patient like to discuss contraceptive options today? No   ?  ? Contraception Wrap Up  ? Current Method Oral Contraceptive   ? End Method Oral Contraceptive   ? ?  ?  ? ?  ? Examination chaperoned by 02/17/22 LPN ? ?Assessment:  ?   ?1. Screening examination for STD (sexually transmitted disease) ?CV swab sent for CG/CHL and trich ?Will check labs  ?- Hepatitis C antibody ?- Hepatitis B surface antigen ?- HIV Antibody (routine testing w  rflx) ?- RPR ?- Cervicovaginal ancillary only( [place])  ?   ?Plan:  ?   ?Return about 05/12/22 for physical  ?   ?

## 2021-12-19 LAB — HIV ANTIBODY (ROUTINE TESTING W REFLEX): HIV Screen 4th Generation wRfx: NONREACTIVE

## 2021-12-19 LAB — HEPATITIS B SURFACE ANTIGEN: Hepatitis B Surface Ag: NEGATIVE

## 2021-12-19 LAB — RPR: RPR Ser Ql: NONREACTIVE

## 2021-12-19 LAB — HEPATITIS C ANTIBODY: Hep C Virus Ab: NONREACTIVE

## 2021-12-20 LAB — CERVICOVAGINAL ANCILLARY ONLY
Chlamydia: NEGATIVE
Comment: NEGATIVE
Comment: NEGATIVE
Comment: NORMAL
Neisseria Gonorrhea: NEGATIVE
Trichomonas: NEGATIVE

## 2022-05-12 ENCOUNTER — Other Ambulatory Visit (HOSPITAL_COMMUNITY)
Admission: RE | Admit: 2022-05-12 | Discharge: 2022-05-12 | Disposition: A | Discharge status: Home / Self Care | Payer: Medicaid Other | Source: Ambulatory Visit | Attending: Adult Health | Admitting: Adult Health

## 2022-05-12 ENCOUNTER — Ambulatory Visit (INDEPENDENT_AMBULATORY_CARE_PROVIDER_SITE_OTHER): Payer: Medicaid Other | Admitting: Adult Health

## 2022-05-12 ENCOUNTER — Encounter: Payer: Self-pay | Admitting: Adult Health

## 2022-05-12 VITALS — BP 122/76 | HR 88 | Ht 65.0 in | Wt 279.5 lb

## 2022-05-12 DIAGNOSIS — Z113 Encounter for screening for infections with a predominantly sexual mode of transmission: Secondary | ICD-10-CM | POA: Diagnosis not present

## 2022-05-12 DIAGNOSIS — Z0000 Encounter for general adult medical examination without abnormal findings: Secondary | ICD-10-CM

## 2022-05-12 DIAGNOSIS — Z01419 Encounter for gynecological examination (general) (routine) without abnormal findings: Secondary | ICD-10-CM

## 2022-05-12 DIAGNOSIS — Z3041 Encounter for surveillance of contraceptive pills: Secondary | ICD-10-CM

## 2022-05-12 MED ORDER — JUNEL FE 24 1-20 MG-MCG(24) PO TABS: 1.0000 | ORAL_TABLET | Freq: Every day | ORAL | 11 refills | Status: DC

## 2022-05-12 NOTE — Progress Notes (Signed)
Patient ID: Kathy Rios, female   DOB: [birth year], 21 y.o.   MRN: [id number] History of Present Illness: Kathy Rios is a 21 year old black female,single, G0P0, in for a well woman gyn exam.  Last pap was 04/29/21, negative for malignancy.   PCP is Kathy Caroli NP.  Current Medications, Allergies, Past Medical History, Past Surgical History, Family History and Social History were reviewed in [name] record.     Review of Systems: Patient denies any headaches, hearing loss, fatigue, blurred vision, shortness of breath, chest pain, abdominal pain, problems with bowel movements, urination, or intercourse. No joint pain or mood swings.  Periods are good.   Physical Exam:BP 122/76 (BP Location: Left Arm, Patient Position: Sitting, Cuff Size: Large)   Pulse 88   Ht 5\' 5"  (1.651 m)   Wt 279 lb 8 oz (126.8 kg)   LMP 05/10/2022   BMI 46.51 kg/m   General:  Well developed, well nourished, no acute distress Skin:  Warm and dry Neck:  Midline trachea, normal thyroid, good ROM, no lymphadenopathy Lungs; Clear to auscultation bilaterally Breast:  No dominant palpable mass, retraction, or nipple discharge Cardiovascular: Regular rate and rhythm Abdomen:  Soft, non tender, no hepatosplenomegaly Pelvic:  External genitalia is normal in appearance, no lesions.  The vagina is normal in appearance,+period blood. Urethra has no lesions or masses. The cervix is smooth..  Uterus is felt to be normal size, shape, and contour.  No adnexal masses or tenderness noted.Bladder is non tender, no masses felt. Extremities/musculoskeletal:  No swelling or varicosities noted, no clubbing or cyanosis Psych:  No mood changes, alert and cooperative,seems happy AA is 2 Fall ris is low    05/12/2022   11:08 AM 04/29/2021   11:38 AM 03/28/2021   12:15 PM  Depression screen PHQ 2/9  Decreased Interest 1 3 3   Down, Depressed, Hopeless 1 3 2   PHQ - 2 Score 2 6 5   Altered sleeping 1 2 3   Tired,  decreased energy 1 3 2   Change in appetite 1 2 1   Feeling bad or failure about yourself  1 1 1   Trouble concentrating 3 3 3   Moving slowly or fidgety/restless 2 2 2   Suicidal thoughts 1 1 1   PHQ-9 Score 12 20 18   Difficult doing work/chores   Somewhat difficult   She is on meds and sees [facility] in [place], by telehealth    05/12/2022   11:08 AM 04/29/2021   11:38 AM 03/28/2021   12:18 PM  GAD 7 : Generalized Anxiety Score  Nervous, Anxious, on Edge 1 3 2   Control/stop worrying 1 3 2   Worry too much - different things 2 3 2   Trouble relaxing 1 2 3   Restless 1 2 1   Easily annoyed or irritable 2 2 2   Afraid - awful might happen 1 2 2   Total GAD 7 Score 9 17 14   Anxiety Difficulty   Very difficult      [facility] - 05/12/22 1117       Pregnancy Intention Screening   Does the patient want to become pregnant in the next year? No    Does the patient's partner want to become pregnant in the next year? No    Would the patient like to discuss contraceptive options today? No      Contraception Wrap Up   Current Method Oral Contraceptive    End Method Oral Contraceptive             Examination  chaperoned by Levy Pupa LPN  Impression and Plan: 1. Encounter for well woman exam with routine gynecological exam Physical in 1 year Pap in 2025  2. Encounter for surveillance of contraceptive pills Periods good, happy with OCs Will continue Junel 1/20 Meds ordered this encounter  Medications   Norethindrone Acetate-Ethinyl Estrad-FE (JUNEL FE 24) 1-20 MG-MCG(24) tablet    Sig: Take 1 tablet by mouth daily.    Dispense:  28 tablet    Refill:  11    Order Specific Question:   Supervising Provider    Answer:   Elonda Husky, LUTHER H [[id number]]     3. Screening examination for STD (sexually transmitted disease) CV swab sent for GC/Chl and trich

## 2022-05-12 NOTE — Progress Notes (Signed)
No show

## 2022-05-13 LAB — CERVICOVAGINAL ANCILLARY ONLY
Chlamydia: NEGATIVE
Comment: NEGATIVE
Comment: NEGATIVE
Comment: NORMAL
Neisseria Gonorrhea: NEGATIVE
Trichomonas: NEGATIVE

## 2022-06-23 ENCOUNTER — Ambulatory Visit: Payer: Medicaid Other | Admitting: Nurse Practitioner

## 2022-06-27 ENCOUNTER — Encounter: Payer: Self-pay | Admitting: Family Medicine

## 2022-06-27 ENCOUNTER — Ambulatory Visit (INDEPENDENT_AMBULATORY_CARE_PROVIDER_SITE_OTHER): Payer: Medicaid Other | Admitting: Family Medicine

## 2022-06-27 VITALS — BP 126/72 | HR 108 | Ht 65.0 in | Wt 271.0 lb

## 2022-06-27 DIAGNOSIS — E7849 Other hyperlipidemia: Secondary | ICD-10-CM

## 2022-06-27 DIAGNOSIS — E559 Vitamin D deficiency, unspecified: Secondary | ICD-10-CM | POA: Diagnosis not present

## 2022-06-27 DIAGNOSIS — G4700 Insomnia, unspecified: Secondary | ICD-10-CM

## 2022-06-27 DIAGNOSIS — R7303 Prediabetes: Secondary | ICD-10-CM

## 2022-06-27 DIAGNOSIS — R7301 Impaired fasting glucose: Secondary | ICD-10-CM

## 2022-06-27 DIAGNOSIS — Z23 Encounter for immunization: Secondary | ICD-10-CM | POA: Diagnosis not present

## 2022-06-27 DIAGNOSIS — F900 Attention-deficit hyperactivity disorder, predominantly inattentive type: Secondary | ICD-10-CM

## 2022-06-27 DIAGNOSIS — I10 Essential (primary) hypertension: Secondary | ICD-10-CM

## 2022-06-27 DIAGNOSIS — F411 Generalized anxiety disorder: Secondary | ICD-10-CM

## 2022-06-27 DIAGNOSIS — Z114 Encounter for screening for human immunodeficiency virus [HIV]: Secondary | ICD-10-CM | POA: Diagnosis not present

## 2022-06-27 DIAGNOSIS — F3289 Other specified depressive episodes: Secondary | ICD-10-CM

## 2022-06-27 DIAGNOSIS — Z1159 Encounter for screening for other viral diseases: Secondary | ICD-10-CM | POA: Diagnosis not present

## 2022-06-27 DIAGNOSIS — E038 Other specified hypothyroidism: Secondary | ICD-10-CM

## 2022-06-27 HISTORY — DX: Insomnia, unspecified: G47.00

## 2022-06-27 NOTE — Progress Notes (Signed)
New Patient Office Visit  Subjective:  Patient ID: Kathy Rios, female    DOB: 2001/03/08  Age: 21 y.o. MRN: [id number]  CC:  Chief Complaint  Patient presents with   Establish Care    New patient, establish care, want to discuss prediabetic management, also wants a referral to a new psych.     HPI Kathy Rios is a 21 y.o. female with past medical history of GAD, PTSD, schizoaffective disorder, Depression, ADHD, hypertension, obesity and prediabetes presents for establishing care.  Hypertension: lisinopril 10 mg daily and clonidiine 0.1 mg daily at bedtime. She denies headaches, dizziness, blurred vision, chest pain, palpitation. She reports compliance with treatment regimen.  schizoaffective disorder: She takes Abilify 5 mg daily and Prozac 20 mg daily.  She reports compliance with treatment regiment and denies suicidal ideation and homicidal ideation.  She reports following up [facility] in [place] virtually and in person every 6 months.  ADHD: She takes Vyvanse 40 mg daily.  She reports compliance with treatment regimen.  She denies dizziness, irritability, decreased appetite, insomnia, and dry mouth.  Generalized anxiety disorder: She takes Prozac 20 mg daily.  She denies worsening of her depression and anxiety.  She denies suicidal ideation and homicidal ideation.  PTSD: She takes prazosin 1 milligram capsule daily at bedtime to help with nightmares.  She reports minimal relief of her symptoms with the treatment regimen.  Insomnia: She takes clonidine 0.1 mg at bedtime, and prazosin 70m At Bedtime.  Prediabetes: She takes metformin 500 mg twice daily.  She denies polyuria, polyphagia, polydipsia.  She reports compliance with treatment regimen.  Obesity: She reports the highest weight that she has been is 316 pounds, her weight today is 271 pounds; noting that she works out at least 3 days a week and has a mHigher education careers adviserat [employer]   She  received her Tdap and HPV vaccine today.    Past Medical History:  Diagnosis Date   ADD (attention deficit disorder)    ADHD (attention deficit hyperactivity disorder)    Anemia    Anxiety    Depression    Diabetes mellitus    Diabetes mellitus, type II ([facility]    Encounter for menstrual regulation 01/25/2016   Hypertension    Irregular periods 10/25/2015   Obesity    PMS (premenstrual syndrome) 11/05/2015   PTSD (post-traumatic stress disorder)    Reactive hypoglycemia    Schizoaffective disorder, depressive type ([facility]    Severe menstrual cramps 11/05/2015   Thalassemia trait    Thalassemia trait, alpha     Past Surgical History:  Procedure Laterality Date   ADENOIDECTOMY     TONSILLECTOMY     WISDOM TOOTH EXTRACTION  07/2016    Family History  Problem Relation Age of Onset   Seizures Mother    Arthritis Mother    Diabetes Mother    Hyperlipidemia Mother    Depression Mother    Hyperlipidemia Father    Hypertension Father    Gout Father    Dementia Father    Hypertension Sister    Mental illness Brother    Hyperlipidemia Brother    ADD / ADHD Brother    Depression Brother    Hypertension Maternal Grandmother    Cancer Maternal Grandmother    Hypertension Maternal Grandfather    Thyroid disease Maternal Grandfather    Cancer Maternal Grandfather    Prostate cancer Maternal Grandfather    Anemia Paternal Grandmother    Cancer Paternal Grandmother  cervical   Hypertension Paternal Grandfather    Heart disease Paternal Grandfather    Hypertension Other    Other Other        heart skips-maternal great grandma   Other Other        fibroids- maternal grandma and great grandma   Heart disease Other    Schizophrenia Maternal Aunt    Bipolar disorder Maternal Aunt    Alcohol abuse Maternal Uncle     Social History   Socioeconomic History   Marital status: Single    Spouse name: Not on file   Number of children: Not on file   Years of education:  Not on file   Highest education level: Not on file  Occupational History   Not on file  Tobacco Use   Smoking status: Never   Smokeless tobacco: Never  Vaping Use   Vaping Use: Never used  Substance and Sexual Activity   Alcohol use: Yes    Comment: "sometimes"   Drug use: No   Sexual activity: Yes    Birth control/protection: Pill  Other Topics Concern   Not on file  Social History Narrative   Not on file   Social Determinants of Health   Financial Resource Strain: Medium Risk (05/12/2022)   Overall Financial Resource Strain (CARDIA)    Difficulty of Paying Living Expenses: Somewhat hard  Food Insecurity: No Food Insecurity (05/12/2022)   Hunger Vital Sign    Worried About Running Out of Food in the Last Year: Never true    Ran Out of Food in the Last Year: Never true  Transportation Needs: No Transportation Needs (05/12/2022)   PRAPARE - Hydrologist (Medical): No    Lack of Transportation (Non-Medical): No  Physical Activity: Sufficiently Active (05/12/2022)   Exercise Vital Sign    Days of Exercise per Week: 3 days    Minutes of Exercise per Session: 60 min  Stress: No Stress Concern Present (05/12/2022)   [place]    Feeling of Stress : Only a little  Social Connections: Moderately Isolated (05/12/2022)   Social Connection and Isolation Panel [NHANES]    Frequency of Communication with Friends and Family: Once a week    Frequency of Social Gatherings with Friends and Family: Once a week    Attends Religious Services: 1 to 4 times per year    Active Member of [employer] or Organizations: Yes    Attends Archivist Meetings: 1 to 4 times per year    Marital Status: Never married  Intimate Partner Violence: Not At Risk (05/12/2022)   Humiliation, Afraid, Rape, and Kick questionnaire    Fear of Current or Ex-Partner: No    Emotionally Abused: No    Physically Abused: No     Sexually Abused: No    ROS Review of Systems  Constitutional:  Negative for fatigue and fever.  HENT:  Negative for rhinorrhea, sinus pressure and sinus pain.   Eyes:  Negative for visual disturbance.  Respiratory:  Negative for chest tightness and shortness of breath.   Cardiovascular:  Negative for chest pain and palpitations.  Gastrointestinal:  Negative for constipation, nausea and vomiting.  Endocrine: Negative for polydipsia, polyphagia and polyuria.  Genitourinary:  Negative for dysuria, flank pain and urgency.  Musculoskeletal:  Negative for back pain and neck pain.  Skin:  Negative for rash.  Neurological:  Negative for dizziness and headaches.  Psychiatric/Behavioral:  Negative for  behavioral problems, self-injury and suicidal ideas.     Objective:   Today's Vitals: BP 126/72 (BP Location: Left Arm)   Pulse (!) 108   Ht 5' 5" (1.651 m)   Wt 271 lb 0.6 oz (122.9 kg)   SpO2 96%   BMI 45.10 kg/m   Physical Exam HENT:     Head: Normocephalic.     Right Ear: External ear normal.     Left Ear: External ear normal.     Nose: No congestion.     Mouth/Throat:     Mouth: Mucous membranes are moist.  Eyes:     Extraocular Movements: Extraocular movements intact.     Pupils: Pupils are equal, round, and reactive to light.  Cardiovascular:     Rate and Rhythm: Normal rate and regular rhythm.     Heart sounds: No murmur heard. Pulmonary:     Breath sounds: Normal breath sounds. No rhonchi.  Abdominal:     Tenderness: There is no right CVA tenderness or left CVA tenderness.  Musculoskeletal:     Cervical back: No rigidity.     Right lower leg: No edema.     Left lower leg: No edema.  Skin:    Findings: No bruising.  Neurological:     Mental Status: She is alert and oriented to person, place, and time.     Assessment & Plan:   Problem List Items Addressed This Visit       Cardiovascular and Mediastinum   Essential hypertension    Controlled Encouraged to  continue taking  lisinopril 10 mg daily and clonidiine 0.1 mg daily at bedtime Will assess CBC and CMP today BP Readings from Last 3 Encounters:  06/27/22 126/72  05/12/22 122/76  12/18/21 119/77         Relevant Medications   prazosin (MINIPRESS) 1 MG capsule   Other Relevant Orders   CMP14+EGFR   CBC with Differential/Platelet     Other   Attention deficit hyperactivity disorder (ADHD), predominantly inattentive type    Encouraged to continue taking Vyvanse 40 mg daily A referral was placed to [facility] in [place], as the patient will like a psychiatrist in [place]      Relevant Orders   Ambulatory referral to Psychiatry   Prediabetes    She takes metformin 500 mg twice daily She denies polyuria, polyphagia, polydipsia She reports compliance with treatment regimen Will assess hemoglobin A1c today      Relevant Orders   Hemoglobin A1c   Depression    Encouraged to continue taking Prozac 20 mg daily She denies suicidal thoughts and ideation A referral was placed to [facility] in [place], as the patient will like a psychiatrist in [place]      Relevant Orders   Ambulatory referral to Psychiatry   Morbid obesity ([place])    She reports the highest weight that she has been is 316 pounds, her weight today is 271 pounds; noting that she works out at least 3 days a week and has a Higher education careers adviser at [employer] a heart healthy diet and increase physical activities       Insomnia    She takes clonidine 0.1 mg at bedtime and prazosin 56m At bedtime No changes to the treatment regimen Encouraged to continue taking her current treatment regimen and  to follow up with her psychiatrist as needed for dose titration         Other Visit Diagnoses     Immunization due    -  Primary   Relevant Orders   Tdap vaccine greater than or equal to 7yo IM (Completed)   HPV 9-valent vaccine,Recombinat (Completed)   IFG (impaired fasting glucose)        Vitamin D deficiency       Relevant Orders   VITAMIN D 25 Hydroxy (Vit-D Deficiency, Fractures)   Need for hepatitis C screening test       Relevant Orders   Hepatitis C antibody   Encounter for screening for HIV       Relevant Orders   HIV Antibody (routine testing w rflx)   Other specified hypothyroidism       Relevant Orders   TSH + free T4   Other hyperlipidemia       Relevant Medications   prazosin (MINIPRESS) 1 MG capsule   Other Relevant Orders   Lipid panel   GAD (generalized anxiety disorder)       Relevant Orders   Ambulatory referral to Psychiatry       Outpatient Encounter Medications as of 06/27/2022  Medication Sig   ACCU-CHEK GUIDE test strip USE TO CHECK BLOOD SUGAR DAILY   Accu-Chek Softclix Lancets lancets daily.   acetaminophen (TYLENOL) 500 MG tablet Take 1,000 mg by mouth every 6 (six) hours as needed for mild pain or headache.   ARIPiprazole (ABILIFY) 5 MG tablet Take 5 mg by mouth daily.   b complex vitamins capsule Take 1 capsule by mouth.   cloNIDine HCl (KAPVAY) 0.1 MG TB12 ER tablet Take 0.1 mg by mouth at bedtime.   FLUoxetine (PROZAC) 20 MG capsule Take 20 mg by mouth daily.   lisdexamfetamine (VYVANSE) 40 MG capsule Take 40 mg by mouth every morning.   lisinopril (ZESTRIL) 10 MG tablet Take 1 tablet (10 mg total) by mouth daily.   metFORMIN (GLUCOPHAGE) 500 MG tablet Take 500 mg by mouth 2 (two) times daily with a meal.   Multiple Vitamin (MULTIVITAMIN WITH MINERALS) TABS tablet Take 1 tablet by mouth daily.   Norethindrone Acetate-Ethinyl Estrad-FE (JUNEL FE 24) 1-20 MG-MCG(24) tablet Take 1 tablet by mouth daily.   prazosin (MINIPRESS) 1 MG capsule Take 1 mg by mouth at bedtime.   UNABLE TO FIND Omega 3-takes 1 tab daily   valACYclovir (VALTREX) 1000 MG tablet Take 2 tablets by mouth 2 (two) times daily as needed (cold sores).    [DISCONTINUED] Melatonin 5 MG TABS Take 2 tablets by mouth at bedtime as needed (sleep).   No  facility-administered encounter medications on file as of 06/27/2022.    Follow-up: Return in about 3 months (around 09/27/2022) for CPE.   Alvira Monday, FNP

## 2022-06-27 NOTE — Assessment & Plan Note (Signed)
She takes clonidine 0.1 mg at bedtime and prazosin 1mg  At bedtime No changes to the treatment regimen Encouraged to continue taking her current treatment regimen and  to follow up with her psychiatrist as needed for dose titration

## 2022-06-27 NOTE — Assessment & Plan Note (Addendum)
Encouraged to continue taking Vyvanse 40 mg daily A referral was placed to [facility] in [place], as the patient will like a psychiatrist in [place]

## 2022-06-27 NOTE — Assessment & Plan Note (Addendum)
Controlled Encouraged to continue taking  lisinopril 10 mg daily and clonidiine 0.1 mg daily at bedtime Will assess CBC and CMP today BP Readings from Last 3 Encounters:  06/27/22 126/72  05/12/22 122/76  12/18/21 119/77

## 2022-06-27 NOTE — Assessment & Plan Note (Signed)
She takes metformin 500 mg twice daily She denies polyuria, polyphagia, polydipsia She reports compliance with treatment regimen Will assess hemoglobin A1c today

## 2022-06-27 NOTE — Patient Instructions (Addendum)
I appreciate the opportunity to provide care to you today!    Follow up:  3 months CPE  Labs: please stop by the lab during the week to get your blood drawn (CBC, CMP, TSH, Lipid profile, HgA1c, Vit D)  Screening: HIV and Hep C  Referral: Psychiatry    Please continue to a heart-healthy diet and increase your physical activities. Try to exercise for at least three times a week.      It was a pleasure to see you and I look forward to continuing to work together on your health and well-being. Please do not hesitate to call the office if you need care or have questions about your care.   Have a wonderful day and week. With Gratitude, Gilmore Laroche MSN, FNP-BC

## 2022-06-27 NOTE — Assessment & Plan Note (Signed)
She reports the highest weight that she has been is 316 pounds, her weight today is 271 pounds; noting that she works out at least 3 days a week and has a Research scientist (physical sciences) at [employer] a heart healthy diet and increase physical activities

## 2022-06-27 NOTE — Assessment & Plan Note (Signed)
Encouraged to continue taking Prozac 20 mg daily She denies suicidal thoughts and ideation A referral was placed to [facility] in [place], as the patient will like a psychiatrist in [place]

## 2022-07-01 LAB — CBC WITH DIFFERENTIAL/PLATELET
Basophils Absolute: 0 10*3/uL (ref 0.0–0.2)
Basos: 1 %
EOS (ABSOLUTE): 0.1 10*3/uL (ref 0.0–0.4)
Eos: 2 %
Hematocrit: 34.6 % (ref 34.0–46.6)
Hemoglobin: 10.8 g/dL — ABNORMAL LOW (ref 11.1–15.9)
Immature Grans (Abs): 0 10*3/uL (ref 0.0–0.1)
Immature Granulocytes: 0 %
Lymphocytes Absolute: 2.5 10*3/uL (ref 0.7–3.1)
Lymphs: 41 %
MCH: 21.1 pg — ABNORMAL LOW (ref 26.6–33.0)
MCHC: 31.2 g/dL — ABNORMAL LOW (ref 31.5–35.7)
MCV: 68 fL — ABNORMAL LOW (ref 79–97)
Monocytes Absolute: 0.5 10*3/uL (ref 0.1–0.9)
Monocytes: 8 %
Neutrophils Absolute: 2.9 10*3/uL (ref 1.4–7.0)
Neutrophils: 48 %
Platelets: 403 10*3/uL (ref 150–450)
RBC: 5.11 x10E6/uL (ref 3.77–5.28)
RDW: 16.4 % — ABNORMAL HIGH (ref 11.7–15.4)
WBC: 6.1 10*3/uL (ref 3.4–10.8)

## 2022-07-01 LAB — CMP14+EGFR
ALT: 18 IU/L (ref 0–32)
AST: 12 IU/L (ref 0–40)
Albumin/Globulin Ratio: 1.2 (ref 1.2–2.2)
Albumin: 4.1 g/dL (ref 4.0–5.0)
Alkaline Phosphatase: 117 IU/L (ref 44–121)
BUN/Creatinine Ratio: 16 (ref 9–23)
BUN: 11 mg/dL (ref 6–20)
Bilirubin Total: <0.2 mg/dL (ref 0.0–1.2)
CO2: 21 mmol/L (ref 20–29)
Calcium: 9.1 mg/dL (ref 8.7–10.2)
Chloride: 104 mmol/L (ref 96–106)
Creatinine, Ser: 0.7 mg/dL (ref 0.57–1.00)
Globulin, Total: 3.4 g/dL (ref 1.5–4.5)
Glucose: 98 mg/dL (ref 70–99)
Potassium: 5 mmol/L (ref 3.5–5.2)
Sodium: 136 mmol/L (ref 134–144)
Total Protein: 7.5 g/dL (ref 6.0–8.5)
eGFR: 125 mL/min/{1.73_m2} (ref >59)

## 2022-07-01 LAB — LIPID PANEL
Chol/HDL Ratio: 2.8 ratio (ref 0.0–4.4)
Cholesterol, Total: 202 mg/dL — ABNORMAL HIGH (ref 100–199)
HDL: 71 mg/dL (ref >39)
LDL Chol Calc (NIH): 118 mg/dL — ABNORMAL HIGH (ref 0–99)
Triglycerides: 75 mg/dL (ref 0–149)
VLDL Cholesterol Cal: 13 mg/dL (ref 5–40)

## 2022-07-01 LAB — HEPATITIS C ANTIBODY: Hep C Virus Ab: NONREACTIVE

## 2022-07-01 LAB — TSH+FREE T4
Free T4: 1.09 ng/dL (ref 0.82–1.77)
TSH: 2.02 u[IU]/mL (ref 0.450–4.500)

## 2022-07-01 LAB — HIV ANTIBODY (ROUTINE TESTING W REFLEX): HIV Screen 4th Generation wRfx: NONREACTIVE

## 2022-07-01 LAB — VITAMIN D 25 HYDROXY (VIT D DEFICIENCY, FRACTURES): Vit D, 25-Hydroxy: 34.6 ng/mL (ref 30.0–100.0)

## 2022-07-01 LAB — HEMOGLOBIN A1C
Est. average glucose Bld gHb Est-mCnc: 120 mg/dL
Hgb A1c MFr Bld: 5.8 % — ABNORMAL HIGH (ref 4.8–5.6)

## 2022-07-03 ENCOUNTER — Ambulatory Visit (INDEPENDENT_AMBULATORY_CARE_PROVIDER_SITE_OTHER): Payer: No Typology Code available for payment source | Admitting: Psychiatry

## 2022-07-03 ENCOUNTER — Encounter (HOSPITAL_COMMUNITY): Payer: Self-pay | Admitting: Psychiatry

## 2022-07-03 DIAGNOSIS — F209 Schizophrenia, unspecified: Secondary | ICD-10-CM

## 2022-07-03 DIAGNOSIS — F900 Attention-deficit hyperactivity disorder, predominantly inattentive type: Secondary | ICD-10-CM | POA: Diagnosis not present

## 2022-07-03 DIAGNOSIS — F5104 Psychophysiologic insomnia: Secondary | ICD-10-CM

## 2022-07-03 DIAGNOSIS — F411 Generalized anxiety disorder: Secondary | ICD-10-CM

## 2022-07-03 DIAGNOSIS — Z79899 Other long term (current) drug therapy: Secondary | ICD-10-CM

## 2022-07-03 DIAGNOSIS — F2089 Other schizophrenia: Secondary | ICD-10-CM

## 2022-07-03 DIAGNOSIS — F4310 Post-traumatic stress disorder, unspecified: Secondary | ICD-10-CM | POA: Diagnosis not present

## 2022-07-03 DIAGNOSIS — F250 Schizoaffective disorder, bipolar type: Secondary | ICD-10-CM | POA: Insufficient documentation

## 2022-07-03 HISTORY — DX: Generalized anxiety disorder: F41.1

## 2022-07-03 NOTE — Patient Instructions (Addendum)
We discontinued your vyvanse today due to worsening of your hallucinations. If you wish to get neuropsych testing to definitively make the diagnosis of ADHD (which we are both suspicious you do not have) you could reach out to the following clinics: Eula Flax, NP at [facility] [phone]) or [facility] [phone]) or Ronney Asters at [facility] [phone] has multiple providers. [facility] [phone] has several providers specializing in ADHD/Bipolar= Andrena Mews, PhD is expert at Adult ADHD, Verna Czech, PhD treats adults with both diagnoses.   We otherwise kept your medications the same. I left a message on your therapist's voicemail, if you can let her know we are trying to coordinate care that would help. I have messaged your pcp about getting an updated ecg.
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
## 2022-07-04 ENCOUNTER — Telehealth (HOSPITAL_COMMUNITY): Payer: Self-pay | Admitting: *Deleted

## 2022-07-04 NOTE — Telephone Encounter (Signed)
Patient called and [facility] stating she would like to schedule an appt with provider. Staff called patient and [facility] informing her to call office back to sch appt with provider.

## 2022-07-06 ENCOUNTER — Encounter: Payer: Self-pay | Admitting: Family Medicine

## 2022-07-07 ENCOUNTER — Encounter (HOSPITAL_COMMUNITY): Payer: Self-pay

## 2022-07-07 ENCOUNTER — Other Ambulatory Visit: Payer: Self-pay | Admitting: Family Medicine

## 2022-07-07 ENCOUNTER — Ambulatory Visit: Payer: Medicaid Other | Admitting: Family Medicine

## 2022-07-07 DIAGNOSIS — R442 Other hallucinations: Secondary | ICD-10-CM

## 2022-07-09 ENCOUNTER — Ambulatory Visit (INDEPENDENT_AMBULATORY_CARE_PROVIDER_SITE_OTHER): Payer: Medicaid Other | Admitting: Internal Medicine

## 2022-07-09 ENCOUNTER — Telehealth (HOSPITAL_COMMUNITY): Payer: Self-pay | Admitting: *Deleted

## 2022-07-09 ENCOUNTER — Encounter: Payer: Self-pay | Admitting: Internal Medicine

## 2022-07-09 VITALS — BP 130/84 | HR 96 | Resp 16 | Ht 65.0 in | Wt 280.4 lb

## 2022-07-09 DIAGNOSIS — R000 Tachycardia, unspecified: Secondary | ICD-10-CM

## 2022-07-09 DIAGNOSIS — Z23 Encounter for immunization: Secondary | ICD-10-CM | POA: Diagnosis not present

## 2022-07-09 DIAGNOSIS — Z79899 Other long term (current) drug therapy: Secondary | ICD-10-CM | POA: Diagnosis not present

## 2022-07-09 NOTE — Telephone Encounter (Signed)
Patient called and [facility] stating that provider gived her a list of providers to call to get tested for ADHD. Per pt she called all of those providers and they had openings that were far into next year. Per pt she would like to see if provider had any other place to ref her to that's taking patients a little sooner. Patient number is                [phone]

## 2022-07-09 NOTE — Patient Instructions (Addendum)
Thank you for trusting me with your care. To recap, today we discussed the following:   I will copy Dr.Stinson on results of EKG  You received Flu vaccine today. You received TDAP at last visit. You are due for HPV vaccine on 12/8

## 2022-07-09 NOTE — Progress Notes (Signed)
     CC: Follow-up (States her psychiatrist Dr Adrian Blackwater is requesting an updated EKG on her )    HPI:Kathy Rios is a 21 y.o. female who presents for EKG. For the details of today's visit, please refer to the assessment and plan.   Past Medical History:  Diagnosis Date   ADD (attention deficit disorder)    ADHD (attention deficit hyperactivity disorder)    Anemia    Anxiety    Depression    Diabetes mellitus    Diabetes mellitus, type II (HCC)    Encounter for menstrual regulation 01/25/2016   Hypertension    Irregular periods 10/25/2015   Obesity    PMS (premenstrual syndrome) 11/05/2015   PTSD (post-traumatic stress disorder)    Reactive hypoglycemia    Schizoaffective disorder, depressive type (HCC)    Severe menstrual cramps 11/05/2015   Thalassemia trait    Thalassemia trait, alpha      Physical Exam: Vitals:   07/09/22 1424 07/09/22 1429  BP: (!) 157/85 130/84  Pulse: 96   Resp: 16   SpO2: 96%   Weight: 280 lb 6.4 oz (127.2 kg)   Height: 5\' 5"  (1.651 m)      Physical Exam Constitutional:      General: She is not in acute distress.    Appearance: She is obese.  Cardiovascular:     Rate and Rhythm: Regular rhythm. Tachycardia present.     Comments: Borderline tachycardic Pulmonary:     Effort: Pulmonary effort is normal.     Breath sounds: Normal breath sounds.  Psychiatric:        Behavior: Behavior normal.        Thought Content: Thought content normal.      Assessment & Plan:   Long term current use of antipsychotic medication Patient is being followed by Dr.Stinson remotely. Patient was asked to come to our office for EKG.  - EKG 12-Lead   Need for immunization against influenza  - Flu Vaccine QUAD 70mo+IM (Fluarix, Fluzone & Alfiuria Quad PF)     5mo, MD

## 2022-07-09 NOTE — Assessment & Plan Note (Addendum)
Patient is being followed by Dr.Stinson remotely. Patient was asked to come to our office for EKG.  - EKG 12-Lead  Addendum: NSR, Qtc 420

## 2022-07-11 DIAGNOSIS — Z23 Encounter for immunization: Secondary | ICD-10-CM | POA: Insufficient documentation

## 2022-07-11 NOTE — Assessment & Plan Note (Signed)
°-   Flu Vaccine QUAD 6mo+IM (Fluarix, Fluzone & Alfiuria Quad PF) ° ° ° °

## 2022-07-14 NOTE — Telephone Encounter (Signed)
Noted,   Patient aware

## 2022-07-15 ENCOUNTER — Ambulatory Visit: Payer: Medicaid Other | Admitting: Family Medicine

## 2022-07-16 IMAGING — CT CT RENAL STONE PROTOCOL
2 of 4 series · 16 of 46 positions shown, 18 images · non-contrast
Comparison: None.

CLINICAL DATA: Hematuria.

EXAM:
CT ABDOMEN AND PELVIS WITHOUT CONTRAST
TECHNIQUE: Multidetector CT imaging of the abdomen and pelvis was performed
following the standard protocol without IV contrast.

[Series 2: axial st · axial · 0.96mm/px · z∈[-740,-375]mm · 13 of 83 slices shown, 15 images]
[im 5/83  soft-tissue]
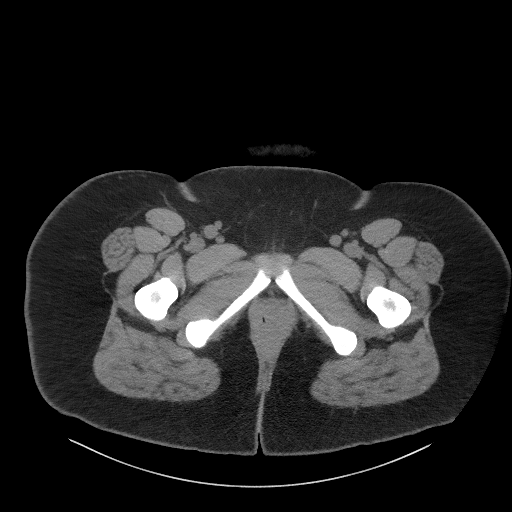
[im 5/83  bone]
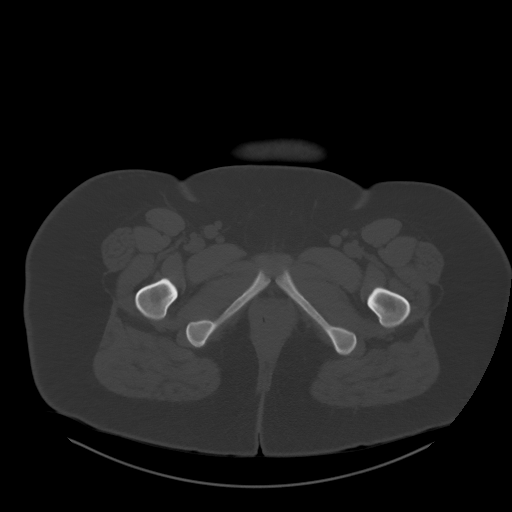
[im 10/83  soft-tissue]
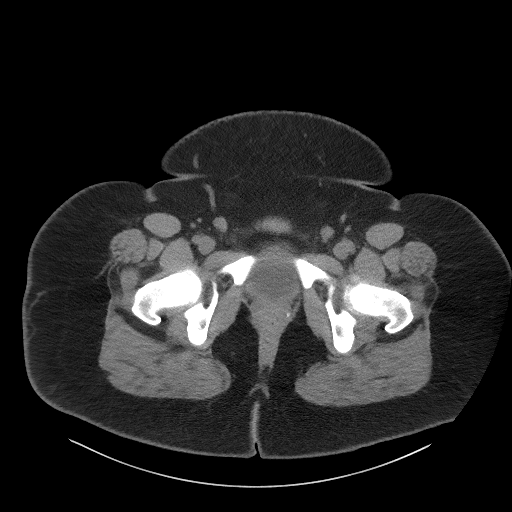
[im 20/83  soft-tissue]
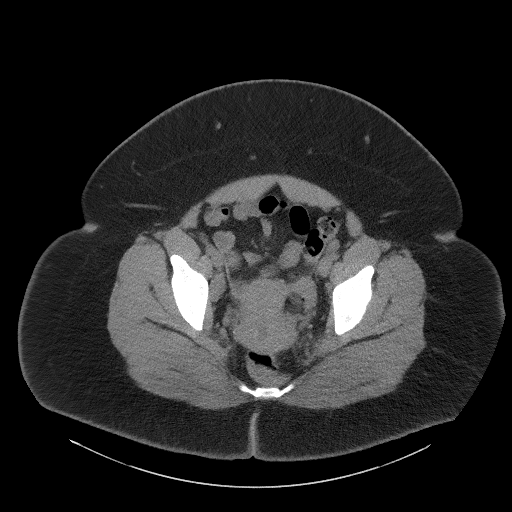
[im 25/83  soft-tissue]
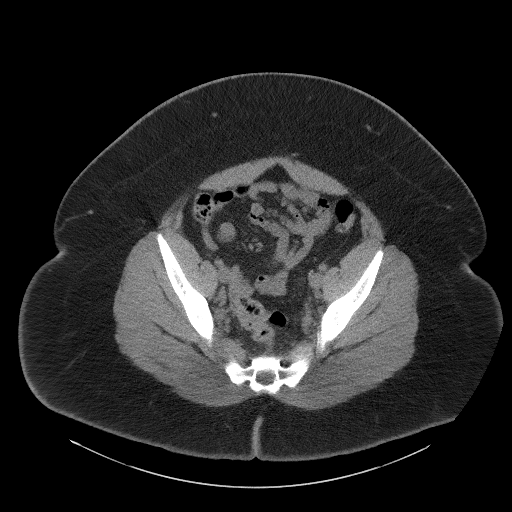
[im 29/83  soft-tissue]
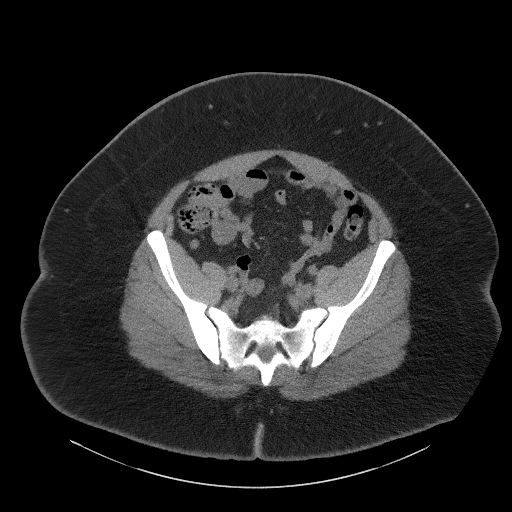
[im 34/83  soft-tissue]
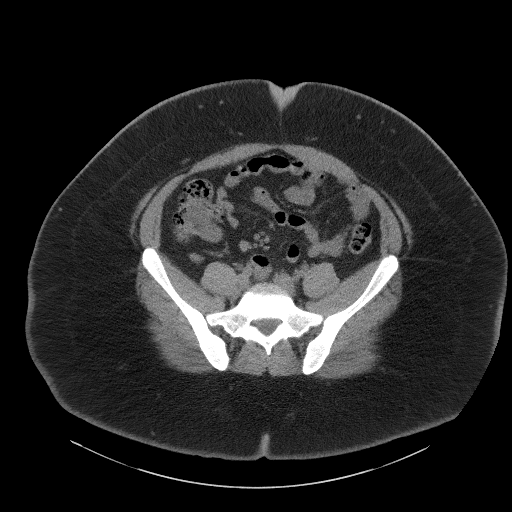
[im 44/83  soft-tissue]
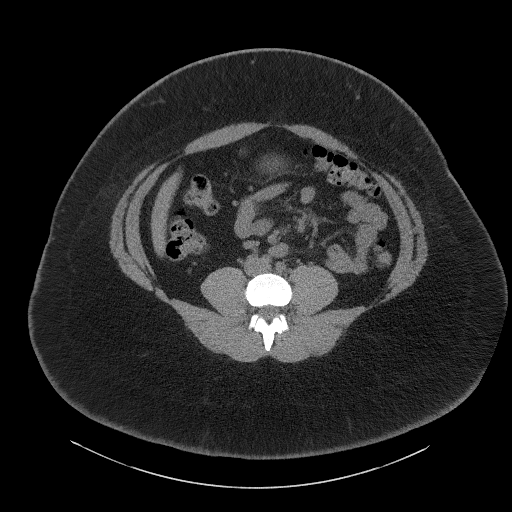
[im 49/83  soft-tissue]
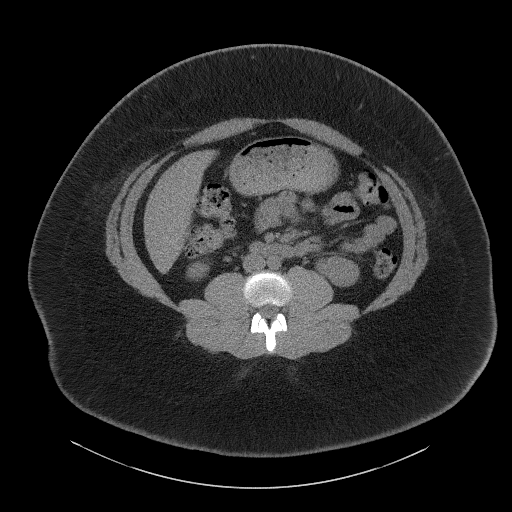
[im 54/83  soft-tissue]
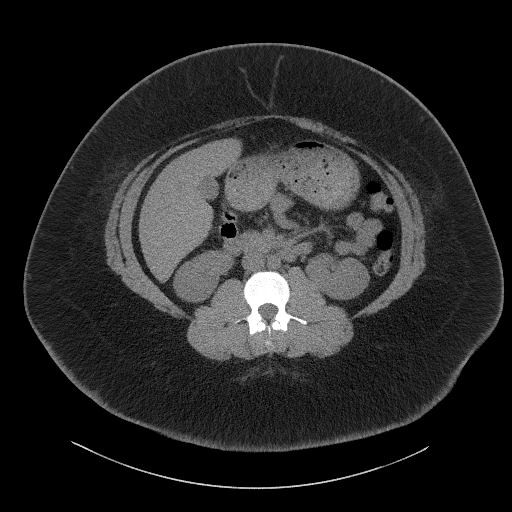
[im 54/83  bone]
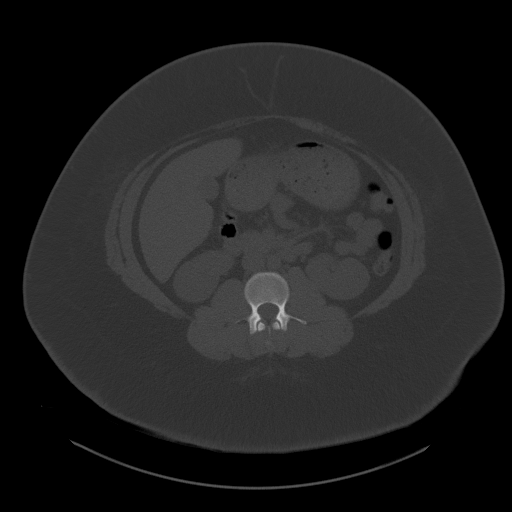
[im 58/83  soft-tissue]
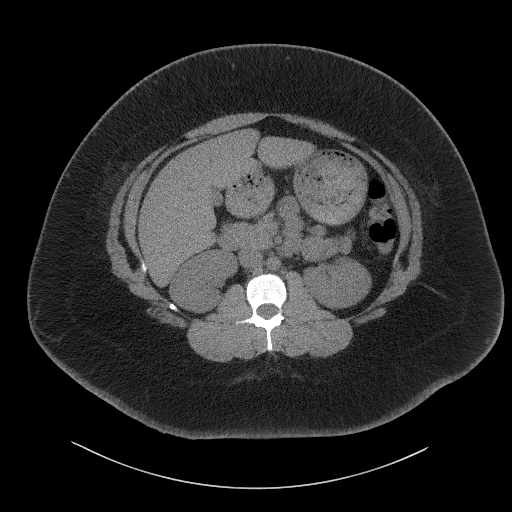
[im 63/83  soft-tissue]
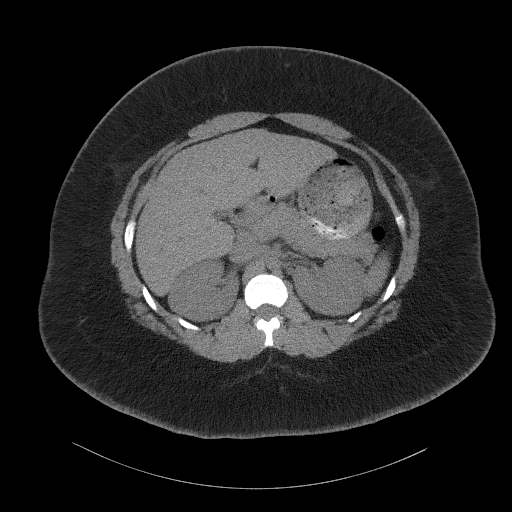
[im 73/83  soft-tissue]
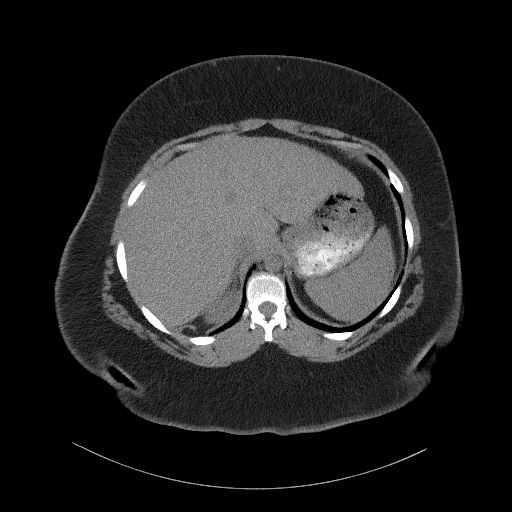
[im 78/83  soft-tissue]
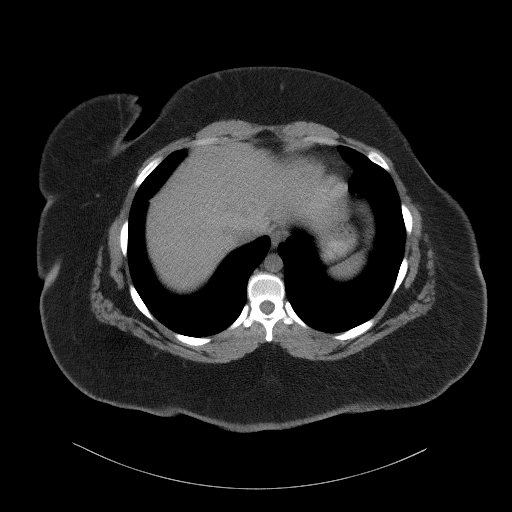

[Series 5: coronal st · coronal · 0.81mm/px · 3 of 139 slices shown]
[im 47/139  soft-tissue]
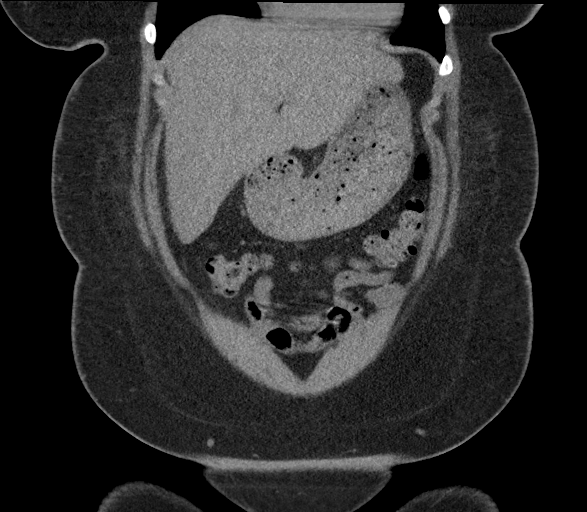
[im 62/139  soft-tissue]
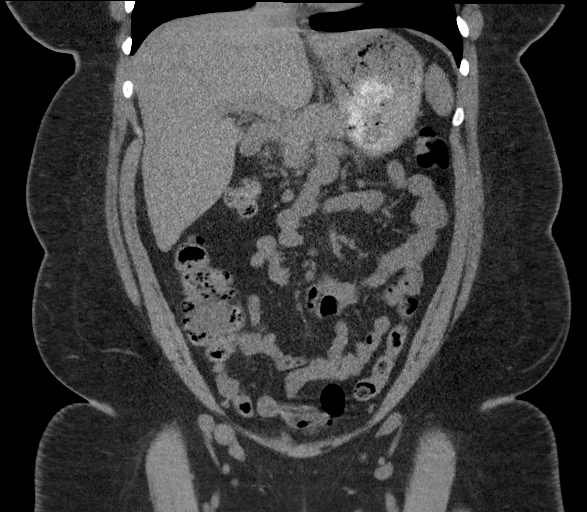
[im 77/139  soft-tissue]
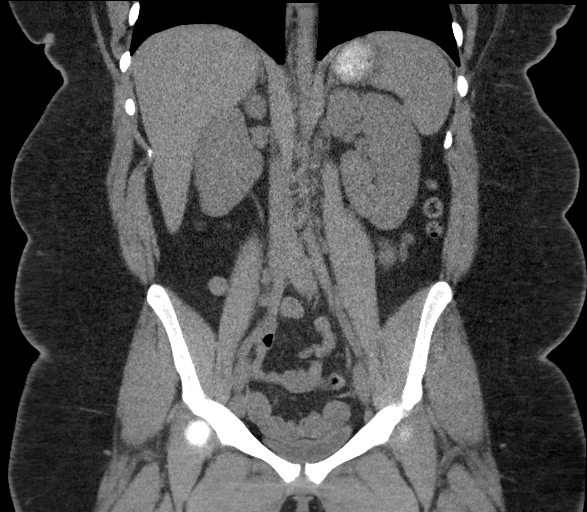

[16 of 46 positions shown; findings below may reference images not displayed]

FINDINGS: Lower chest: No acute abnormality.

Hepatobiliary: No focal liver abnormality is seen. No gallstones,
gallbladder wall thickening, or biliary dilatation.

Pancreas: Unremarkable. No pancreatic ductal dilatation or
surrounding inflammatory changes.

Spleen: Normal in size without focal abnormality.

Adrenals/Urinary Tract: Adrenal glands are unremarkable. Kidneys are
normal, without renal calculi, focal lesion, or hydronephrosis. The
urinary bladder is partially contracted and otherwise normal in
appearance.

Stomach/Bowel: Stomach is within normal limits. Appendix appears
normal. No evidence of bowel wall thickening, distention, or
inflammatory changes.

Vascular/Lymphatic: No significant vascular findings are present.

Several mesenteric lymph nodes are seen within the posterior aspect
of the mid and lower right abdomen (the largest measures
approximately 1.5 cm).

Reproductive: Uterus and bilateral adnexa are unremarkable.

Other: No abdominal wall hernia or abnormality. No abdominopelvic
ascites.

Musculoskeletal: No acute or significant osseous findings.
IMPRESSION: 1. Several mesenteric lymph nodes within the posterior aspect of the
mid and lower right abdomen. While this may be reactive in nature,
mild mesenteric adenitis cannot be excluded.
2. No evidence of renal calculi.

## 2022-07-18 ENCOUNTER — Telehealth (INDEPENDENT_AMBULATORY_CARE_PROVIDER_SITE_OTHER): Payer: No Typology Code available for payment source | Admitting: Psychiatry

## 2022-07-18 DIAGNOSIS — F900 Attention-deficit hyperactivity disorder, predominantly inattentive type: Secondary | ICD-10-CM

## 2022-07-18 DIAGNOSIS — F4310 Post-traumatic stress disorder, unspecified: Secondary | ICD-10-CM

## 2022-07-18 DIAGNOSIS — F320 Major depressive disorder, single episode, mild: Secondary | ICD-10-CM | POA: Diagnosis not present

## 2022-07-18 DIAGNOSIS — F2089 Other schizophrenia: Secondary | ICD-10-CM

## 2022-07-18 DIAGNOSIS — F411 Generalized anxiety disorder: Secondary | ICD-10-CM

## 2022-07-18 DIAGNOSIS — Z79899 Other long term (current) drug therapy: Secondary | ICD-10-CM

## 2022-07-18 MED ORDER — ARIPIPRAZOLE 10 MG PO TABS: 10.0000 mg | ORAL_TABLET | Freq: Every day | ORAL | 1 refills | Status: DC

## 2022-07-18 NOTE — Patient Instructions (Signed)
We increased your abilify to 10mg  daily for now. I will continue to monitor your appetite changes and may ultimately coordinate with your PCP if further medication intervention is warranted.
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
## 2022-08-01 ENCOUNTER — Telehealth (INDEPENDENT_AMBULATORY_CARE_PROVIDER_SITE_OTHER): Payer: No Typology Code available for payment source | Admitting: Psychiatry

## 2022-08-01 ENCOUNTER — Encounter (HOSPITAL_COMMUNITY): Payer: Self-pay | Admitting: Psychiatry

## 2022-08-01 DIAGNOSIS — F900 Attention-deficit hyperactivity disorder, predominantly inattentive type: Secondary | ICD-10-CM

## 2022-08-01 DIAGNOSIS — Z79899 Other long term (current) drug therapy: Secondary | ICD-10-CM | POA: Diagnosis not present

## 2022-08-01 DIAGNOSIS — F411 Generalized anxiety disorder: Secondary | ICD-10-CM | POA: Diagnosis not present

## 2022-08-01 DIAGNOSIS — F2089 Other schizophrenia: Secondary | ICD-10-CM | POA: Diagnosis not present

## 2022-08-01 DIAGNOSIS — F5104 Psychophysiologic insomnia: Secondary | ICD-10-CM

## 2022-08-01 DIAGNOSIS — F331 Major depressive disorder, recurrent, moderate: Secondary | ICD-10-CM

## 2022-08-01 DIAGNOSIS — Z6841 Body Mass Index (BMI) 40.0 and over, adult: Secondary | ICD-10-CM

## 2022-08-01 NOTE — Patient Instructions (Signed)
We did not make any changes to her medications today and will give more time for Abilify to take full effect.  Coordinate with your PCP to see if nutrition referral has been put in.  Try the [employer] word puzzle challenges as a form of brain training and attention testing.  Keep up the good work with exercise.
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
## 2022-08-04 ENCOUNTER — Other Ambulatory Visit: Payer: Self-pay | Admitting: Family Medicine

## 2022-08-04 DIAGNOSIS — R7303 Prediabetes: Secondary | ICD-10-CM

## 2022-08-04 DIAGNOSIS — I10 Essential (primary) hypertension: Secondary | ICD-10-CM

## 2022-08-04 MED ORDER — LISINOPRIL 10 MG PO TABS: 10.0000 mg | ORAL_TABLET | Freq: Every day | ORAL | 1 refills | Status: DC

## 2022-08-04 MED ORDER — METFORMIN HCL 500 MG PO TABS: 500.0000 mg | ORAL_TABLET | Freq: Two times a day (BID) | ORAL | 1 refills | Status: DC

## 2022-08-19 ENCOUNTER — Ambulatory Visit (INDEPENDENT_AMBULATORY_CARE_PROVIDER_SITE_OTHER): Payer: Medicaid Other | Admitting: Family Medicine

## 2022-08-19 ENCOUNTER — Encounter: Payer: Self-pay | Admitting: Family Medicine

## 2022-08-19 DIAGNOSIS — Z23 Encounter for immunization: Secondary | ICD-10-CM

## 2022-08-19 DIAGNOSIS — Z3200 Encounter for pregnancy test, result unknown: Secondary | ICD-10-CM | POA: Diagnosis not present

## 2022-08-19 DIAGNOSIS — I10 Essential (primary) hypertension: Secondary | ICD-10-CM | POA: Diagnosis not present

## 2022-08-19 HISTORY — DX: Encounter for pregnancy test, result unknown: Z32.00

## 2022-08-19 LAB — POCT URINE PREGNANCY: Preg Test, Ur: NEGATIVE

## 2022-08-19 MED ORDER — NIFEDIPINE ER OSMOTIC RELEASE 30 MG PO TB24: 30.0000 mg | ORAL_TABLET | Freq: Every day | ORAL | 2 refills | Status: DC

## 2022-08-19 NOTE — Progress Notes (Signed)
Established Patient Office Visit  Subjective:  Patient ID: Kathy Rios, female    DOB: Aug 20, 2000  Age: 22 y.o. MRN: [id number]  CC:  Chief Complaint  Patient presents with   Obesity    Pt reports since being off of Vyvance in 07/03/2022 she has been putting on a lot of weight, would like to discuss appetite suppressant, also reports having unprotected sex about a week ago 08/12/22, states she is having sx of pregnancy.     HPI Abrial Rios is a 22 y.o. female with past medical history of essential hypertension, morbid obesity presents for f/u of  chronic medical conditions. For the details of today's visit, please refer to the assessment and plan.    Past Medical History:  Diagnosis Date   ADD (attention deficit disorder)    ADHD (attention deficit hyperactivity disorder)    Anemia    Anxiety    Depression    Diabetes mellitus    Diabetes mellitus, type II ([place])    Encounter for menstrual regulation 01/25/2016   Hypertension    Irregular periods 10/25/2015   Obesity    PMS (premenstrual syndrome) 11/05/2015   PTSD (post-traumatic stress disorder)    Reactive hypoglycemia    Schizoaffective disorder, depressive type ([place])    Severe menstrual cramps 11/05/2015   Thalassemia trait    Thalassemia trait, alpha     Past Surgical History:  Procedure Laterality Date   ADENOIDECTOMY     TONSILLECTOMY     WISDOM TOOTH EXTRACTION  07/2016    Family History  Problem Relation Age of Onset   Seizures Mother    Arthritis Mother    Diabetes Mother    Hyperlipidemia Mother    Depression Mother    Hyperlipidemia Father    Hypertension Father    Gout Father    Dementia Father    Hypertension Sister    Mental illness Brother    Hyperlipidemia Brother    ADD / ADHD Brother    Depression Brother    Hypertension Maternal Grandmother    Cancer Maternal Grandmother    Hypertension Maternal Grandfather    Thyroid disease Maternal Grandfather    Cancer Maternal Grandfather     Prostate cancer Maternal Grandfather    Anemia Paternal Grandmother    Cancer Paternal Grandmother        cervical   Hypertension Paternal Grandfather    Heart disease Paternal Grandfather    Hypertension Other    Other Other        heart skips-maternal great grandma   Other Other        fibroids- maternal grandma and great grandma   Heart disease Other    Schizophrenia Maternal Aunt    Bipolar disorder Maternal Aunt    Alcohol abuse Maternal Uncle     Social History   Socioeconomic History   Marital status: Single    Spouse name: Not on file   Number of children: Not on file   Years of education: Not on file   Highest education level: Not on file  Occupational History   Not on file  Tobacco Use   Smoking status: Never   Smokeless tobacco: Never  Vaping Use   Vaping Use: Never used  Substance and Sexual Activity   Alcohol use: Yes    Comment: once per month will have 1 alcohol unit   Drug use: Not Currently    Types: Marijuana    Comment: previously every 3-6 months but hasn't smoked in  a few years   Sexual activity: Yes    Birth control/protection: Pill  Other Topics Concern   Not on file  Social History Narrative   Not on file   Social Determinants of Health   Financial Resource Strain: Medium Risk (05/12/2022)   Overall Financial Resource Strain (CARDIA)    Difficulty of Paying Living Expenses: Somewhat hard  Food Insecurity: No Food Insecurity (05/12/2022)   Hunger Vital Sign    Worried About Running Out of Food in the Last Year: Never true    Ran Out of Food in the Last Year: Never true  Transportation Needs: No Transportation Needs (05/12/2022)   PRAPARE - Hydrologist (Medical): No    Lack of Transportation (Non-Medical): No  Physical Activity: Sufficiently Active (05/12/2022)   Exercise Vital Sign    Days of Exercise per Week: 3 days    Minutes of Exercise per Session: 60 min  Stress: No Stress Concern Present  (05/12/2022)   [place]    Feeling of Stress : Only a little  Social Connections: Moderately Isolated (05/12/2022)   Social Connection and Isolation Panel [NHANES]    Frequency of Communication with Friends and Family: Once a week    Frequency of Social Gatherings with Friends and Family: Once a week    Attends Religious Services: 1 to 4 times per year    Active Member of [employer] or Organizations: Yes    Attends Archivist Meetings: 1 to 4 times per year    Marital Status: Never married  Intimate Partner Violence: Not At Risk (05/12/2022)   Humiliation, Afraid, Rape, and Kick questionnaire    Fear of Current or Ex-Partner: No    Emotionally Abused: No    Physically Abused: No    Sexually Abused: No    Outpatient Medications Prior to Visit  Medication Sig Dispense Refill   ACCU-CHEK GUIDE test strip USE TO CHECK BLOOD SUGAR DAILY     Accu-Chek Softclix Lancets lancets daily.     acetaminophen (TYLENOL) 500 MG tablet Take 1,000 mg by mouth every 6 (six) hours as needed for mild pain or headache.     ARIPiprazole (ABILIFY) 10 MG tablet Take 1 tablet (10 mg total) by mouth daily. 30 tablet 1   b complex vitamins capsule Take 1 capsule by mouth.     cloNIDine HCl (KAPVAY) 0.1 MG TB12 ER tablet Take 0.1 mg by mouth at bedtime.     FLUoxetine (PROZAC) 20 MG capsule Take 20 mg by mouth daily.     metFORMIN (GLUCOPHAGE) 500 MG tablet Take 1 tablet (500 mg total) by mouth 2 (two) times daily with a meal. 90 tablet 1   Multiple Vitamin (MULTIVITAMIN WITH MINERALS) TABS tablet Take 1 tablet by mouth daily.     Norethindrone Acetate-Ethinyl Estrad-FE (JUNEL FE 24) 1-20 MG-MCG(24) tablet Take 1 tablet by mouth daily. 28 tablet 11   UNABLE TO FIND Omega 3-takes 1 tab daily     valACYclovir (VALTREX) 1000 MG tablet Take 2 tablets by mouth 2 (two) times daily as needed (cold sores).   1   lisinopril (ZESTRIL) 10 MG tablet Take 1  tablet (10 mg total) by mouth daily. 90 tablet 1   No facility-administered medications prior to visit.    Allergies  Allergen Reactions   Selenium Sulfide Rash   Banana Other (See Comments)    headache   Motrin [Ibuprofen] Hives and Itching  Sulfa Antibiotics Rash    ROS Review of Systems  Constitutional:  Negative for chills and fever.  Eyes:  Negative for visual disturbance.  Respiratory:  Negative for chest tightness and shortness of breath.   Neurological:  Negative for dizziness and headaches.      Objective:    Physical Exam HENT:     Head: Normocephalic.     Mouth/Throat:     Mouth: Mucous membranes are moist.  Cardiovascular:     Rate and Rhythm: Normal rate.     Heart sounds: Normal heart sounds.  Pulmonary:     Effort: Pulmonary effort is normal.     Breath sounds: Normal breath sounds.  Neurological:     Mental Status: She is alert.     BP 137/86   Pulse 87   Ht _0  (1.651 m)   Wt 297 lb (134.7 kg)   LMP 08/01/2022   SpO2 97%   BMI 49.42 kg/m  Wt Readings from Last 3 Encounters:  08/19/22 297 lb (134.7 kg)  07/09/22 280 lb 6.4 oz (127.2 kg)  06/27/22 271 lb 0.6 oz (122.9 kg)    Lab Results  Component Value Date   TSH 2.020 06/30/2022   Lab Results  Component Value Date   WBC 6.1 06/30/2022   HGB 10.8 (L) 06/30/2022   HCT 34.6 06/30/2022   MCV 68 (L) 06/30/2022   PLT 403 06/30/2022   Lab Results  Component Value Date   NA 136 06/30/2022   K 5.0 06/30/2022   CO2 21 06/30/2022   GLUCOSE 98 06/30/2022   BUN 11 06/30/2022   CREATININE 0.70 06/30/2022   BILITOT <0.2 06/30/2022   ALKPHOS 117 06/30/2022   AST 12 06/30/2022   ALT 18 06/30/2022   PROT 7.5 06/30/2022   ALBUMIN 4.1 06/30/2022   CALCIUM 9.1 06/30/2022   ANIONGAP 8 09/23/2021   EGFR 125 06/30/2022   Lab Results  Component Value Date   CHOL 202 (H) 06/30/2022   Lab Results  Component Value Date   HDL 71 06/30/2022   Lab Results  Component Value Date    LDLCALC 118 (H) 06/30/2022   Lab Results  Component Value Date   TRIG 75 06/30/2022   Lab Results  Component Value Date   CHOLHDL 2.8 06/30/2022   Lab Results  Component Value Date   HGBA1C 5.8 (H) 06/30/2022      Assessment & Plan:  Morbid obesity ([place]) Assessment & Plan: She reports increased weight gain since discontinuing Vyvanse in November 2023 She complains of increased appetite and cravings for junk foods She reports increased physical activities, noting that she does water aerobics 4 days a week for an hour Patient voiced that she is trying to conceive Inform patient that phentermine is contraindicated during pregnancy Patient voices that she has an appointment with the dietitian on 08/26/2022 Reviewed my weight management plan and encouraged patient to reduce your portion size reduce your sugar, sodium, and  carbohydrates intake  Limit trans and saturated fats in your diet I recommend increasing your fiber intake and finding healthy to go snacks that is low in carbs, sugar and fat.  I recommend increasing servings of fruit and vegetables, reducing soda and limiting processed foods from your diet      Essential hypertension Assessment & Plan: Controlled She takes lisinopril 10 mg daily She reports trying to conceive Negative pregnancy test today in the clinic Will discontinue lisinopril 10 mg today and start patient on Procardia 30 mg  daily Will follow-up on BP in 4 weeks BP Readings from Last 3 Encounters:  08/19/22 137/86  07/09/22 130/84  06/27/22 126/72     Orders: -     NIFEdipine ER Osmotic Release; Take 1 tablet (30 mg total) by mouth daily.  Dispense: 30 tablet; Refill: 2  Possible pregnancy Assessment & Plan: Last coital act on 08/08/22 and 08/09/22 Last menstrual cycle 08/01/22-08/04/22 She is concerned of possible pregnancy, as she is trying to conceive with her partner Negative pregnancy test in the clinic today Encourage patient to  follow-up with her gynecologist to discontinue her birth control, given that she is trying to conceive    Orders: -     POCT urine pregnancy  Immunization due -     HPV 9-valent vaccine,Recombinat    Follow-up: Return in about 1 month (around 09/19/2022) for BP.   Alvira Monday, FNP

## 2022-08-19 NOTE — Assessment & Plan Note (Addendum)
Controlled She takes lisinopril 10 mg daily She reports trying to conceive Negative pregnancy test today in the clinic Will discontinue lisinopril 10 mg today and start patient on Procardia 30 mg daily Will follow-up on BP in 4 weeks BP Readings from Last 3 Encounters:  08/19/22 137/86  07/09/22 130/84  06/27/22 126/72

## 2022-08-19 NOTE — Assessment & Plan Note (Addendum)
Last coital act on 08/08/22 and 08/09/22 Last menstrual cycle 08/01/22-08/04/22 She is concerned of possible pregnancy, as she is trying to conceive with her partner Negative pregnancy test in the clinic today Encourage patient to follow-up with her gynecologist to discontinue her birth control, given that she is trying to conceive

## 2022-08-19 NOTE — Patient Instructions (Addendum)
I appreciate the opportunity to provide care to you today!    Follow up:  1 months for BP   I have made changes to your blood pressure medication because you are trying to get pregnant. I will follow up on your blood pressure in a month for treatment efficacy. Please follow up with GYN and your psychiatrist to make adjustments to your medications as needed, given that you are trying to conceive  For weight management, I recommend: I recommend reducing your portion size, reducing your sugar, sodium, and  carbohydrates intake and limiting trans and saturated fats in your diet.  I recommend increasing your fiber intake and finding healthy to go snacks that is low in carbs, sugar and fat.  I recommend increasing servings of fruit and vegetables, reducing soda and limiting processed foods from your diet    Please continue to a heart-healthy diet and increase your physical activities.   Physical activity helps: Lower your blood glucose, improve your heart health, lower your blood pressure and cholesterol, burn calories to help manage her weight, gave you energy, lower stress, and improve his sleep.  The American diabetes Association (ADA) recommends being active for 2-1/2 hours (150 minutes) or more week.  Exercise for 30 minutes, 5 days a week (150 minutes total)     It was a pleasure to see you and I look forward to continuing to work together on your health and well-being. Please do not hesitate to call the office if you need care or have questions about your care.   Have a wonderful day and week. With Gratitude, Alvira Monday MSN, FNP-BC

## 2022-08-19 NOTE — Assessment & Plan Note (Addendum)
She reports increased weight gain since discontinuing Vyvanse in November 2023 She complains of increased appetite and cravings for junk foods She reports increased physical activities, noting that she does water aerobics 4 days a week for an hour Patient voiced that she is trying to conceive Inform patient that phentermine is contraindicated during pregnancy Patient voices that she has an appointment with the dietitian on 08/26/2022 Reviewed my weight management plan and encouraged patient to reduce your portion size reduce your sugar, sodium, and  carbohydrates intake  Limit trans and saturated fats in your diet I recommend increasing your fiber intake and finding healthy to go snacks that is low in carbs, sugar and fat.  I recommend increasing servings of fruit and vegetables, reducing soda and limiting processed foods from your diet

## 2022-08-20 ENCOUNTER — Telehealth (HOSPITAL_COMMUNITY): Payer: No Typology Code available for payment source | Admitting: Psychiatry

## 2022-08-25 ENCOUNTER — Ambulatory Visit (INDEPENDENT_AMBULATORY_CARE_PROVIDER_SITE_OTHER): Payer: Medicaid Other | Admitting: Adult Health

## 2022-08-25 ENCOUNTER — Ambulatory Visit: Payer: Medicaid Other | Admitting: Nutrition

## 2022-08-25 ENCOUNTER — Other Ambulatory Visit (HOSPITAL_COMMUNITY)
Admission: RE | Admit: 2022-08-25 | Discharge: 2022-08-25 | Disposition: A | Discharge status: Home / Self Care | Payer: Medicaid Other | Source: Ambulatory Visit | Attending: Adult Health | Admitting: Adult Health

## 2022-08-25 ENCOUNTER — Encounter: Payer: Self-pay | Admitting: Adult Health

## 2022-08-25 ENCOUNTER — Telehealth (HOSPITAL_COMMUNITY): Payer: No Typology Code available for payment source | Admitting: Psychiatry

## 2022-08-25 VITALS — BP 131/87 | HR 99 | Ht 65.0 in | Wt 303.0 lb

## 2022-08-25 DIAGNOSIS — Z113 Encounter for screening for infections with a predominantly sexual mode of transmission: Secondary | ICD-10-CM

## 2022-08-25 DIAGNOSIS — Z3202 Encounter for pregnancy test, result negative: Secondary | ICD-10-CM | POA: Diagnosis not present

## 2022-08-25 DIAGNOSIS — I10 Essential (primary) hypertension: Secondary | ICD-10-CM

## 2022-08-25 DIAGNOSIS — Z319 Encounter for procreative management, unspecified: Secondary | ICD-10-CM | POA: Diagnosis not present

## 2022-08-25 HISTORY — DX: Encounter for procreative management, unspecified: Z31.9

## 2022-08-25 LAB — POCT URINE PREGNANCY: Preg Test, Ur: NEGATIVE

## 2022-08-25 MED ORDER — PRENATAL PLUS 27-1 MG PO TABS: 1.0000 | ORAL_TABLET | Freq: Every day | ORAL | 12 refills | Status: DC

## 2022-08-25 NOTE — Progress Notes (Signed)
  Subjective:     Patient ID: Kathy Rios, female   DOB: 2001/06/28, 22 y.o.   MRN: [id number]  HPI Kathy Rios is a 22 year old black female, single, G0P0, in wanting to talk about getting pregnant, but recently broke up with boyfriend, she says he cheated. She stopped birth control pills in December. She wants STD testing too.  Last pap was negative 04/29/21.  PCP is Alvira Monday, NP.  Review of Systems Patient wants STD testing Period was 08/18/22   Reviewed past medical,surgical, social and family history. Reviewed medications and allergies.  Objective:   Physical Exam BP 131/87 (BP Location: Right Arm, Patient Position: Sitting, Cuff Size: Normal)   Pulse 99   Ht 5\' 5"  (1.651 m)   Wt (!) 303 lb (137.4 kg)   LMP 08/18/2022   BMI 50.42 kg/m  UPT was negative. She did self swab. Skin warm and dry. Lungs: clear to ausculation bilaterally. Cardiovascular: regular rate and rhythm.  A1c was 5.8 06/30/22.   Fall risk is low  Upstream - 08/25/22 1215       Pregnancy Intention Screening   Does the patient want to become pregnant in the next year? Yes    Does the patient's partner want to become pregnant in the next year? Yes    Would the patient like to discuss contraceptive options today? No      Contraception Wrap Up   Current Method Abstinence    End Method Pregnant/Seeking Pregnancy;Female Condom             Assessment:     1. Screening examination for STD (sexually transmitted disease) CV swab sent for GC/CHL,trich,BV and yeast  - Cervicovaginal ancillary only( [place]) - Hepatitis C antibody - Hepatitis B surface antigen - HIV Antibody (routine testing w rflx) - RPR  2. Pregnancy examination or test, negative result - POCT urine pregnancy  3. Morbid obesity ([place]) Try to lose some weight, at least 30 lbs before trying to get pregnant Follow up with PCP  4. Patient desires pregnancy Discussed using condoms for now Will rx PNV Meds ordered this encounter   Medications   prenatal vitamin w/FE, FA (PRENATAL 1 + 1) 27-1 MG TABS tablet    Sig: Take 1 tablet by mouth daily at 12 noon.    Dispense:  30 tablet    Refill:  12    Order Specific Question:   Supervising Provider    Answer:   Elonda Husky, LUTHER H [[id number]]     5. Essential hypertension Has changed meds recently, will follow up with PCP    Plan:     Follow up in 3 months

## 2022-08-26 ENCOUNTER — Other Ambulatory Visit: Payer: Self-pay | Admitting: Adult Health

## 2022-08-26 ENCOUNTER — Telehealth (INDEPENDENT_AMBULATORY_CARE_PROVIDER_SITE_OTHER): Payer: No Typology Code available for payment source | Admitting: Psychiatry

## 2022-08-26 ENCOUNTER — Ambulatory Visit (INDEPENDENT_AMBULATORY_CARE_PROVIDER_SITE_OTHER): Payer: Medicaid Other | Admitting: Internal Medicine

## 2022-08-26 ENCOUNTER — Encounter: Payer: Self-pay | Admitting: Internal Medicine

## 2022-08-26 ENCOUNTER — Encounter (HOSPITAL_COMMUNITY): Payer: Self-pay | Admitting: Psychiatry

## 2022-08-26 DIAGNOSIS — F2089 Other schizophrenia: Secondary | ICD-10-CM

## 2022-08-26 DIAGNOSIS — F411 Generalized anxiety disorder: Secondary | ICD-10-CM

## 2022-08-26 DIAGNOSIS — F900 Attention-deficit hyperactivity disorder, predominantly inattentive type: Secondary | ICD-10-CM

## 2022-08-26 DIAGNOSIS — F3341 Major depressive disorder, recurrent, in partial remission: Secondary | ICD-10-CM

## 2022-08-26 DIAGNOSIS — F4310 Post-traumatic stress disorder, unspecified: Secondary | ICD-10-CM

## 2022-08-26 DIAGNOSIS — F5104 Psychophysiologic insomnia: Secondary | ICD-10-CM

## 2022-08-26 DIAGNOSIS — R7303 Prediabetes: Secondary | ICD-10-CM

## 2022-08-26 DIAGNOSIS — Z79899 Other long term (current) drug therapy: Secondary | ICD-10-CM

## 2022-08-26 LAB — RPR: RPR Ser Ql: NONREACTIVE

## 2022-08-26 LAB — HEPATITIS B SURFACE ANTIGEN: Hepatitis B Surface Ag: NEGATIVE

## 2022-08-26 LAB — CERVICOVAGINAL ANCILLARY ONLY
Bacterial Vaginitis (gardnerella): POSITIVE — AB
Candida Glabrata: NEGATIVE
Candida Vaginitis: POSITIVE — AB
Chlamydia: NEGATIVE
Comment: NEGATIVE
Comment: NEGATIVE
Comment: NEGATIVE
Comment: NEGATIVE
Comment: NEGATIVE
Comment: NORMAL
Neisseria Gonorrhea: NEGATIVE
Trichomonas: NEGATIVE

## 2022-08-26 LAB — HEPATITIS C ANTIBODY: Hep C Virus Ab: NONREACTIVE

## 2022-08-26 LAB — HIV ANTIBODY (ROUTINE TESTING W REFLEX): HIV Screen 4th Generation wRfx: NONREACTIVE

## 2022-08-26 MED ORDER — METRONIDAZOLE 500 MG PO TABS: 500.0000 mg | ORAL_TABLET | Freq: Two times a day (BID) | ORAL | 0 refills | Status: DC

## 2022-08-26 MED ORDER — FLUCONAZOLE 150 MG PO TABS: ORAL_TABLET | ORAL | 1 refills | Status: DC

## 2022-08-26 NOTE — Assessment & Plan Note (Signed)
Stop Metformin Meet with RD and start exercise program at [facility], [place] Follow up in February with PCP to check our hemoglobin A1c. If elevated we can consider starting GLP-1 which will help with obesity.

## 2022-08-26 NOTE — Patient Instructions (Addendum)
Thank you for trusting me with your care. To recap, today we discussed the following:   - You have already had a referral placed with a registered dietician.  - I have place a referral for the provider referral exercise program (PREP).  [facility] will call you to set up your appointment.  -I recommend stopping metformin, we will restart medications if you are diabetic when we check in February.

## 2022-08-26 NOTE — Patient Instructions (Signed)
We did not make any medication changes today but will give more time for her brain to recover and stay on the current doses of your medication.  In the few to likely try a nonstimulant if we are still finding that your thinking is impacted.  Keep up the good work I know you are trying really hard in school!

## 2022-08-26 NOTE — Assessment & Plan Note (Addendum)
BMI 50 , weight is 304 lbs.Weight gain has been gradual. TSH reviewed and normal recently.  - You have already had a referral placed with a registered dietician.  - I have placed a referral for the provider referral exercise program (PREP).  [facility] to set up your appointment.  - Follow up in February with PCP to discuss progress.

## 2022-08-26 NOTE — Progress Notes (Signed)
   HPI:Ms.Ivyanna Sibert is a 22 y.o. female who presents for evaluation of Weight Gain (Concerned about weight gain. Not trying to get pregnant anymore and was wondering about an appetite suppressant.)  For the details of today's visit, please refer to the assessment and plan.  Physical Exam: Vitals:   08/26/22 1108  BP: 132/82  Pulse: (!) 104  Resp: 16  SpO2: 97%  Weight: (!) 304 lb (137.9 kg)  Height: 5\' 5"  (1.651 m)     Physical Exam Constitutional:      Appearance: She is well-developed and well-groomed. She is obese. She is not ill-appearing.  Cardiovascular:     Rate and Rhythm: Normal rate and regular rhythm.  Pulmonary:     Effort: Pulmonary effort is normal. No respiratory distress.     Breath sounds: No wheezing.  Psychiatric:        Mood and Affect: Mood normal.        Behavior: Behavior normal.      Assessment & Plan:   Morbid obesity ([facility]) BMI 50 , weight is 304 lbs.Weight gain has been gradual. TSH reviewed and normal recently.  - You have already had a referral placed with a registered dietician.  - I have placed a referral for the provider referral exercise program (PREP).  [facility] to [facility] up your appointment.  - Follow up in February with PCP to discuss progress.   Prediabetes Stop Metformin Meet with RD and start exercise program at [facility], [place] Follow up in February with PCP to check our hemoglobin A1c. If elevated we can consider starting GLP-1 which will help with obesity.     Lorene Dy, MD
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
## 2022-08-26 NOTE — Progress Notes (Signed)
+  BV and yeast on vaginal swab, will rx flagyl and diflucan, no sex or alcohol while taking  

## 2022-09-01 ENCOUNTER — Encounter: Payer: Self-pay | Admitting: Neurology

## 2022-09-01 ENCOUNTER — Ambulatory Visit: Payer: Medicaid Other | Admitting: Neurology

## 2022-09-01 VITALS — BP 101/74 | HR 99 | Ht 65.0 in | Wt 305.0 lb

## 2022-09-01 DIAGNOSIS — R404 Transient alteration of awareness: Secondary | ICD-10-CM | POA: Diagnosis not present

## 2022-09-01 NOTE — Progress Notes (Signed)
[facility]  PATIENT: Kathy Rios DOB: 2001/02/17  REQUESTING CLINICIAN: Alvira Monday, FNP HISTORY FROM: Patient  REASON FOR VISIT: Abnormal smell (?Hallucinations)    HISTORICAL  CHIEF COMPLAINT:  Chief Complaint  Patient presents with   New Patient (Initial Visit)    Rm 12. Alone. NP Internal referral for olfactory hallucinations, ? Seizures. Has not reported any hallucinations/seizure like activity since Abilify dosage has been increased to 10 MG in December 2023.    HISTORY OF PRESENT ILLNESS:  This is a 22 year old woman with past medical history of generalized anxiety, depression, schizophrenia versus schizoaffective disorder with depressive symptoms, ADHD, hypertension, prediabetes, alpha thalassemia trait, who is presenting with concern of olfactory seizures.  Patient reports for the past 3 to 4 years, she has been having episodes where she smells things that she is the only one who can smell it.  They varies from smell of fried fish, blueberry cobbler, smelling of coconut.  This can last up to a minute and then go away.  It used to happen 2-3 times and she thought it was part of her schizophrenia because she also have auditory, visual and tactile hallucinations.  She reported in December her psychiatrist increased the Abilify to 10 mg and she has not had any additional symptoms.  She denies any history of seizure or seizure-like activity.  Denies any seizure risk factors. 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   OTHER MEDICAL CONDITIONS: Schizophrenia, ADHD, Hypertension, Prediabetes, alpha Thalassemia trait, PTSD, GAD     REVIEW OF SYSTEMS: Full 14 system review of systems performed and negative with exception of: As noted in the HPI   ALLERGIES: Allergies  Allergen Reactions   Selenium Sulfide Rash   Banana Other (See Comments)    headache   Motrin [Ibuprofen] Hives and Itching    Sulfa Antibiotics Rash    HOME MEDICATIONS: Outpatient Medications Prior to Visit  Medication Sig Dispense Refill   ACCU-CHEK GUIDE test strip USE TO CHECK BLOOD SUGAR DAILY     Accu-Chek Softclix Lancets lancets daily.     acetaminophen (TYLENOL) 500 MG tablet Take 1,000 mg by mouth every 6 (six) hours as needed for mild pain or headache.     ARIPiprazole (ABILIFY) 10 MG tablet Take 1 tablet (10 mg total) by mouth daily. 30 tablet 1   b complex vitamins capsule Take 1 capsule by mouth.     cloNIDine HCl (KAPVAY) 0.1 MG TB12 ER tablet Take 0.1 mg by mouth at bedtime.     fluconazole (DIFLUCAN) 150 MG tablet Take 1 now and 1 in 3 days 2 tablet 1   FLUoxetine (PROZAC) 20 MG capsule Take 20 mg by mouth daily.     metroNIDAZOLE (FLAGYL) 500 MG tablet Take 1 tablet (500 mg total) by mouth 2 (two) times daily. 14 tablet 0   NIFEdipine (PROCARDIA-XL/NIFEDICAL-XL) 30 MG 24 hr tablet Take 1 tablet (30 mg total) by mouth daily. 30 tablet 2   prenatal vitamin w/FE, FA (PRENATAL 1 + 1) 27-1 MG TABS tablet Take 1 tablet by mouth daily at 12 noon. 30 tablet 12   UNABLE TO FIND Omega 3-takes 1 tab daily     valACYclovir (VALTREX) 1000 MG tablet Take 2 tablets by mouth 2 (two) times daily as needed (cold sores).   1   No facility-administered medications prior to visit.    PAST MEDICAL HISTORY: Past Medical History:  Diagnosis Date  ADD (attention deficit disorder)    ADHD (attention deficit hyperactivity disorder)    Anemia    Anxiety    Depression    Diabetes mellitus    Diabetes mellitus, type II (HCC)    Encounter for menstrual regulation 01/25/2016   Hypertension    Irregular periods 10/25/2015   Obesity    PMS (premenstrual syndrome) 11/05/2015   Possible pregnancy 08/19/2022   PTSD (post-traumatic stress disorder)    Reactive hypoglycemia    Schizoaffective disorder, depressive type (HCC)    Severe menstrual cramps 11/05/2015   Thalassemia trait    Thalassemia trait, alpha      PAST SURGICAL HISTORY: Past Surgical History:  Procedure Laterality Date   ADENOIDECTOMY     TONSILLECTOMY     WISDOM TOOTH EXTRACTION  07/2016    FAMILY HISTORY: Family History  Problem Relation Age of Onset   Seizures Mother    Arthritis Mother    Diabetes Mother    Hyperlipidemia Mother    Depression Mother    Hyperlipidemia Father    Hypertension Father    Gout Father    Dementia Father    Hypertension Sister    Mental illness Brother    Hyperlipidemia Brother    ADD / ADHD Brother    Depression Brother    Hypertension Maternal Grandmother    Cancer Maternal Grandmother    Hypertension Maternal Grandfather    Thyroid disease Maternal Grandfather    Cancer Maternal Grandfather    Prostate cancer Maternal Grandfather    Anemia Paternal Grandmother    Cancer Paternal Grandmother        cervical   Hypertension Paternal Grandfather    Heart disease Paternal Grandfather    Hypertension Other    Other Other        heart skips-maternal great grandma   Other Other        fibroids- maternal grandma and great grandma   Heart disease Other    Schizophrenia Maternal Aunt    Bipolar disorder Maternal Aunt    Alcohol abuse Maternal Uncle     SOCIAL HISTORY: Social History   Socioeconomic History   Marital status: Single    Spouse name: Not on file   Number of children: Not on file   Years of education: Not on file   Highest education level: Not on file  Occupational History   Not on file  Tobacco Use   Smoking status: Never   Smokeless tobacco: Never  Vaping Use   Vaping Use: Never used  Substance and Sexual Activity   Alcohol use: Yes    Comment: once per month will have 1 alcohol unit   Drug use: Not Currently    Types: Marijuana    Comment: previously every 3-6 months but hasn't smoked in a few years   Sexual activity: Not Currently    Birth control/protection: None  Other Topics Concern   Not on file  Social History Narrative   Not on file    Social Determinants of Health   Financial Resource Strain: Medium Risk (05/12/2022)   Overall Financial Resource Strain (CARDIA)    Difficulty of Paying Living Expenses: Somewhat hard  Food Insecurity: No Food Insecurity (05/12/2022)   Hunger Vital Sign    Worried About Running Out of Food in the Last Year: Never true    Ran Out of Food in the Last Year: Never true  Transportation Needs: No Transportation Needs (05/12/2022)   PRAPARE - Transportation    Lack  of Transportation (Medical): No    Lack of Transportation (Non-Medical): No  Physical Activity: Sufficiently Active (05/12/2022)   Exercise Vital Sign    Days of Exercise per Week: 3 days    Minutes of Exercise per Session: 60 min  Stress: No Stress Concern Present (05/12/2022)   [employer] of Occupational Health - Occupational Stress Questionnaire    Feeling of Stress : Only a little  Social Connections: Moderately Isolated (05/12/2022)   Social Connection and Isolation Panel [NHANES]    Frequency of Communication with Friends and Family: Once a week    Frequency of Social Gatherings with Friends and Family: Once a week    Attends Religious Services: 1 to 4 times per year    Active Member of [employer] or Organizations: Yes    Attends Banker Meetings: 1 to 4 times per year    Marital Status: Never married  Intimate Partner Violence: Not At Risk (05/12/2022)   Humiliation, Afraid, Rape, and Kick questionnaire    Fear of Current or Ex-Partner: No    Emotionally Abused: No    Physically Abused: No    Sexually Abused: No    PHYSICAL EXAM  GENERAL EXAM/CONSTITUTIONAL: Vitals:  Vitals:   09/01/22 1042  BP: 101/74  Pulse: 99  Weight: (!) 305 lb (138.3 kg)  Height: 5\' 5"  (1.651 m)   Body mass index is 50.75 kg/m. Wt Readings from Last 3 Encounters:  09/01/22 (!) 305 lb (138.3 kg)  08/26/22 (!) 304 lb (137.9 kg)  08/25/22 (!) 303 lb (137.4 kg)   Patient is in no distress; well developed, nourished and  groomed; neck is supple  EYES: Visual fields full to confrontation, Extraocular movements intacts,   MUSCULOSKELETAL: Gait, strength, tone, movements noted in Neurologic exam below  NEUROLOGIC: MENTAL STATUS:      No data to display         awake, alert, oriented to person, place and time recent and remote memory intact normal attention and concentration language fluent, comprehension intact, naming intact fund of knowledge appropriate  CRANIAL NERVE:  2nd, 3rd, 4th, 6th - Visual fields full to confrontation, extraocular muscles intact, no nystagmus 5th - facial sensation symmetric 7th - facial strength symmetric 8th - hearing intact 9th - palate elevates symmetrically, uvula midline 11th - shoulder shrug symmetric 12th - tongue protrusion midline  MOTOR:  normal bulk and tone, full strength in the BUE, BLE  SENSORY:  normal and symmetric to light touch  COORDINATION:  finger-nose-finger, fine finger movements normal  GAIT/STATION:  normal     DIAGNOSTIC DATA (LABS, IMAGING, TESTING) - I reviewed patient records, labs, notes, testing and imaging myself where available.  Lab Results  Component Value Date   WBC 6.1 06/30/2022   HGB 10.8 (L) 06/30/2022   HCT 34.6 06/30/2022   MCV 68 (L) 06/30/2022   PLT 403 06/30/2022      Component Value Date/Time   NA 136 06/30/2022 1039   K 5.0 06/30/2022 1039   CL 104 06/30/2022 1039   CO2 21 06/30/2022 1039   GLUCOSE 98 06/30/2022 1039   GLUCOSE 106 (H) 09/23/2021 1824   BUN 11 06/30/2022 1039   CREATININE 0.70 06/30/2022 1039   CALCIUM 9.1 06/30/2022 1039   PROT 7.5 06/30/2022 1039   ALBUMIN 4.1 06/30/2022 1039   AST 12 06/30/2022 1039   ALT 18 06/30/2022 1039   ALKPHOS 117 06/30/2022 1039   BILITOT <0.2 06/30/2022 1039   GFRNONAA >60 09/23/2021 1824  GFRAA 146 08/08/2020 1453   Lab Results  Component Value Date   CHOL 202 (H) 06/30/2022   HDL 71 06/30/2022   LDLCALC 118 (H) 06/30/2022   TRIG 75  06/30/2022   CHOLHDL 2.8 06/30/2022   Lab Results  Component Value Date   HGBA1C 5.8 (H) 06/30/2022   Lab Results  Component Value Date   VITAMINB12 401 05/23/2021   Lab Results  Component Value Date   TSH 2.020 06/30/2022    CT Head 06/04/2021 1. Normal noncontrast CT appearance of the brain.    ASSESSMENT AND PLAN  22 y.o. year old female with history of schizophrenia versus schizoaffective disorder with depression, generalized anxiety, depression, who is presenting with complaint of olfactory hallucinations versus seizures.  She does have a history of ?schizophrenia with auditory, visual, and tactile hallucinations.  These olfactory symptoms can be as well part of the diagnosis.  She also reports that these events varies, going from smell of coconut to bluberry cobblers, and fried fish which argue against seizures.  Abnormal smell associated with seizures tend to be stereotypical meaning only 1 smell, in particular smell of smoke versus burnt tires. The fact that these smells varied and they improved with increase of Abilify point out to a different etiology rather than seizure.  I will still obtain a routine EEG and will call patient to go over the result.  If normal she can continue to follow with PCP and psychiatry.  Return as needed.     1. Altered sensorium     Patient Instructions  Continue current medications Routine EEG, I will contact you to go over the result Continue to follow with PCP and psychiatrist Return as needed  Orders Placed This Encounter  Procedures   EEG adult    No orders of the defined types were placed in this encounter.   Return if symptoms worsen or fail to improve.  I have spent a total of 45 minutes dedicated to this patient today, preparing to see patient, performing a medically appropriate examination and evaluation, ordering tests and/or medications and procedures, and counseling and educating the patient/family/caregiver; independently  interpreting result and communicating results to the family/patient/caregiver; and documenting clinical information in the electronic medical record.    Alric Ran, MD 09/01/2022, 12:22 PM  [facility] [address] [place], [place] [address] [phone]

## 2022-09-01 NOTE — Patient Instructions (Signed)
Continue current medications Routine EEG, I will contact you to go over the result Continue to follow with PCP and psychiatrist Return as needed

## 2022-09-05 ENCOUNTER — Ambulatory Visit (HOSPITAL_COMMUNITY): Payer: No Typology Code available for payment source | Admitting: Psychiatry

## 2022-09-05 ENCOUNTER — Encounter (HOSPITAL_COMMUNITY): Payer: Self-pay | Admitting: Psychiatry

## 2022-09-05 DIAGNOSIS — F4310 Post-traumatic stress disorder, unspecified: Secondary | ICD-10-CM | POA: Diagnosis not present

## 2022-09-05 DIAGNOSIS — F411 Generalized anxiety disorder: Secondary | ICD-10-CM | POA: Diagnosis not present

## 2022-09-05 DIAGNOSIS — Z79899 Other long term (current) drug therapy: Secondary | ICD-10-CM | POA: Diagnosis not present

## 2022-09-05 DIAGNOSIS — F2089 Other schizophrenia: Secondary | ICD-10-CM | POA: Diagnosis not present

## 2022-09-05 DIAGNOSIS — F3341 Major depressive disorder, recurrent, in partial remission: Secondary | ICD-10-CM

## 2022-09-05 MED ORDER — ARIPIPRAZOLE 15 MG PO TABS: 15.0000 mg | ORAL_TABLET | Freq: Every day | ORAL | 1 refills | Status: DC

## 2022-09-05 NOTE — Patient Instructions (Signed)
We increased the Abilify to 15 mg once daily.  This should help a little bit more with the brain fog and derealization.  Let's try doing that homework assignment that you and I talked about as a test to see where things get hard that way we can plan on how to navigate when you go back to class potentially in March.  I set up a time to talk with your therapist on Monday at 11:30 AM.
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
## 2022-09-09 ENCOUNTER — Encounter: Payer: Self-pay | Admitting: Nutrition

## 2022-09-09 ENCOUNTER — Encounter: Payer: Medicaid Other | Attending: Family Medicine | Admitting: Nutrition

## 2022-09-09 VITALS — Ht 65.0 in | Wt 307.0 lb

## 2022-09-09 DIAGNOSIS — R7303 Prediabetes: Secondary | ICD-10-CM | POA: Insufficient documentation

## 2022-09-09 DIAGNOSIS — Z6841 Body Mass Index (BMI) 40.0 and over, adult: Secondary | ICD-10-CM | POA: Diagnosis present

## 2022-09-09 DIAGNOSIS — E782 Mixed hyperlipidemia: Secondary | ICD-10-CM | POA: Diagnosis present

## 2022-09-09 DIAGNOSIS — I10 Essential (primary) hypertension: Secondary | ICD-10-CM | POA: Insufficient documentation

## 2022-09-09 NOTE — Progress Notes (Signed)
Medical Nutrition Therapy  Appointment Start time:  0930  Appointment End time:  76  Primary concerns today: Obesity, PreDM  Referral diagnosis:R73.03, E66.01 Preferred learning style: no preference  Learning readiness:   Ready    NUTRITION ASSESSMENT  22 yr old bfemale here for predm, obesity, HTN and hyperlipidemia. PMH: Major depression, schizophrenia, Anxiety, PTSD, Alpha thalsessemia, trait, ADD and predm, morbid obesity. Had been on Vyvyanse for ADD in  May 2023 and was taken off of it in Nov 23. Lost down to 270 lbs while on it and then gained 30 lbs back since being off of it. She notes she had been on a vegan diet 2 years ago and lost weight and felt really good. But slipped back into eating processed and convenient foods.    She is motivated and willing to follow Lifestyle Medicine to improve her health and lose weight.  Anthropometrics  Wt Readings from Last 3 Encounters:  09/09/22 (!) 307 lb (139.3 kg)  09/01/22 (!) 305 lb (138.3 kg)  08/26/22 (!) 304 lb (137.9 kg)   Ht Readings from Last 3 Encounters:  09/09/22 _0  (1.651 m)  09/01/22 _1  (1.651 m)  08/26/22 _2  (1.651 m)   Body mass index is 51.09 kg/m. _3 @ Facility age limit for growth %iles is 20 years. Facility age limit for growth %iles is 20 years.   Clinical Medical Hx:  See chart Medications: See chart Labs:  Lab Results  Component Value Date   HGBA1C 5.8 (H) 06/30/2022      Latest Ref Rng & Units 06/30/2022   10:39 AM 09/23/2021    6:24 PM 06/03/2021    7:08 PM  CMP  Glucose 70 - 99 mg/dL 98  106  110   BUN 6 - 20 mg/dL _4 Creatinine 0.57 - 1.00 mg/dL 0.70  0.70  0.66   Sodium 134 - 144 mmol/L 136  135  137   Potassium 3.5 - 5.2 mmol/L 5.0  4.0  3.9   Chloride 96 - 106 mmol/L 104  104  107   CO2 20 - 29 mmol/L _5 Calcium 8.7 - 10.2 mg/dL 9.1  9.3  8.8   Total Protein 6.0 - 8.5 g/dL 7.5  8.1  7.8   Total Bilirubin 0.0 - 1.2 mg/dL <0.2  0.1  0.2   Alkaline Phos  44 - 121 IU/L 117  75  89   AST 0 - 40 IU/L _6 ALT 0 - 32 IU/L _7 Lipid Panel     Component Value Date/Time   CHOL 202 (H) 06/30/2022 1039   TRIG 75 06/30/2022 1039   HDL 71 06/30/2022 1039   CHOLHDL 2.8 06/30/2022 1039   LDLCALC 118 (H) 06/30/2022 1039   LABVLDL 13 06/30/2022 1039    Notable Signs/Symptoms: Increased thirsty, iincreased appetite.  Lifestyle & Dietary Hx Lives with her parents.  She cooks and eats what they provide.Eats out some and cooks for herself also. She admits she eats meals and snacks and doesn't get full.   Estimated daily fluid intake: 64. oz Supplements: prenatal vitamins Sleep: 8 hrs  Stress / self-care: living in house with parents and family members Current average weekly physical activity:  Has been exercisidng 3 times per week at the [facility] for an hour; cardio, strengthening   24-Hr Dietary Recall First Meal: Cinnamon toast crunch 2  cups, oatmilk, water Snack: Cookies-choc chip, 3-4, 1 c koolaid regular Second Meal: skipped Snack:  Third Meal:  4 pm Pork loin, cabbage, mac/cheese, Sparking water with SF drink packet 7-8 pm Snack: cookie, chili beans, 1/2 c,  Beverages: water, koolaid,  Estimated Energy Needs Calories: 1200 Carbohydrate: 135g Protein: 90g Fat: 33g   NUTRITION DIAGNOSIS  NB-1.1 Food and nutrition-related knowledge deficit As related to Obesity and predm.  As evidenced by A1C 5.8% and diet recall.   NUTRITION INTERVENTION  Nutrition education (E-1) on the following topics:  Lifestyle Medicine  - Whole Food, Plant Predominant Nutrition is highly recommended: Eat Plenty of vegetables, Mushrooms, fruits, Legumes, Whole Grains, Nuts, seeds in lieu of processed meats, processed snacks/pastries red meat, poultry, eggs.    -It is better to avoid simple carbohydrates including: Cakes, Sweet Desserts, Ice Cream, Soda (diet and regular), Sweet Tea, Candies, Chips, Cookies, Store Bought Juices, Alcohol in  Excess of  1-2 drinks a day, Lemonade,  Artificial Sweeteners, Doughnuts, Coffee Creamers, "Sugar-free" Products, etc, etc.  This is not a complete list.....  Exercise: If you are able: 30 -60 minutes a day ,4 days a week, or 150 minutes a week.  The longer the better.  Combine stretch, strength, and aerobic activities.  If you were told in the past that you have high risk for cardiovascular diseases, you may seek evaluation by your heart doctor prior to initiating moderate to intense exercise programs.   Handouts Provided Include  Lifestyle Medicine  Learning Style & Readiness for Change Teaching method utilized: Visual & Auditory  Demonstrated degree of understanding via: Teach Back  Barriers to learning/adherence to lifestyle change: none  Goals Established by Pt Goals  Cut out animal meat Focus on more whole plant based foods-fruits, vegetables and whole grains.(Foods from garden) Cut out processed foods-fast foods, junk food  Drink a gallon of water per day Keep working out 3-4 times per week; 150 minutes a week. Lose 1-2 lbs per week Track foods in lose it app.   MONITORING & EVALUATION Dietary intake, weekly physical activity, and weight  in 1 month.  Next Steps  Patient is to work on meal prepping and meal planning. Watch FORKS OVER KNIVES or We are what we eat.Marland Kitchen

## 2022-09-09 NOTE — Patient Instructions (Addendum)
Goals  Cut out animal meat Focus on more whole plant based foods-fruits, vegetables and whole grains.(Foods from garden) Cut out processed foods-fast foods, junk food  Drink a gallon of water per day Keep working out 3-4 times per week; 150 minutes a week. Lose 1-2 lbs per week Track foods in lose it app.

## 2022-09-16 ENCOUNTER — Telehealth (INDEPENDENT_AMBULATORY_CARE_PROVIDER_SITE_OTHER): Payer: No Typology Code available for payment source | Admitting: Psychiatry

## 2022-09-16 DIAGNOSIS — F4310 Post-traumatic stress disorder, unspecified: Secondary | ICD-10-CM

## 2022-09-16 DIAGNOSIS — F2089 Other schizophrenia: Secondary | ICD-10-CM

## 2022-09-16 DIAGNOSIS — Z79899 Other long term (current) drug therapy: Secondary | ICD-10-CM

## 2022-09-16 DIAGNOSIS — F411 Generalized anxiety disorder: Secondary | ICD-10-CM

## 2022-09-16 DIAGNOSIS — F3341 Major depressive disorder, recurrent, in partial remission: Secondary | ICD-10-CM

## 2022-09-16 DIAGNOSIS — F900 Attention-deficit hyperactivity disorder, predominantly inattentive type: Secondary | ICD-10-CM | POA: Diagnosis not present

## 2022-09-16 DIAGNOSIS — F5104 Psychophysiologic insomnia: Secondary | ICD-10-CM | POA: Diagnosis not present
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
## 2022-09-16 NOTE — Patient Instructions (Signed)
We did not make any medication changes today.  Keep trying to do that homework assignment we talked about it would be a good barometer for when you are ready to go back to school.

## 2022-09-22 ENCOUNTER — Encounter: Payer: Self-pay | Admitting: Family Medicine

## 2022-09-22 ENCOUNTER — Ambulatory Visit (INDEPENDENT_AMBULATORY_CARE_PROVIDER_SITE_OTHER): Payer: Medicaid Other | Admitting: Family Medicine

## 2022-09-22 VITALS — BP 118/72 | HR 110 | Ht 65.0 in | Wt 307.1 lb

## 2022-09-22 DIAGNOSIS — I10 Essential (primary) hypertension: Secondary | ICD-10-CM | POA: Diagnosis not present

## 2022-09-22 DIAGNOSIS — R7301 Impaired fasting glucose: Secondary | ICD-10-CM

## 2022-09-22 DIAGNOSIS — E559 Vitamin D deficiency, unspecified: Secondary | ICD-10-CM

## 2022-09-22 DIAGNOSIS — E038 Other specified hypothyroidism: Secondary | ICD-10-CM | POA: Diagnosis not present

## 2022-09-22 DIAGNOSIS — R7303 Prediabetes: Secondary | ICD-10-CM

## 2022-09-22 DIAGNOSIS — E7849 Other hyperlipidemia: Secondary | ICD-10-CM

## 2022-09-22 NOTE — Assessment & Plan Note (Signed)
Controlled She takes Procardia 30 mg daily Denies headaches, dizziness, chest pain, palpitation She reports adherence with treatment regimen and compliance with a heart healthy diet with increased physical activity Encouraged to continue treatment regimen BP Readings from Last 3 Encounters:  09/22/22 118/72  09/01/22 101/74  08/26/22 132/82

## 2022-09-22 NOTE — Progress Notes (Signed)
Established Patient Office Visit  Subjective:  Patient ID: Kathy Rios, female    DOB: May 05, 2001  Age: 22 y.o. MRN: [id number]  CC:  Chief Complaint  Patient presents with   Follow-up    1 month f/u for bp. Would like to discuss metformin.     HPI Kathy Rios is a 22 y.o. female with past medical history of essential hypertension, major depressive disorder, and morbid obesity presents for f/u of  chronic medical conditions. For the details of today's visit, please refer to the assessment and plan.     Past Medical History:  Diagnosis Date   ADD (attention deficit disorder)    ADHD (attention deficit hyperactivity disorder)    Anemia    Anxiety    Depression    Diabetes mellitus    Diabetes mellitus, type II ([name])    Encounter for menstrual regulation 01/25/2016   Hypertension    Irregular periods 10/25/2015   Obesity    Patient desires pregnancy 08/25/2022   PMS (premenstrual syndrome) 11/05/2015   Possible pregnancy 08/19/2022   PTSD (post-traumatic stress disorder)    Reactive hypoglycemia    Schizoaffective disorder, depressive type ([name])    Severe menstrual cramps 11/05/2015   Thalassemia trait    Thalassemia trait, alpha     Past Surgical History:  Procedure Laterality Date   ADENOIDECTOMY     TONSILLECTOMY     WISDOM TOOTH EXTRACTION  07/2016    Family History  Problem Relation Age of Onset   Seizures Mother    Arthritis Mother    Diabetes Mother    Hyperlipidemia Mother    Depression Mother    Hyperlipidemia Father    Hypertension Father    Gout Father    Dementia Father    Hypertension Sister    Mental illness Brother    Hyperlipidemia Brother    ADD / ADHD Brother    Depression Brother    Hypertension Maternal Grandmother    Cancer Maternal Grandmother    Hypertension Maternal Grandfather    Thyroid disease Maternal Grandfather    Cancer Maternal Grandfather    Prostate cancer Maternal Grandfather    Anemia Paternal Grandmother     Cancer Paternal Grandmother        cervical   Hypertension Paternal Grandfather    Heart disease Paternal Grandfather    Hypertension Other    Other Other        heart skips-maternal great grandma   Other Other        fibroids- maternal grandma and great grandma   Heart disease Other    Schizophrenia Maternal Aunt    Bipolar disorder Maternal Aunt    Alcohol abuse Maternal Uncle     Social History   Socioeconomic History   Marital status: Single    Spouse name: Not on file   Number of children: Not on file   Years of education: Not on file   Highest education level: Not on file  Occupational History   Not on file  Tobacco Use   Smoking status: Never   Smokeless tobacco: Never  Vaping Use   Vaping Use: Never used  Substance and Sexual Activity   Alcohol use: Yes    Comment: once per month will have 1 alcohol unit   Drug use: Not Currently    Types: Marijuana    Comment: previously every 3-6 months but hasn't smoked in a few years   Sexual activity: Not Currently    Birth control/protection: None  Other Topics Concern   Not on file  Social History Narrative   Not on file   Social Determinants of Health   Financial Resource Strain: Medium Risk (05/12/2022)   Overall Financial Resource Strain (CARDIA)    Difficulty of Paying Living Expenses: Somewhat hard  Food Insecurity: No Food Insecurity (05/12/2022)   Hunger Vital Sign    Worried About Running Out of Food in the Last Year: Never true    Ran Out of Food in the Last Year: Never true  Transportation Needs: No Transportation Needs (05/12/2022)   PRAPARE - Administrator, Civil Service (Medical): No    Lack of Transportation (Non-Medical): No  Physical Activity: Sufficiently Active (05/12/2022)   Exercise Vital Sign    Days of Exercise per Week: 3 days    Minutes of Exercise per Session: 60 min  Stress: No Stress Concern Present (05/12/2022)   [employer] of Occupational Health - Occupational  Stress Questionnaire    Feeling of Stress : Only a little  Social Connections: Moderately Isolated (05/12/2022)   Social Connection and Isolation Panel [NHANES]    Frequency of Communication with Friends and Family: Once a week    Frequency of Social Gatherings with Friends and Family: Once a week    Attends Religious Services: 1 to 4 times per year    Active Member of [employer] or Organizations: Yes    Attends Banker Meetings: 1 to 4 times per year    Marital Status: Never married  Intimate Partner Violence: Not At Risk (05/12/2022)   Humiliation, Afraid, Rape, and Kick questionnaire    Fear of Current or Ex-Partner: No    Emotionally Abused: No    Physically Abused: No    Sexually Abused: No    Outpatient Medications Prior to Visit  Medication Sig Dispense Refill   ACCU-CHEK GUIDE test strip USE TO CHECK BLOOD SUGAR DAILY     Accu-Chek Softclix Lancets lancets daily.     acetaminophen (TYLENOL) 500 MG tablet Take 1,000 mg by mouth every 6 (six) hours as needed for mild pain or headache.     b complex vitamins capsule Take 1 capsule by mouth.     cloNIDine HCl (KAPVAY) 0.1 MG TB12 ER tablet Take 0.1 mg by mouth at bedtime.     FLUoxetine (PROZAC) 20 MG capsule Take 20 mg by mouth daily.     NIFEdipine (PROCARDIA-XL/NIFEDICAL-XL) 30 MG 24 hr tablet Take 1 tablet (30 mg total) by mouth daily. 30 tablet 2   prenatal vitamin w/FE, FA (PRENATAL 1 + 1) 27-1 MG TABS tablet Take 1 tablet by mouth daily at 12 noon. 30 tablet 12   UNABLE TO FIND Omega 3-takes 1 tab daily     valACYclovir (VALTREX) 1000 MG tablet Take 2 tablets by mouth 2 (two) times daily as needed (cold sores).   1   ARIPiprazole (ABILIFY) 15 MG tablet Take 1 tablet (15 mg total) by mouth daily. 30 tablet 1   fluconazole (DIFLUCAN) 150 MG tablet Take 1 now and 1 in 3 days 2 tablet 1   metroNIDAZOLE (FLAGYL) 500 MG tablet Take 1 tablet (500 mg total) by mouth 2 (two) times daily. 14 tablet 0   No  facility-administered medications prior to visit.    Allergies  Allergen Reactions   Selenium Sulfide Rash   Banana Other (See Comments)    headache   Motrin [Ibuprofen] Hives and Itching   Sulfa Antibiotics Rash    ROS Review  of Systems  Constitutional:  Negative for chills and fever.  Eyes:  Negative for visual disturbance.  Respiratory:  Negative for chest tightness and shortness of breath.   Neurological:  Negative for dizziness and headaches.      Objective:    Physical Exam HENT:     Head: Normocephalic.     Mouth/Throat:     Mouth: Mucous membranes are moist.  Cardiovascular:     Rate and Rhythm: Normal rate.     Heart sounds: Normal heart sounds.  Pulmonary:     Effort: Pulmonary effort is normal.     Breath sounds: Normal breath sounds.  Neurological:     Mental Status: She is alert.     BP 118/72   Pulse (!) 110   Ht 5\' 5"  (1.651 m)   Wt (!) 307 lb 1.9 oz (139.3 kg)   LMP 08/18/2022   SpO2 98%   BMI 51.11 kg/m  Wt Readings from Last 3 Encounters:  09/22/22 (!) 307 lb 1.9 oz (139.3 kg)  09/09/22 (!) 307 lb (139.3 kg)  09/01/22 (!) 305 lb (138.3 kg)    Lab Results  Component Value Date   TSH 2.070 09/23/2022   Lab Results  Component Value Date   WBC 6.6 09/23/2022   HGB 10.9 (L) 09/23/2022   HCT 35.4 09/23/2022   MCV 67 (L) 09/23/2022   PLT 424 09/23/2022   Lab Results  Component Value Date   NA 140 09/23/2022   K 4.7 09/23/2022   CO2 22 09/23/2022   GLUCOSE 93 09/23/2022   BUN 10 09/23/2022   CREATININE 0.59 09/23/2022   BILITOT 0.2 09/23/2022   ALKPHOS 142 (H) 09/23/2022   AST 16 09/23/2022   ALT 19 09/23/2022   PROT 7.4 09/23/2022   ALBUMIN 4.3 09/23/2022   CALCIUM 9.7 09/23/2022   ANIONGAP 8 09/23/2021   EGFR 130 09/23/2022   Lab Results  Component Value Date   CHOL 218 (H) 09/23/2022   Lab Results  Component Value Date   HDL 70 09/23/2022   Lab Results  Component Value Date   LDLCALC 134 (H) 09/23/2022   Lab  Results  Component Value Date   TRIG 78 09/23/2022   Lab Results  Component Value Date   CHOLHDL 3.1 09/23/2022   Lab Results  Component Value Date   HGBA1C 6.0 (H) 09/23/2022      Assessment & Plan:  Essential hypertension Assessment & Plan: Controlled She takes Procardia 30 mg daily Denies headaches, dizziness, chest pain, palpitation She reports adherence with treatment regimen and compliance with a heart healthy diet with increased physical activity Encouraged to continue treatment regimen BP Readings from Last 3 Encounters:  09/22/22 118/72  09/01/22 101/74  08/26/22 132/82      Prediabetes Assessment & Plan: The patient is prediabetic  hemoglobin A1c on 06/30/2022 She reports taking metformin, which was discontinued by Dr. Court Joy at her last visit on 08/26/2022 She denies polyuria, polyphagia, polydipsia Will assess hemoglobin A1c today Metformin will only be reinstated if her hemoglobin A1c is 6.4 or higher Lab Results  Component Value Date   HGBA1C 5.8 (H) 06/30/2022      IFG (impaired fasting glucose) -     Hemoglobin A1c  Vitamin D deficiency -     VITAMIN D 25 Hydroxy (Vit-D Deficiency, Fractures)  Other specified hypothyroidism -     TSH + free T4  Other hyperlipidemia -     Lipid panel -     CMP14+EGFR -  CBC with Differential/Platelet    Follow-up: Return in about 3 months (around 12/21/2022).   Alvira Monday, FNP

## 2022-09-22 NOTE — Patient Instructions (Addendum)
I appreciate the opportunity to provide care to you today!    Follow up:  3 months  Labs: please stop by the lab during the week to get your blood drawn (CBC, CMP, TSH, Lipid profile, HgA1c, Vit D)   Please continue to a heart-healthy diet and increase your physical activities. Try to exercise for 65mns at least five times a week.   Physical activity helps: Lower your blood glucose, improve your heart health, lower your blood pressure and cholesterol, burn calories to help manage her weight, gave you energy, lower stress, and improve his sleep.  The American diabetes Association (ADA) recommends being active for 2-1/2 hours (150 minutes) or more week.  Exercise for 30 minutes, 5 days a week (150 minutes total)    It was a pleasure to see you and I look forward to continuing to work together on your health and well-being. Please do not hesitate to call the office if you need care or have questions about your care.   Have a wonderful day and week. With Gratitude, GAlvira MondayMSN, FNP-BC

## 2022-09-22 NOTE — Assessment & Plan Note (Addendum)
The patient is prediabetic prior her hemoglobin A1c on 06/30/2022 She reports taking metformin, which was discontinued by Dr. Court Joy at her last visit on 08/26/2022 She denies polyuria, polyphagia, polydipsia Will assess hemoglobin A1c today Metformin will only be reinstated if her hemoglobin A1c is 6.4 or higher Lab Results  Component Value Date   HGBA1C 5.8 (H) 06/30/2022

## 2022-09-24 ENCOUNTER — Other Ambulatory Visit (HOSPITAL_COMMUNITY): Payer: Self-pay | Admitting: Psychiatry

## 2022-09-24 DIAGNOSIS — F2089 Other schizophrenia: Secondary | ICD-10-CM

## 2022-09-24 LAB — CBC WITH DIFFERENTIAL/PLATELET
Basophils Absolute: 0 10*3/uL (ref 0.0–0.2)
Basos: 1 %
EOS (ABSOLUTE): 0.1 10*3/uL (ref 0.0–0.4)
Eos: 1 %
Hematocrit: 35.4 % (ref 34.0–46.6)
Hemoglobin: 10.9 g/dL — ABNORMAL LOW (ref 11.1–15.9)
Immature Grans (Abs): 0 10*3/uL (ref 0.0–0.1)
Immature Granulocytes: 0 %
Lymphocytes Absolute: 2.3 10*3/uL (ref 0.7–3.1)
Lymphs: 35 %
MCH: 20.5 pg — ABNORMAL LOW (ref 26.6–33.0)
MCHC: 30.8 g/dL — ABNORMAL LOW (ref 31.5–35.7)
MCV: 67 fL — ABNORMAL LOW (ref 79–97)
Monocytes Absolute: 0.6 10*3/uL (ref 0.1–0.9)
Monocytes: 9 %
Neutrophils Absolute: 3.6 10*3/uL (ref 1.4–7.0)
Neutrophils: 54 %
Platelets: 424 10*3/uL (ref 150–450)
RBC: 5.31 x10E6/uL — ABNORMAL HIGH (ref 3.77–5.28)
RDW: 15 % (ref 11.7–15.4)
WBC: 6.6 10*3/uL (ref 3.4–10.8)

## 2022-09-24 LAB — CMP14+EGFR
ALT: 19 IU/L (ref 0–32)
AST: 16 IU/L (ref 0–40)
Albumin/Globulin Ratio: 1.4 (ref 1.2–2.2)
Albumin: 4.3 g/dL (ref 4.0–5.0)
Alkaline Phosphatase: 142 IU/L — ABNORMAL HIGH (ref 44–121)
BUN/Creatinine Ratio: 17 (ref 9–23)
BUN: 10 mg/dL (ref 6–20)
Bilirubin Total: 0.2 mg/dL (ref 0.0–1.2)
CO2: 22 mmol/L (ref 20–29)
Calcium: 9.7 mg/dL (ref 8.7–10.2)
Chloride: 104 mmol/L (ref 96–106)
Creatinine, Ser: 0.59 mg/dL (ref 0.57–1.00)
Globulin, Total: 3.1 g/dL (ref 1.5–4.5)
Glucose: 93 mg/dL (ref 70–99)
Potassium: 4.7 mmol/L (ref 3.5–5.2)
Sodium: 140 mmol/L (ref 134–144)
Total Protein: 7.4 g/dL (ref 6.0–8.5)
eGFR: 130 mL/min/{1.73_m2} (ref >59)

## 2022-09-24 LAB — TSH+FREE T4
Free T4: 1.01 ng/dL (ref 0.82–1.77)
TSH: 2.07 u[IU]/mL (ref 0.450–4.500)

## 2022-09-24 LAB — LIPID PANEL
Chol/HDL Ratio: 3.1 ratio (ref 0.0–4.4)
Cholesterol, Total: 218 mg/dL — ABNORMAL HIGH (ref 100–199)
HDL: 70 mg/dL (ref >39)
LDL Chol Calc (NIH): 134 mg/dL — ABNORMAL HIGH (ref 0–99)
Triglycerides: 78 mg/dL (ref 0–149)
VLDL Cholesterol Cal: 14 mg/dL (ref 5–40)

## 2022-09-24 LAB — HEMOGLOBIN A1C
Est. average glucose Bld gHb Est-mCnc: 126 mg/dL
Hgb A1c MFr Bld: 6 % — ABNORMAL HIGH (ref 4.8–5.6)

## 2022-09-24 LAB — VITAMIN D 25 HYDROXY (VIT D DEFICIENCY, FRACTURES): Vit D, 25-Hydroxy: 20 ng/mL — ABNORMAL LOW (ref 30.0–100.0)

## 2022-09-24 MED ORDER — ARIPIPRAZOLE 20 MG PO TABS: 20.0000 mg | ORAL_TABLET | Freq: Every day | ORAL | 1 refills | Status: DC

## 2022-09-24 NOTE — Progress Notes (Signed)
Patient with worsening auditory and olfactory hallucinations, previously have responded well to abilify and titrations of abilify; exhibited by improving visual hallucinations. Will titrate to 20mg  today in effort to further treat positive symptoms of schizophrenia.

## 2022-09-29 ENCOUNTER — Ambulatory Visit: Payer: Medicaid Other | Admitting: Neurology

## 2022-09-29 ENCOUNTER — Ambulatory Visit: Payer: Medicaid Other | Admitting: Family Medicine

## 2022-09-29 ENCOUNTER — Other Ambulatory Visit: Payer: Self-pay | Admitting: Family Medicine

## 2022-09-29 DIAGNOSIS — R404 Transient alteration of awareness: Secondary | ICD-10-CM

## 2022-09-29 DIAGNOSIS — R4182 Altered mental status, unspecified: Secondary | ICD-10-CM

## 2022-09-29 DIAGNOSIS — E559 Vitamin D deficiency, unspecified: Secondary | ICD-10-CM

## 2022-09-29 MED ORDER — VITAMIN D (ERGOCALCIFEROL) 1.25 MG (50000 UNIT) PO CAPS: 50000.0000 [IU] | ORAL_CAPSULE | ORAL | 1 refills | Status: DC

## 2022-09-29 NOTE — Procedures (Signed)
    History:  22 year old woman with altered sensorium   EEG classification: Awake and drowsy  Description of the recording: The background rhythms of this recording consists of a fairly well modulated medium amplitude alpha rhythm of 9 Hz that is reactive to eye opening and closure. Present in the anterior head region is a 15-20 Hz beta activity. Photic stimulation was performed, did not show any abnormalities. Hyperventilation was also performed, did not show any abnormalities. Drowsiness was manifested by background fragmentation. No abnormal epileptiform discharges seen during this recording. There was no focal slowing. There were no electrographic seizure identified.   Abnormality: None   Impression: This is a normal EEG recorded while drowsy and awake. No evidence of interictal epileptiform discharges. Normal EEGs, however, do not rule out epilepsy.    Alric Ran, MD [facility]

## 2022-09-30 ENCOUNTER — Telehealth (INDEPENDENT_AMBULATORY_CARE_PROVIDER_SITE_OTHER): Payer: No Typology Code available for payment source | Admitting: Psychiatry

## 2022-09-30 ENCOUNTER — Encounter (HOSPITAL_COMMUNITY): Payer: Self-pay | Admitting: Psychiatry

## 2022-09-30 DIAGNOSIS — Z79899 Other long term (current) drug therapy: Secondary | ICD-10-CM

## 2022-09-30 DIAGNOSIS — F3341 Major depressive disorder, recurrent, in partial remission: Secondary | ICD-10-CM

## 2022-09-30 DIAGNOSIS — F4310 Post-traumatic stress disorder, unspecified: Secondary | ICD-10-CM

## 2022-09-30 DIAGNOSIS — F2089 Other schizophrenia: Secondary | ICD-10-CM

## 2022-09-30 DIAGNOSIS — F900 Attention-deficit hyperactivity disorder, predominantly inattentive type: Secondary | ICD-10-CM

## 2022-09-30 DIAGNOSIS — F411 Generalized anxiety disorder: Secondary | ICD-10-CM

## 2022-09-30 MED ORDER — CLONIDINE HCL ER 0.1 MG PO TB12: 0.1000 mg | ORAL_TABLET | Freq: Every day | ORAL | 1 refills | Status: DC

## 2022-09-30 MED ORDER — FLUOXETINE HCL 20 MG PO CAPS: 20.0000 mg | ORAL_CAPSULE | Freq: Every day | ORAL | 1 refills | Status: DC
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
## 2022-09-30 NOTE — Patient Instructions (Signed)
Discontinue the over-the-counter mental endurance medication as it contains herbs that will interact with her other medications.  I will have my front desk contact your therapist front desk to try and have records sent over.

## 2022-09-30 NOTE — Addendum Note (Signed)
Addended by: Debby Bud on: 09/30/2022 11:17 AM   Modules accepted: Orders

## 2022-10-14 ENCOUNTER — Encounter (HOSPITAL_COMMUNITY): Payer: Self-pay | Admitting: Psychiatry

## 2022-10-14 ENCOUNTER — Telehealth (INDEPENDENT_AMBULATORY_CARE_PROVIDER_SITE_OTHER): Payer: No Typology Code available for payment source | Admitting: Psychiatry

## 2022-10-14 DIAGNOSIS — F411 Generalized anxiety disorder: Secondary | ICD-10-CM | POA: Diagnosis not present

## 2022-10-14 DIAGNOSIS — F4310 Post-traumatic stress disorder, unspecified: Secondary | ICD-10-CM | POA: Diagnosis not present

## 2022-10-14 DIAGNOSIS — Z79899 Other long term (current) drug therapy: Secondary | ICD-10-CM | POA: Diagnosis not present

## 2022-10-14 DIAGNOSIS — F250 Schizoaffective disorder, bipolar type: Secondary | ICD-10-CM | POA: Diagnosis not present

## 2022-10-14 NOTE — Patient Instructions (Signed)
We didn't make any medication changes today. Keep up the good work with preparing for school and send me the accommodation forms to fill out.
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
## 2022-10-21 ENCOUNTER — Encounter: Payer: Medicaid Other | Attending: Family Medicine | Admitting: Nutrition

## 2022-10-21 ENCOUNTER — Encounter: Payer: Self-pay | Admitting: Nutrition

## 2022-10-21 VITALS — Ht 65.0 in | Wt 325.0 lb

## 2022-10-21 DIAGNOSIS — Z6841 Body Mass Index (BMI) 40.0 and over, adult: Secondary | ICD-10-CM | POA: Diagnosis not present

## 2022-10-21 DIAGNOSIS — T50905A Adverse effect of unspecified drugs, medicaments and biological substances, initial encounter: Secondary | ICD-10-CM | POA: Insufficient documentation

## 2022-10-21 DIAGNOSIS — I10 Essential (primary) hypertension: Secondary | ICD-10-CM | POA: Diagnosis not present

## 2022-10-21 DIAGNOSIS — R7303 Prediabetes: Secondary | ICD-10-CM | POA: Insufficient documentation

## 2022-10-21 DIAGNOSIS — E782 Mixed hyperlipidemia: Secondary | ICD-10-CM | POA: Insufficient documentation

## 2022-10-21 DIAGNOSIS — Z713 Dietary counseling and surveillance: Secondary | ICD-10-CM | POA: Insufficient documentation

## 2022-10-21 DIAGNOSIS — R635 Abnormal weight gain: Secondary | ICD-10-CM | POA: Diagnosis present

## 2022-10-21 NOTE — Patient Instructions (Signed)
Goals  Do water aerobics 3 times per week Walk 30 minutes a day on days you don't go to the [facility]. Cut out sodas and sweets Avoid fried and processed foods Drink 6 bottles per day of water Increase fruits, vegetables and whole grains Cut out fried foods Lose 1-2 lbs per week.

## 2022-10-21 NOTE — Progress Notes (Signed)
Medical Nutrition Therapy  Appointment Start time:  1300 Appointment End time:  1330  Primary concerns today: Obesity, PreDM  Referral diagnosis:R73.03, E66.01 Preferred learning style: no preference  Learning readiness:   Ready    NUTRITION ASSESSMENT Obesity, Pre Dm Follow up [facility] since last visit: Was on Prozac,and has been stopped due to 25 lb weight gain  and making her eat so much from the medication. She has been off the Prozac for a week and can already tell a difference and feeling better about not being so hungry and craving sugar and fat all the time. She is only on Ambilify for now.  Had been on Prozac since 2020 and has gained 50 lbs in that time frame. No longer on the Vyvanse also.    She has noticed that it is a lot harder to move and get up and down since gaining the weight. She just started going to water aerobics at the [facility] this past week and loves it.  She is willing to work on walking more too when she doesn't go to water aerobics.   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 Her doctor told her to avoid caffeine and sugar.   She has been trying recently to fix more plant based foods. She made a 15 bean soup and eating more fruit and vegetables.  She is seeing a therapist once a week and her psychiatrist every 2 weeks.  Anthropometrics  Wt Readings from Last 3 Encounters:  09/22/22 (!) 307 lb 1.9 oz (139.3 kg)  09/09/22 (!) 307 lb (139.3 kg)  09/01/22 (!) 305 lb (138.3 kg)   Ht Readings from Last 3 Encounters:  09/22/22 '5\' 5"'$  (1.651 m)  09/09/22 '5\' 5"'$  (1.651 m)  09/01/22 '5\' 5"'$  (1.651 m)   There is no height or weight on file to calculate BMI. '@BMIFA'$ @ Facility age limit for growth %iles is 20 years. Facility age limit for growth %iles is 20 years.   Clinical Medical Hx:  See chart Medications: See chart Labs:  Lab Results  Component Value Date   HGBA1C 6.0 (H) 09/23/2022      Latest Ref Rng & Units 09/23/2022    9:32 AM  06/30/2022   10:39 AM 09/23/2021    6:24 PM  CMP  Glucose 70 - 99 mg/dL 93  98  106   BUN 6 - 20 mg/dL '10  11  11   '$ Creatinine 0.57 - 1.00 mg/dL 0.59  0.70  0.70   Sodium 134 - 144 mmol/L 140  136  135   Potassium 3.5 - 5.2 mmol/L 4.7  5.0  4.0   Chloride 96 - 106 mmol/L 104  104  104   CO2 20 - 29 mmol/L '22  21  23   '$ Calcium 8.7 - 10.2 mg/dL 9.7  9.1  9.3   Total Protein 6.0 - 8.5 g/dL 7.4  7.5  8.1   Total Bilirubin 0.0 - 1.2 mg/dL 0.2  <0.2  0.1   Alkaline Phos 44 - 121 IU/L 142  117  75   AST 0 - 40 IU/L '16  12  17   '$ ALT 0 - 32 IU/L '19  18  24    '$ Lipid Panel     Component Value Date/Time   CHOL 218 (H) 09/23/2022 0932   TRIG 78 09/23/2022 0932   HDL 70 09/23/2022 0932   CHOLHDL 3.1 09/23/2022 0932   LDLCALC 134 (H) 09/23/2022 0932   LABVLDL 14 09/23/2022 0932  Notable Signs/Symptoms: Increased thirsty, iincreased appetite.  Lifestyle & Dietary Hx Lives with her parents.  She cooks and eats what they provide.Eats out some and cooks for herself also. She admits she eats meals and snacks and doesn't get full.   Estimated daily fluid intake: 64. oz Supplements: prenatal vitamins Sleep: 8 hrs  Stress / self-care: living in house with parents and family members Current average weekly physical activity:  Has been exercisidng 3 times per week at the [facility] for an hour; cardio, strengthening   24-Hr Dietary Recall B)Oatmeal old fashion and packets 4 slenda L) 1 pm Fried pork tenderloin, fried onions, [place]  Estimated Energy Needs Calories: 1200 Carbohydrate: 135g Protein: 90g Fat: 33g   NUTRITION DIAGNOSIS  NB-1.1 Food and nutrition-related knowledge deficit As related to Obesity and predm.  As evidenced by A1C 5.8% and diet recall.   NUTRITION INTERVENTION  Nutrition education (E-1) on the following topics:  Lifestyle Medicine  - Whole Food, Plant Predominant Nutrition is highly recommended: Eat Plenty of vegetables, Mushrooms, fruits, Legumes, Whole Grains,  Nuts, seeds in lieu of processed meats, processed snacks/pastries red meat, poultry, eggs.    -It is better to avoid simple carbohydrates including: Cakes, Sweet Desserts, Ice Cream, Soda (diet and regular), Sweet Tea, Candies, Chips, Cookies, Store Bought Juices, Alcohol in Excess of  1-2 drinks a day, Lemonade,  Artificial Sweeteners, Doughnuts, Coffee Creamers, "Sugar-free" Products, etc, etc.  This is not a complete list.....  Exercise: If you are able: 30 -60 minutes a day ,4 days a week, or 150 minutes a week.  The longer the better.  Combine stretch, strength, and aerobic activities.  If you were told in the past that you have high risk for cardiovascular diseases, you may seek evaluation by your heart doctor prior to initiating moderate to intense exercise programs.   Handouts Provided Include  Lifestyle Medicine  Learning Style & Readiness for Change Teaching method utilized: Visual & Auditory  Demonstrated degree of understanding via: Teach Back  Barriers to learning/adherence to lifestyle change: none  Goals Established by Pt Goals  Do water aerobics 3 times per week Walk 30 minutes a day on days you don't go to the [facility]. Cut out sodas and sweets Avoid fried and processed foods Drink 6 bottles per day of water Increase fruits, vegetables and whole grains Cut out fried foods Lose 1-2 lbs per week.   MONITORING & EVALUATION Dietary intake, weekly physical activity, and weight  in 1 month.  Next Steps  Patient is to work on meal prepping and meal planning. Watch FORKS OVER KNIVES or We are what we eat.Marland Kitchen

## 2022-10-28 ENCOUNTER — Encounter (HOSPITAL_COMMUNITY): Payer: Self-pay | Admitting: Psychiatry

## 2022-10-28 ENCOUNTER — Encounter: Payer: Self-pay | Admitting: Internal Medicine

## 2022-10-28 ENCOUNTER — Telehealth (INDEPENDENT_AMBULATORY_CARE_PROVIDER_SITE_OTHER): Payer: No Typology Code available for payment source | Admitting: Psychiatry

## 2022-10-28 ENCOUNTER — Ambulatory Visit (INDEPENDENT_AMBULATORY_CARE_PROVIDER_SITE_OTHER): Payer: Medicaid Other | Admitting: Internal Medicine

## 2022-10-28 DIAGNOSIS — F411 Generalized anxiety disorder: Secondary | ICD-10-CM | POA: Diagnosis not present

## 2022-10-28 DIAGNOSIS — Z79899 Other long term (current) drug therapy: Secondary | ICD-10-CM

## 2022-10-28 DIAGNOSIS — F4310 Post-traumatic stress disorder, unspecified: Secondary | ICD-10-CM | POA: Diagnosis not present

## 2022-10-28 DIAGNOSIS — F2089 Other schizophrenia: Secondary | ICD-10-CM

## 2022-10-28 DIAGNOSIS — R7303 Prediabetes: Secondary | ICD-10-CM | POA: Diagnosis not present

## 2022-10-28 DIAGNOSIS — F250 Schizoaffective disorder, bipolar type: Secondary | ICD-10-CM

## 2022-10-28 DIAGNOSIS — F900 Attention-deficit hyperactivity disorder, predominantly inattentive type: Secondary | ICD-10-CM

## 2022-10-28 LAB — POCT GLYCOSYLATED HEMOGLOBIN (HGB A1C): HbA1c, POC (prediabetic range): 5.8 % (ref 5.7–6.4)

## 2022-10-28 MED ORDER — ARIPIPRAZOLE 30 MG PO TABS: 30.0000 mg | ORAL_TABLET | Freq: Every day | ORAL | 2 refills | Status: DC

## 2022-10-28 NOTE — Progress Notes (Signed)
   HPI:Kathy Rios is a 21 y.o. female who presents for evaluation of Obesity. For the details of today's visit, please refer to the assessment and plan.  Physical Exam: Vitals:   10/28/22 1428  BP: (!) 135/91  Pulse: 98  Resp: 16  SpO2: 96%  Weight: (!) 327 lb (148.3 kg)  Height: 5\' 5"  (1.651 m)     Physical Exam Constitutional:      Appearance: She is well-developed. She is morbidly obese.  Cardiovascular:     Rate and Rhythm: Normal rate and regular rhythm.     Heart sounds: No murmur heard. Psychiatric:        Mood and Affect: Mood normal.        Behavior: Behavior normal.      Assessment & Plan:   Kathy Rios was seen today for obesity.  Morbid obesity ([place]) Assessment & Plan: Wt Readings from Last 3 Encounters:  10/28/22 (!) 327 lb (148.3 kg)  10/21/22 (!) 325 lb (147.4 kg)  09/22/22 (!) 307 lb 1.9 oz (139.3 kg)  Hemoglobin A1c is 5.8 today  Since her last visit patient has started seeing registered dietitian.  She is participating in water aerobics three times a week. She is not established with PREP program. Will send new referral.  She is not diabetic and does not qualify for GLP-1's which would benefit her for weight loss.  Patient request information on bariatric surgery and information provided on her AVS.  She will contact and discuss if she is a candidate.    Orders: -     Amb Referral To Provider Referral Exercise Program (P.R.E.P)      Lorene Dy, MD

## 2022-10-28 NOTE — Assessment & Plan Note (Signed)
Wt Readings from Last 3 Encounters:  10/28/22 (!) 327 lb (148.3 kg)  10/21/22 (!) 325 lb (147.4 kg)  09/22/22 (!) 307 lb 1.9 oz (139.3 kg)  Hemoglobin A1c is 5.8 today  Since her last visit patient has started seeing registered dietitian.  She is participating in water aerobics three times a week. She is not established with PREP program. Will send new referral.  She is not diabetic and does not qualify for GLP-1's which would benefit her for weight loss.  Patient request information on bariatric surgery and information provided on her AVS.  She will contact and discuss if she is a candidate.

## 2022-10-28 NOTE — Patient Instructions (Signed)
We increased the Abilify to 30 mg once daily today.

## 2022-10-28 NOTE — Patient Instructions (Addendum)
Thank you, Ms.Martie Lee for allowing [place] to provide your care today.   I have placed another order for the [facility] to contact you.   Below is information on Bariatric Surgery :   ____________________________________________________________________________________ Bariatric Surgery You have so much to gain by losing weight.  You may have already tried every diet and exercise plan imaginable.  And, you may have sought advice from your family physician, too.   Sometimes, in spite of such diligent efforts, you may not be able to achieve long-term results by yourself.  In cases of severe obesity, bariatric or weight loss surgery is a proven method of achieving long-term weight control.  Our Services Our bariatric surgery programs offer our patients new hope and long-term weight-loss solution.  Since introducing our services in 2003, we have conducted more than 2,400 successful procedures.  Our program is designated as a Programmer, multimedia by the Metabolic and Bariatric Surgery Accreditation and Quality Improvement Program (MBSAQIP), a IT trainer that sets rigorous patient safety and outcome standards.  Our program is also designated as a Ecologist by [employer].   Our exceptional weight-loss surgery team specializes in diagnosis, treatment, follow-up care, and ongoing support for our patients with severe weight loss challenges.  We currently offer laparoscopic sleeve gastrectomy, gastric bypass, and adjustable gastric band (LAP-BAND).    Attend our [place] Choosing to undergo a bariatric procedure is a big decision, and one that should not be taken lightly.  You now have two options in how you learn about weight-loss surgery - in person or online.  Our objective is to ensure you have all of the information that you need to evaluate the advantages and obligations of this life changing procedure.  Please note that you are not alone in this process, and  our experienced team is ready to assist and answer all of your questions.  There are several ways to register for a seminar (either on-line or in person):  Call [phone] Go on-line to [facility] and register for either type of seminar.  [email or web address]     Tamsen Snider, M.D.
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
## 2022-10-28 NOTE — Addendum Note (Signed)
Addended by: Eual Fines on: 10/28/2022 03:07 PM   Modules accepted: Orders

## 2022-10-29 ENCOUNTER — Encounter: Payer: Self-pay | Admitting: Internal Medicine

## 2022-11-06 ENCOUNTER — Other Ambulatory Visit: Payer: Self-pay | Admitting: Internal Medicine

## 2022-11-06 DIAGNOSIS — Z6841 Body Mass Index (BMI) 40.0 and over, adult: Secondary | ICD-10-CM

## 2022-11-11 ENCOUNTER — Encounter: Payer: Self-pay | Admitting: Family Medicine

## 2022-11-11 ENCOUNTER — Other Ambulatory Visit: Payer: Self-pay | Admitting: Family Medicine

## 2022-11-11 ENCOUNTER — Encounter (HOSPITAL_COMMUNITY): Payer: Self-pay | Admitting: Psychiatry

## 2022-11-11 ENCOUNTER — Telehealth (INDEPENDENT_AMBULATORY_CARE_PROVIDER_SITE_OTHER): Payer: No Typology Code available for payment source | Admitting: Psychiatry

## 2022-11-11 DIAGNOSIS — F4310 Post-traumatic stress disorder, unspecified: Secondary | ICD-10-CM

## 2022-11-11 DIAGNOSIS — N943 Premenstrual tension syndrome: Secondary | ICD-10-CM | POA: Diagnosis not present

## 2022-11-11 DIAGNOSIS — F250 Schizoaffective disorder, bipolar type: Secondary | ICD-10-CM | POA: Diagnosis not present

## 2022-11-11 DIAGNOSIS — Z79899 Other long term (current) drug therapy: Secondary | ICD-10-CM

## 2022-11-11 DIAGNOSIS — F411 Generalized anxiety disorder: Secondary | ICD-10-CM | POA: Diagnosis not present

## 2022-11-11 DIAGNOSIS — F900 Attention-deficit hyperactivity disorder, predominantly inattentive type: Secondary | ICD-10-CM

## 2022-11-11 NOTE — Patient Instructions (Signed)
We didn't make any medication changes today. Try to track what you are doing hour by hour to get a sense of where you could work on homework assignments in smaller spurts throughout the day to stay on track.
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
## 2022-11-13 ENCOUNTER — Ambulatory Visit (INDEPENDENT_AMBULATORY_CARE_PROVIDER_SITE_OTHER): Payer: Medicaid Other | Admitting: Family Medicine

## 2022-11-13 VITALS — BP 148/80 | HR 104 | Ht 65.0 in | Wt 331.0 lb

## 2022-11-13 DIAGNOSIS — I10 Essential (primary) hypertension: Secondary | ICD-10-CM

## 2022-11-13 MED ORDER — NIFEDIPINE ER OSMOTIC RELEASE 60 MG PO TB24: 60.0000 mg | ORAL_TABLET | Freq: Every day | ORAL | 1 refills | Status: DC

## 2022-11-13 NOTE — Progress Notes (Signed)
Established Patient Office Visit  Subjective:  Patient ID: Kathy Rios, female    DOB: 2001-02-20  Age: 22 y.o. MRN: [id number]  CC:  Chief Complaint  Patient presents with   Obesity    Kathy Rios would like to discuss recent weight gain from taking ariprazole, would like to discuss wegovy medication as an option per psychiatrist.    HPI Kathy Rios is a 22 y.o. female with past medical history obesity presents to discuss weight loss options. For the details of today's visit, please refer to the assessment and plan.         Wt Readings from Last 3 Encounters:  11/13/22 (!) 331 lb 0.6 oz (150.2 kg)  10/28/22 (!) 327 lb (148.3 kg)  10/21/22 (!) 325 lb (147.4 kg)     Past Medical History:  Diagnosis Date   ADD (attention deficit disorder)    ADHD (attention deficit hyperactivity disorder)    Anemia    Anxiety    Chronic constipation 03/08/2018   Depression    Diabetes mellitus    Diabetes mellitus, type II ([place])    Encounter for menstrual regulation 01/25/2016   Hypertension    Irregular periods 10/25/2015   Major depressive disorder 03/22/2018   Obesity    Patient desires pregnancy 08/25/2022   PMS (premenstrual syndrome) 11/05/2015   Possible pregnancy 08/19/2022   PTSD (post-traumatic stress disorder)    Reactive hypoglycemia    Schizoaffective disorder, depressive type ([place])    Severe menstrual cramps 11/05/2015   Thalassemia trait    Thalassemia trait, alpha     Past Surgical History:  Procedure Laterality Date   ADENOIDECTOMY     TONSILLECTOMY     WISDOM TOOTH EXTRACTION  07/2016    Family History  Problem Relation Age of Onset   Seizures Mother    Arthritis Mother    Diabetes Mother    Hyperlipidemia Mother    Depression Mother    Hyperlipidemia Father    Hypertension Father    Gout Father    Dementia Father    Hypertension Sister    Mental illness Brother    Hyperlipidemia Brother    ADD / ADHD Brother    Depression Brother     Hypertension Maternal Grandmother    Cancer Maternal Grandmother    Hypertension Maternal Grandfather    Thyroid disease Maternal Grandfather    Cancer Maternal Grandfather    Prostate cancer Maternal Grandfather    Anemia Paternal Grandmother    Cancer Paternal Grandmother        cervical   Hypertension Paternal Grandfather    Heart disease Paternal Grandfather    Hypertension Other    Other Other        heart skips-maternal great grandma   Other Other        fibroids- maternal grandma and great grandma   Heart disease Other    Schizophrenia Maternal Aunt    Bipolar disorder Maternal Aunt    Alcohol abuse Maternal Uncle     Social History   Socioeconomic History   Marital status: Single    Spouse name: Not on file   Number of children: Not on file   Years of education: Not on file   Highest education level: 12th grade  Occupational History   Not on file  Tobacco Use   Smoking status: Never   Smokeless tobacco: Never  Vaping Use   Vaping Use: Never used  Substance and Sexual Activity   Alcohol use: Yes  Comment: once per month will have 1 alcohol unit   Drug use: Not Currently    Types: Marijuana    Comment: previously every 3-6 months but hasn't smoked in a few years   Sexual activity: Not Currently    Birth control/protection: None  Other Topics Concern   Not on file  Social History Narrative   Not on file   Social Determinants of Health   Financial Resource Strain: Low Risk  (11/13/2022)   Overall Financial Resource Strain (CARDIA)    Difficulty of Paying Living Expenses: Not hard at all  Food Insecurity: No Food Insecurity (11/13/2022)   Hunger Vital Sign    Worried About Running Out of Food in the Last Year: Never true    [place] in the Last Year: Never true  Transportation Needs: No Transportation Needs (11/13/2022)   PRAPARE - Hydrologist (Medical): No    Lack of Transportation (Non-Medical): No  Physical  Activity: Sufficiently Active (11/13/2022)   Exercise Vital Sign    Days of Exercise per Week: 3 days    Minutes of Exercise per Session: 60 min  Stress: Stress Concern Present (11/13/2022)   [place]    Feeling of Stress : To some extent  Social Connections: Moderately Integrated (11/13/2022)   Social Connection and Isolation Panel [NHANES]    Frequency of Communication with Friends and Family: Three times a week    Frequency of Social Gatherings with Friends and Family: Once a week    Attends Religious Services: 1 to 4 times per year    Active Member of [employer] or Organizations: Yes    Attends Music therapist: More than 4 times per year    Marital Status: Never married  Intimate Partner Violence: Not At Risk (05/12/2022)   Humiliation, Afraid, Rape, and Kick questionnaire    Fear of Current or Ex-Partner: No    Emotionally Abused: No    Physically Abused: No    Sexually Abused: No    Outpatient Medications Prior to Visit  Medication Sig Dispense Refill   ACCU-CHEK GUIDE test strip USE TO CHECK BLOOD SUGAR DAILY     Accu-Chek Softclix Lancets lancets daily.     acetaminophen (TYLENOL) 500 MG tablet Take 1,000 mg by mouth every 6 (six) hours as needed for mild pain or headache.     ARIPiprazole (ABILIFY) 30 MG tablet Take 1 tablet (30 mg total) by mouth daily. 30 tablet 2   b complex vitamins capsule Take 1 capsule by mouth.     cloNIDine HCl (KAPVAY) 0.1 MG TB12 ER tablet Take 1 tablet (0.1 mg total) by mouth at bedtime. 90 tablet 1   prenatal vitamin w/FE, FA (PRENATAL 1 + 1) 27-1 MG TABS tablet Take 1 tablet by mouth daily at 12 noon. 30 tablet 12   UNABLE TO FIND Omega 3-takes 1 tab daily     valACYclovir (VALTREX) 1000 MG tablet Take 2 tablets by mouth 2 (two) times daily as needed (cold sores).   1   Vitamin D, Ergocalciferol, (DRISDOL) 1.25 MG (50000 UNIT) CAPS capsule Take 1 capsule (50,000 Units  total) by mouth every 7 (seven) days. 10 capsule 1   NIFEdipine (PROCARDIA-XL/NIFEDICAL-XL) 30 MG 24 hr tablet Take 1 tablet (30 mg total) by mouth daily. 30 tablet 2   No facility-administered medications prior to visit.    Allergies  Allergen Reactions   Selenium Sulfide Rash  Banana Other (See Comments)    headache   Motrin [Ibuprofen] Hives and Itching   Sulfa Antibiotics Rash    ROS Review of Systems  Constitutional:  Negative for chills and fever.  Eyes:  Negative for visual disturbance.  Respiratory:  Negative for chest tightness and shortness of breath.   Neurological:  Negative for dizziness and headaches.      Objective:    Physical Exam Constitutional:      Appearance: Kathy Rios is obese.  HENT:     Head: Normocephalic.     Mouth/Throat:     Mouth: Mucous membranes are moist.  Cardiovascular:     Rate and Rhythm: Normal rate.     Heart sounds: Normal heart sounds.  Pulmonary:     Effort: Pulmonary effort is normal.     Breath sounds: Normal breath sounds.  Neurological:     Mental Status: Kathy Rios is alert.     BP (!) 148/80 (BP Location: Left Arm)   Pulse (!) 104   Ht 5\' 5"  (1.651 m)   Wt (!) 331 lb 0.6 oz (150.2 kg)   SpO2 95%   BMI 55.09 kg/m  Wt Readings from Last 3 Encounters:  11/13/22 (!) 331 lb 0.6 oz (150.2 kg)  10/28/22 (!) 327 lb (148.3 kg)  10/21/22 (!) 325 lb (147.4 kg)    Lab Results  Component Value Date   TSH 2.070 09/23/2022   Lab Results  Component Value Date   WBC 6.6 09/23/2022   HGB 10.9 (L) 09/23/2022   HCT 35.4 09/23/2022   MCV 67 (L) 09/23/2022   PLT 424 09/23/2022   Lab Results  Component Value Date   NA 140 09/23/2022   K 4.7 09/23/2022   CO2 22 09/23/2022   GLUCOSE 93 09/23/2022   BUN 10 09/23/2022   CREATININE 0.59 09/23/2022   BILITOT 0.2 09/23/2022   ALKPHOS 142 (H) 09/23/2022   AST 16 09/23/2022   ALT 19 09/23/2022   PROT 7.4 09/23/2022   ALBUMIN 4.3 09/23/2022   CALCIUM 9.7 09/23/2022   ANIONGAP 8  09/23/2021   EGFR 130 09/23/2022   Lab Results  Component Value Date   CHOL 218 (H) 09/23/2022   Lab Results  Component Value Date   HDL 70 09/23/2022   Lab Results  Component Value Date   LDLCALC 134 (H) 09/23/2022   Lab Results  Component Value Date   TRIG 78 09/23/2022   Lab Results  Component Value Date   CHOLHDL 3.1 09/23/2022   Lab Results  Component Value Date   HGBA1C 5.8 10/28/2022      Assessment & Plan:  Essential hypertension Assessment & Plan: Uncontrolled Kathy Rios takes Procardia 30 mg daily Patient is asymptomatic today Will increase her dose to Procardia 60 mg daily Low-sodium diet with increased physical activity encouraged Will f/u in a month BP Readings from Last 3 Encounters:  11/13/22 (!) 148/80  10/28/22 (!) 135/91  09/22/22 118/72       Orders: -     NIFEdipine ER Osmotic Release; Take 1 tablet (60 mg total) by mouth daily.  Dispense: 60 tablet; Refill: 1  Morbid obesity ([place]) Assessment & Plan: Kathy Rios reports increased weight gain on Abilify Kathy Rios has been implementing lifestyle changes with increased physical activity and a heart healthy diet The patient would like to be started on Wegovy today Informed the patient to call her insurance and verify if Mancel Parsons would be covered Patient verbalized understanding Encouraged to continue to implement lifestyle changes  Follow-up: Return in about 1 month (around 12/14/2022) for BP.   Alvira Monday, FNP

## 2022-11-13 NOTE — Patient Instructions (Addendum)
I appreciate the opportunity to provide care to you today!    Follow up:  1 month for BP  Labs: please stop by the lab today to get your blood drawn (CBC, CMP, TSH, Lipid profile, HgA1c, Vit D)   Please pick up your prescription at the pharmacy and start therapy (Procardia 60 mg) I recommend taking each dose at the same time each day, with or without flu High blood pressure often has no symptoms; it is important to take this medication as often as directed for optimal benefit In the first few days of therapy, you may experience dizziness/lightheadedness from your body adjusting to lower pressure (this is expected) I recommend checking your blood pressure daily and bringing your readings with you at your next visit I want your blood pressure to be less than 140/90 I recommend a low-sodium diet with increased physical activity I recommend increasing your fluid consumption by at least 64 ounces daily I recommend a daily sodium intake of less than 1500 mg    Please continue to a heart-healthy diet and increase your physical activities. Try to exercise for 13mins at least five days a week.      It was a pleasure to see you and I look forward to continuing to work together on your health and well-being. Please do not hesitate to call the office if you need care or have questions about your care.   Have a wonderful day and week. With Gratitude, Alvira Monday MSN, FNP-BC

## 2022-11-14 NOTE — Assessment & Plan Note (Signed)
She reports increased weight gain on Abilify She has been implementing lifestyle changes with increased physical activity and a heart healthy diet The patient would like to be started on Wegovy today Informed the patient to call her insurance and verify if Mancel Parsons would be covered Patient verbalized understanding Encouraged to continue to implement lifestyle changes

## 2022-11-14 NOTE — Assessment & Plan Note (Addendum)
Uncontrolled She takes Procardia 30 mg daily Patient is asymptomatic today Will increase her dose to Procardia 60 mg daily Low-sodium diet with increased physical activity encouraged Will f/u in a month BP Readings from Last 3 Encounters:  11/13/22 (!) 148/80  10/28/22 (!) 135/91  09/22/22 118/72

## 2022-11-20 ENCOUNTER — Ambulatory Visit: Payer: Medicaid Other | Admitting: Nutrition

## 2022-11-24 ENCOUNTER — Ambulatory Visit (INDEPENDENT_AMBULATORY_CARE_PROVIDER_SITE_OTHER): Payer: Medicaid Other | Admitting: Adult Health

## 2022-11-24 ENCOUNTER — Encounter: Payer: Self-pay | Admitting: Adult Health

## 2022-11-24 ENCOUNTER — Encounter: Payer: Self-pay | Admitting: Family Medicine

## 2022-11-24 ENCOUNTER — Telehealth (INDEPENDENT_AMBULATORY_CARE_PROVIDER_SITE_OTHER): Payer: No Typology Code available for payment source | Admitting: Psychiatry

## 2022-11-24 VITALS — BP 138/88 | HR 90 | Ht 65.0 in | Wt 337.0 lb

## 2022-11-24 DIAGNOSIS — F4310 Post-traumatic stress disorder, unspecified: Secondary | ICD-10-CM | POA: Diagnosis not present

## 2022-11-24 DIAGNOSIS — I10 Essential (primary) hypertension: Secondary | ICD-10-CM | POA: Diagnosis not present

## 2022-11-24 DIAGNOSIS — Z30011 Encounter for initial prescription of contraceptive pills: Secondary | ICD-10-CM | POA: Diagnosis not present

## 2022-11-24 DIAGNOSIS — F900 Attention-deficit hyperactivity disorder, predominantly inattentive type: Secondary | ICD-10-CM | POA: Diagnosis not present

## 2022-11-24 DIAGNOSIS — Z3202 Encounter for pregnancy test, result negative: Secondary | ICD-10-CM | POA: Diagnosis not present

## 2022-11-24 DIAGNOSIS — Z79899 Other long term (current) drug therapy: Secondary | ICD-10-CM | POA: Diagnosis not present

## 2022-11-24 DIAGNOSIS — F250 Schizoaffective disorder, bipolar type: Secondary | ICD-10-CM

## 2022-11-24 DIAGNOSIS — F411 Generalized anxiety disorder: Secondary | ICD-10-CM

## 2022-11-24 LAB — POCT URINE PREGNANCY: Preg Test, Ur: NEGATIVE

## 2022-11-24 MED ORDER — NORETHINDRONE 0.35 MG PO TABS: 1.0000 | ORAL_TABLET | Freq: Every day | ORAL | 11 refills | Status: DC

## 2022-11-24 MED ORDER — ATOMOXETINE HCL 40 MG PO CAPS: 40.0000 mg | ORAL_CAPSULE | Freq: Every day | ORAL | 2 refills | Status: DC
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
## 2022-11-24 NOTE — Patient Instructions (Signed)
Start Strattera 40mg  once daily on Friday 11/28/22. If you experience any recurrence of suicidal or homicidal ideation, paranoia, inability to sleep, excessively happy mood stop the medication immediately and let our office know. Do contact your academic advisor to see if you can get more time to turn in your missing assignments.

## 2022-11-24 NOTE — Progress Notes (Signed)
  Subjective:     Patient ID: Kathy Rios, female   DOB: [birth year], 22 y.o.   MRN: [id number]  HPI Kathy Rios is a 22 year old black female,single, G0P0, in to discuss birth control options, and she wants the pill. She has gained weight with Abilify, she sees Dr Adrian Blackwater.     Component Value Date/Time   DIAGPAP  04/29/2021 1136    - Negative for intraepithelial lesion or malignancy (NILM)   ADEQPAP  04/29/2021 1136    Satisfactory for evaluation; transformation zone component PRESENT.   PCP is Gilmore Laroche NP  Review of Systems +weight gain with Abilify  Not currently having sex Reviewed past medical,surgical, social and family history. Reviewed medications and allergies.     Objective:   Physical Exam BP 138/88 (BP Location: Left Arm, Patient Position: Sitting, Cuff Size: Large)   Pulse 90   Ht 5\' 5"  (1.651 m)   Wt (!) 337 lb (152.9 kg)   LMP 11/08/2022   BMI 56.08 kg/m  UPT is negative    Skin warm and dry. Lungs: clear to ausculation bilaterally. Cardiovascular: regular rate and rhythm.   Upstream - 11/24/22 1054       Pregnancy Intention Screening   Does the patient want to become pregnant in the next year? No    Does the patient's partner want to become pregnant in the next year? No    Would the patient like to discuss contraceptive options today? Yes      Contraception Wrap Up   Current Method Abstinence    End Method Oral Contraceptive    Contraception Counseling Provided Yes    How was the end contraceptive method provided? Prescription             Assessment:     1. Pregnancy examination or test, negative result - POCT urine pregnancy  2. Encounter for initial prescription of contraceptive pills Will rx Micronor, can start today,take same time everyday, if has sex use condoms Meds ordered this encounter  Medications   norethindrone (MICRONOR) 0.35 MG tablet    Sig: Take 1 tablet (0.35 mg total) by mouth daily.    Dispense:  28 tablet    Refill:  11     Order Specific Question:   Supervising Provider    Answer:   Despina Hidden, LUTHER H [[id number]]     3. Morbid obesity Has gained weight with Abilify  4. Essential hypertension Continue meds and follow up with PCP    Plan:     Follow up in 3 months for ROS

## 2022-11-25 ENCOUNTER — Ambulatory Visit (INDEPENDENT_AMBULATORY_CARE_PROVIDER_SITE_OTHER): Payer: Medicaid Other | Admitting: Internal Medicine

## 2022-11-25 ENCOUNTER — Encounter: Payer: Self-pay | Admitting: Internal Medicine

## 2022-11-25 VITALS — BP 138/88 | HR 107 | Resp 16 | Ht 65.0 in | Wt 334.0 lb

## 2022-11-25 DIAGNOSIS — I10 Essential (primary) hypertension: Secondary | ICD-10-CM

## 2022-11-25 MED ORDER — OZEMPIC (0.25 OR 0.5 MG/DOSE) 2 MG/3ML ~~LOC~~ SOPN: 0.2500 mg | PEN_INJECTOR | SUBCUTANEOUS | 0 refills | Status: DC

## 2022-11-25 MED ORDER — OLMESARTAN MEDOXOMIL 20 MG PO TABS: 20.0000 mg | ORAL_TABLET | Freq: Every day | ORAL | 0 refills | Status: DC

## 2022-11-25 NOTE — Progress Notes (Signed)
   HPI:[name] is a 22 y.o. female who presents for evaluation of Hypertension (Have been getting 150-160/100 bp readings at home ) . For the details of today's visit, please refer to the assessment and plan.  Physical Exam: Vitals:   11/25/22 1313 11/25/22 1320  BP: 138/87 138/88  Pulse: (!) 107   Resp: 16   SpO2: 97%   Weight: (!) 334 lb (151.5 kg)   Height: 5\' 5"  (1.651 m)      Physical Exam Constitutional:      General: She is not in acute distress.    Appearance: She is well-developed and well-groomed. She is morbidly obese.  Cardiovascular:     Rate and Rhythm: Regular rhythm. Tachycardia present.     Heart sounds: No murmur heard. Pulmonary:     Effort: Pulmonary effort is normal. No respiratory distress.     Breath sounds: No wheezing or rales.  Psychiatric:        Behavior: Behavior normal.        Thought Content: Thought content normal.      Assessment & Plan:   Kathy Rios was seen today for hypertension.  Essential hypertension Assessment & Plan: Patient's BP today is 138/88 with a goal of <130/80. On home monitoring SBP 150-160. The patient endorses adherence to Procardia 60 mg.   Plan: Continue Procardia 60 mg daily Start Olmesartan 20 mg daily Record BP at home and bring 10 readings to next visit Follow up in 6 weeks    Morbid obesity Assessment & Plan: Patient continues to gain weight from abilify, but her schizoaffective disorder bipolar type is stable. Ozempic approved by insurance.  Patient has BMI 55. Weight today is 334. Start Ozempic .25 mg weekly Follow up in 4 weeks.   Orders: -     Ozempic (0.25 or 0.5 MG/DOSE); Inject 0.25 mg into the skin once a week.  Dispense: 3 mL; Refill: 0  Other orders -     Olmesartan Medoxomil; Take 1 tablet (20 mg total) by mouth daily.  Dispense: 60 tablet; Refill: 0      Milus Banister, MD

## 2022-11-25 NOTE — Assessment & Plan Note (Addendum)
Patient continues to gain weight from abilify, but her schizoaffective disorder bipolar type is stable. Ozempic approved by insurance.  Patient has BMI 55. Weight today is 334. Start Ozempic .25 mg weekly Follow up in 4 weeks.

## 2022-11-25 NOTE — Patient Instructions (Addendum)
Thank you, Ms.[name] for allowing [place] to provide your care today.   Start olmesartan.  Record blood pressure at home and bring 10 readings to next visit. Start Ozempic  Follow up in 4 weeks   Thurmon Fair, M.D.

## 2022-11-25 NOTE — Assessment & Plan Note (Signed)
Patient's BP today is 138/88 with a goal of <130/80. On home monitoring SBP 150-160. The patient endorses adherence to Procardia 60 mg.   Plan: Continue Procardia 60 mg daily Start Olmesartan 20 mg daily Record BP at home and bring 10 readings to next visit Follow up in 6 weeks

## 2022-11-26 ENCOUNTER — Ambulatory Visit: Payer: Medicaid Other | Admitting: Family Medicine

## 2022-11-27 ENCOUNTER — Ambulatory Visit: Payer: Medicaid Other | Admitting: Nutrition

## 2022-11-28 ENCOUNTER — Encounter: Payer: Self-pay | Admitting: Internal Medicine

## 2022-12-09 ENCOUNTER — Telehealth (INDEPENDENT_AMBULATORY_CARE_PROVIDER_SITE_OTHER): Payer: No Typology Code available for payment source | Admitting: Psychiatry

## 2022-12-09 ENCOUNTER — Encounter (HOSPITAL_COMMUNITY): Payer: Self-pay | Admitting: Psychiatry

## 2022-12-09 DIAGNOSIS — F250 Schizoaffective disorder, bipolar type: Secondary | ICD-10-CM

## 2022-12-09 DIAGNOSIS — Z79899 Other long term (current) drug therapy: Secondary | ICD-10-CM | POA: Diagnosis not present

## 2022-12-09 DIAGNOSIS — F900 Attention-deficit hyperactivity disorder, predominantly inattentive type: Secondary | ICD-10-CM

## 2022-12-09 DIAGNOSIS — F4310 Post-traumatic stress disorder, unspecified: Secondary | ICD-10-CM

## 2022-12-09 DIAGNOSIS — F411 Generalized anxiety disorder: Secondary | ICD-10-CM

## 2022-12-09 MED ORDER — ATOMOXETINE HCL 80 MG PO CAPS: 80.0000 mg | ORAL_CAPSULE | Freq: Every day | ORAL | 2 refills | Status: DC

## 2022-12-09 NOTE — Patient Instructions (Signed)
We increased the Strattera to 80 mg once daily today.  Continue to be watchful for any signs of returning mania or hypomania or hallucinations.  If these occur please let our office know and decrease back to 40 mg dose.
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
## 2022-12-18 ENCOUNTER — Ambulatory Visit (INDEPENDENT_AMBULATORY_CARE_PROVIDER_SITE_OTHER): Payer: Medicaid Other | Admitting: Internal Medicine

## 2022-12-18 ENCOUNTER — Encounter: Payer: Self-pay | Admitting: Internal Medicine

## 2022-12-18 VITALS — BP 133/80 | HR 106 | Ht 65.0 in | Wt 330.2 lb

## 2022-12-18 DIAGNOSIS — I10 Essential (primary) hypertension: Secondary | ICD-10-CM

## 2022-12-18 MED ORDER — OZEMPIC (0.25 OR 0.5 MG/DOSE) 2 MG/3ML ~~LOC~~ SOPN: 0.5000 mg | PEN_INJECTOR | SUBCUTANEOUS | 0 refills | Status: DC

## 2022-12-18 MED ORDER — OLMESARTAN MEDOXOMIL 40 MG PO TABS: 40.0000 mg | ORAL_TABLET | Freq: Every day | ORAL | 2 refills | Status: DC

## 2022-12-18 NOTE — Patient Instructions (Signed)
It was a pleasure to see you today.  Thank you for giving [place] the opportunity to be involved in your care.  Below is a brief recap of your visit and next steps.  We will plan to see you again in 2-4 weeks.  Summary Increase olmesartan to 40 mg daily Increase Ozempic to 0.5 mg weekly starting next week Follow up in 2-4 weeks for BP check

## 2022-12-18 NOTE — Progress Notes (Signed)
Established Patient Office Visit  Subjective   Patient ID: Kathy Rios, female    DOB: August 28, 2000  Age: 22 y.o. MRN: [id number]  Chief Complaint  Patient presents with   Hypertension    Follow up   Kathy Rios returns to care today for HTN and obesity follow-up.  She was last evaluated on 4/9 by Dr. Barbaraann Faster at which time olmesartan 20 mg daily was added to her antihypertensive regimen.  She was also started on Ozempic for treatment of morbid obesity.  There have been no acute interval events.  Kathy Rios reports feeling well today.  She has not experienced any adverse side effects since starting Ozempic.  Her weight is down 4 pounds since her last appointment.  Kathy Rios stopped taking Procardia because her blood pressure significantly decreased to the point that she was symptomatic.  She continues to take olmesartan as recently prescribed.  She has no additional concerns to discuss today.  Past Medical History:  Diagnosis Date   ADD (attention deficit disorder)    ADHD (attention deficit hyperactivity disorder)    Anemia    Anxiety    Chronic constipation 03/08/2018   Depression    Diabetes mellitus    Diabetes mellitus, type II (HCC)    Encounter for menstrual regulation 01/25/2016   Hypertension    Irregular periods 10/25/2015   Major depressive disorder 03/22/2018   Obesity    Patient desires pregnancy 08/25/2022   PMS (premenstrual syndrome) 11/05/2015   Possible pregnancy 08/19/2022   PTSD (post-traumatic stress disorder)    Reactive hypoglycemia    Schizoaffective disorder, depressive type (HCC)    Severe menstrual cramps 11/05/2015   Thalassemia trait    Thalassemia trait, alpha    Past Surgical History:  Procedure Laterality Date   ADENOIDECTOMY     TONSILLECTOMY     WISDOM TOOTH EXTRACTION  07/2016   Social History   Tobacco Use   Smoking status: Never   Smokeless tobacco: Never  Vaping Use   Vaping Use: Never used  Substance Use Topics   Alcohol use:  Yes    Comment: once per month will have 1 alcohol unit   Drug use: Not Currently    Types: Marijuana    Comment: previously every 3-6 months but hasn't smoked in a few years   Family History  Problem Relation Age of Onset   Seizures Mother    Arthritis Mother    Diabetes Mother    Hyperlipidemia Mother    Depression Mother    Hyperlipidemia Father    Hypertension Father    Gout Father    Dementia Father    Hypertension Sister    Mental illness Brother    Hyperlipidemia Brother    ADD / ADHD Brother    Depression Brother    Hypertension Maternal Grandmother    Cancer Maternal Grandmother    Hypertension Maternal Grandfather    Thyroid disease Maternal Grandfather    Cancer Maternal Grandfather    Prostate cancer Maternal Grandfather    Anemia Paternal Grandmother    Cancer Paternal Grandmother        cervical   Hypertension Paternal Grandfather    Heart disease Paternal Grandfather    Hypertension Other    Other Other        heart skips-maternal great grandma   Other Other        fibroids- maternal grandma and great grandma   Heart disease Other    Schizophrenia Maternal Aunt  Bipolar disorder Maternal Aunt    Alcohol abuse Maternal Uncle    Allergies  Allergen Reactions   Selenium Sulfide Rash   Banana Other (See Comments)    headache   Motrin [Ibuprofen] Hives and Itching   Sulfa Antibiotics Rash   Review of Systems  Constitutional:  Negative for chills and fever.  HENT:  Negative for sore throat.   Respiratory:  Negative for cough and shortness of breath.   Cardiovascular:  Negative for chest pain, palpitations and leg swelling.  Gastrointestinal:  Negative for abdominal pain, blood in stool, constipation, diarrhea, nausea and vomiting.  Genitourinary:  Negative for dysuria and hematuria.  Musculoskeletal:  Negative for myalgias.  Skin:  Negative for itching and rash.  Neurological:  Negative for dizziness and headaches.  Psychiatric/Behavioral:   Negative for depression and suicidal ideas.      Objective:     BP 133/80   Pulse (!) 106   Ht 5\' 5"  (1.651 m)   Wt (!) 330 lb 3.2 oz (149.8 kg)   LMP 11/08/2022   SpO2 96%   BMI 54.95 kg/m  BP Readings from Last 3 Encounters:  12/18/22 133/80  11/25/22 138/88  11/24/22 138/88   Physical Exam Vitals reviewed.  Constitutional:      General: She is not in acute distress.    Appearance: Normal appearance. She is obese. She is not toxic-appearing.  HENT:     Head: Normocephalic and atraumatic.     Right Ear: External ear normal.     Left Ear: External ear normal.     Nose: Nose normal. No congestion or rhinorrhea.     Mouth/Throat:     Mouth: Mucous membranes are moist.     Pharynx: Oropharynx is clear. No oropharyngeal exudate or posterior oropharyngeal erythema.  Eyes:     General: No scleral icterus.    Extraocular Movements: Extraocular movements intact.     Conjunctiva/sclera: Conjunctivae normal.     Pupils: Pupils are equal, round, and reactive to light.  Cardiovascular:     Rate and Rhythm: Normal rate and regular rhythm.     Pulses: Normal pulses.     Heart sounds: Normal heart sounds. No murmur heard.    No friction rub. No gallop.  Pulmonary:     Effort: Pulmonary effort is normal.     Breath sounds: Normal breath sounds. No wheezing, rhonchi or rales.  Abdominal:     General: Abdomen is flat. Bowel sounds are normal. There is no distension.     Palpations: Abdomen is soft.     Tenderness: There is no abdominal tenderness.  Musculoskeletal:        General: No swelling. Normal range of motion.     Cervical back: Normal range of motion.     Right lower leg: No edema.     Left lower leg: No edema.  Lymphadenopathy:     Cervical: No cervical adenopathy.  Skin:    General: Skin is warm and dry.     Capillary Refill: Capillary refill takes less than 2 seconds.     Coloration: Skin is not jaundiced.  Neurological:     General: No focal deficit present.      Mental Status: She is alert and oriented to person, place, and time.  Psychiatric:        Mood and Affect: Mood normal.        Behavior: Behavior normal.   Last CBC Lab Results  Component Value Date   WBC 6.6  09/23/2022   HGB 10.9 (L) 09/23/2022   HCT 35.4 09/23/2022   MCV 67 (L) 09/23/2022   MCH 20.5 (L) 09/23/2022   RDW 15.0 09/23/2022   PLT 424 09/23/2022   Last metabolic panel Lab Results  Component Value Date   GLUCOSE 93 09/23/2022   NA 140 09/23/2022   K 4.7 09/23/2022   CL 104 09/23/2022   CO2 22 09/23/2022   BUN 10 09/23/2022   CREATININE 0.59 09/23/2022   EGFR 130 09/23/2022   CALCIUM 9.7 09/23/2022   PROT 7.4 09/23/2022   ALBUMIN 4.3 09/23/2022   LABGLOB 3.1 09/23/2022   AGRATIO 1.4 09/23/2022   BILITOT 0.2 09/23/2022   ALKPHOS 142 (H) 09/23/2022   AST 16 09/23/2022   ALT 19 09/23/2022   ANIONGAP 8 09/23/2021   Last lipids Lab Results  Component Value Date   CHOL 218 (H) 09/23/2022   HDL 70 09/23/2022   LDLCALC 134 (H) 09/23/2022   TRIG 78 09/23/2022   CHOLHDL 3.1 09/23/2022   Last hemoglobin A1c Lab Results  Component Value Date   HGBA1C 5.8 10/28/2022   Last thyroid functions Lab Results  Component Value Date   TSH 2.070 09/23/2022   Last vitamin D Lab Results  Component Value Date   VD25OH 20.0 (L) 09/23/2022   Last vitamin B12 and Folate Lab Results  Component Value Date   VITAMINB12 401 05/23/2021     Assessment & Plan:   Problem List Items Addressed This Visit       Essential hypertension - Primary    Presenting today for HTN follow-up.  Olmesartan 20 mg daily was added to her antihypertensive regimen at her last appointment.  She is additionally prescribed Procardia 60 mg daily but stopped taking this medication as she developed symptomatic hypotension when olmesartan was added.  Her blood pressure today is 133/80. -Increase olmesartan to 40 mg daily. -Follow-up in 4 weeks for HTN check      Morbid obesity (HCC)    BMI  54.9.  She has recently started Ozempic 0.25 mg weekly.  She has not seen any adverse side effects since starting medication.  Her weight today is 330 pounds. -Increase Ozempic to 0.5 mg weekly -Follow-up in 4 weeks for reassessment       Return in about 4 weeks (around 01/15/2023) for HTN.    Billie Lade, MD

## 2022-12-18 NOTE — Assessment & Plan Note (Signed)
Presenting today for HTN follow-up.  Olmesartan 20 mg daily was added to her antihypertensive regimen at her last appointment.  She is additionally prescribed Procardia 60 mg daily but stopped taking this medication as she developed symptomatic hypotension when olmesartan was added.  Her blood pressure today is 133/80. -Increase olmesartan to 40 mg daily. -Follow-up in 4 weeks for HTN check

## 2022-12-18 NOTE — Assessment & Plan Note (Signed)
BMI 54.9.  She has recently started Ozempic 0.25 mg weekly.  She has not seen any adverse side effects since starting medication.  Her weight today is 330 pounds. -Increase Ozempic to 0.5 mg weekly -Follow-up in 4 weeks for reassessment

## 2022-12-22 ENCOUNTER — Telehealth (HOSPITAL_COMMUNITY): Payer: No Typology Code available for payment source | Admitting: Psychiatry

## 2022-12-22 ENCOUNTER — Ambulatory Visit: Payer: Medicaid Other | Admitting: Family Medicine

## 2022-12-22 ENCOUNTER — Encounter (HOSPITAL_COMMUNITY): Payer: Self-pay

## 2022-12-22 ENCOUNTER — Telehealth (INDEPENDENT_AMBULATORY_CARE_PROVIDER_SITE_OTHER): Payer: No Typology Code available for payment source | Admitting: Psychiatry

## 2022-12-22 ENCOUNTER — Encounter (HOSPITAL_COMMUNITY): Payer: Self-pay | Admitting: Psychiatry

## 2022-12-22 DIAGNOSIS — F900 Attention-deficit hyperactivity disorder, predominantly inattentive type: Secondary | ICD-10-CM | POA: Diagnosis not present

## 2022-12-22 DIAGNOSIS — Z6841 Body Mass Index (BMI) 40.0 and over, adult: Secondary | ICD-10-CM

## 2022-12-22 DIAGNOSIS — F250 Schizoaffective disorder, bipolar type: Secondary | ICD-10-CM | POA: Diagnosis not present

## 2022-12-22 DIAGNOSIS — Z79899 Other long term (current) drug therapy: Secondary | ICD-10-CM | POA: Diagnosis not present

## 2022-12-22 DIAGNOSIS — F411 Generalized anxiety disorder: Secondary | ICD-10-CM

## 2022-12-22 MED ORDER — ARIPIPRAZOLE 30 MG PO TABS: 30.0000 mg | ORAL_TABLET | Freq: Every day | ORAL | 2 refills | Status: DC

## 2022-12-22 NOTE — Patient Instructions (Signed)
We did not make any medication changes today.  If you are still not having any manic or hypomanic symptoms.
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
## 2022-12-23 ENCOUNTER — Ambulatory Visit: Payer: Medicaid Other | Admitting: Internal Medicine

## 2022-12-23 ENCOUNTER — Ambulatory Visit: Payer: Medicaid Other | Admitting: Family Medicine

## 2023-01-07 ENCOUNTER — Telehealth (INDEPENDENT_AMBULATORY_CARE_PROVIDER_SITE_OTHER): Payer: No Typology Code available for payment source | Admitting: Psychiatry

## 2023-01-07 ENCOUNTER — Encounter (HOSPITAL_COMMUNITY): Payer: Self-pay | Admitting: Psychiatry

## 2023-01-07 DIAGNOSIS — F900 Attention-deficit hyperactivity disorder, predominantly inattentive type: Secondary | ICD-10-CM | POA: Diagnosis not present

## 2023-01-07 DIAGNOSIS — Z79899 Other long term (current) drug therapy: Secondary | ICD-10-CM

## 2023-01-07 DIAGNOSIS — F4310 Post-traumatic stress disorder, unspecified: Secondary | ICD-10-CM | POA: Diagnosis not present

## 2023-01-07 DIAGNOSIS — F250 Schizoaffective disorder, bipolar type: Secondary | ICD-10-CM

## 2023-01-07 MED ORDER — ATOMOXETINE HCL 100 MG PO CAPS
100.0000 mg | ORAL_CAPSULE | Freq: Every day | ORAL | 2 refills | Status: DC
Start: 2023-01-07 — End: 2023-03-05

## 2023-01-07 NOTE — Patient Instructions (Signed)
We increased the Strattera to 100 mg once daily today.  As before please continue to be on the lookout for symptoms of hypomania or returning psychosis.
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
## 2023-01-08 ENCOUNTER — Telehealth (HOSPITAL_COMMUNITY): Payer: No Typology Code available for payment source | Admitting: Psychiatry

## 2023-01-16 ENCOUNTER — Ambulatory Visit (INDEPENDENT_AMBULATORY_CARE_PROVIDER_SITE_OTHER): Payer: Medicaid Other | Admitting: Internal Medicine

## 2023-01-16 ENCOUNTER — Encounter: Payer: Self-pay | Admitting: Internal Medicine

## 2023-01-16 DIAGNOSIS — I10 Essential (primary) hypertension: Secondary | ICD-10-CM

## 2023-01-16 DIAGNOSIS — R7303 Prediabetes: Secondary | ICD-10-CM

## 2023-01-16 MED ORDER — SEMAGLUTIDE (1 MG/DOSE) 4 MG/3ML ~~LOC~~ SOPN
1.0000 mg | PEN_INJECTOR | SUBCUTANEOUS | 0 refills | Status: DC
Start: 2023-01-16 — End: 2023-02-10

## 2023-01-16 NOTE — Assessment & Plan Note (Signed)
She has completed 4 injections of Ozempic 0.5 mg weekly.  Weight today is 328 pounds. -Increase Ozempic to 1 mg weekly

## 2023-01-16 NOTE — Assessment & Plan Note (Signed)
Presenting today for HTN follow-up.  Olmesartan was increased to 40 mg daily at her last appointment.  BP today is 127/84. -No additional medication changes today.  Continue olmesartan 40 mg daily.

## 2023-01-16 NOTE — Progress Notes (Signed)
Established Patient Office Visit  Subjective   Patient ID: Kathy Rios, female    DOB: 08-04-2001  Age: 22 y.o. MRN: [id number]  Chief Complaint  Patient presents with   Diabetes    Follow up   Kathy Rios returns to care today for HTN and obesity follow-up.  Last evaluated by me on 5/2 at which time olmesartan was increased to 40 mg daily for improved treatment of hypertension.  Ozempic was also increased to 0.5 mg weekly.  In the interim she has been evaluated by psychiatry.  There have otherwise been no acute interval events.  Kathy Rios reports feeling well today.  She is asymptomatic and has no additional concerns to discuss.  Past Medical History:  Diagnosis Date   ADD (attention deficit disorder)    ADHD (attention deficit hyperactivity disorder)    Anemia    Anxiety    Chronic constipation 03/08/2018   Depression    Diabetes mellitus    Diabetes mellitus, type II (HCC)    Encounter for menstrual regulation 01/25/2016   Generalized anxiety disorder 07/03/2022   Hypertension    Insomnia 06/27/2022   Irregular periods 10/25/2015   Major depressive disorder 03/22/2018   Obesity    Patient desires pregnancy 08/25/2022   PMS (premenstrual syndrome) 11/05/2015   Possible pregnancy 08/19/2022   PTSD (post-traumatic stress disorder)    Reactive hypoglycemia    Schizoaffective disorder, depressive type (HCC)    Severe menstrual cramps 11/05/2015   Thalassemia trait    Thalassemia trait, alpha    Past Surgical History:  Procedure Laterality Date   ADENOIDECTOMY     TONSILLECTOMY     WISDOM TOOTH EXTRACTION  07/2016   Social History   Tobacco Use   Smoking status: Never   Smokeless tobacco: Never  Vaping Use   Vaping Use: Never used  Substance Use Topics   Alcohol use: Not Currently    Comment: once per month will have 1 alcohol unit   Drug use: Not Currently    Types: Marijuana    Comment: previously every 3-6 months but hasn't smoked in a few years    Family History  Problem Relation Age of Onset   Seizures Mother    Arthritis Mother    Diabetes Mother    Hyperlipidemia Mother    Depression Mother    Hyperlipidemia Father    Hypertension Father    Gout Father    Dementia Father    Hypertension Sister    Mental illness Brother    Hyperlipidemia Brother    ADD / ADHD Brother    Depression Brother    Hypertension Maternal Grandmother    Cancer Maternal Grandmother    Hypertension Maternal Grandfather    Thyroid disease Maternal Grandfather    Cancer Maternal Grandfather    Prostate cancer Maternal Grandfather    Anemia Paternal Grandmother    Cancer Paternal Grandmother        cervical   Hypertension Paternal Grandfather    Heart disease Paternal Grandfather    Hypertension Other    Other Other        heart skips-maternal great grandma   Other Other        fibroids- maternal grandma and great grandma   Heart disease Other    Schizophrenia Maternal Aunt    Bipolar disorder Maternal Aunt    Alcohol abuse Maternal Uncle    Allergies  Allergen Reactions   Selenium Sulfide Rash   Banana Other (See Comments)  headache   Motrin [Ibuprofen] Hives and Itching   Sulfa Antibiotics Rash   Review of Systems  Constitutional:  Negative for chills and fever.  HENT:  Negative for sore throat.   Respiratory:  Negative for cough and shortness of breath.   Cardiovascular:  Negative for chest pain, palpitations and leg swelling.  Gastrointestinal:  Negative for abdominal pain, blood in stool, constipation, diarrhea, nausea and vomiting.  Genitourinary:  Negative for dysuria and hematuria.  Musculoskeletal:  Negative for myalgias.  Skin:  Negative for itching and rash.  Neurological:  Negative for dizziness and headaches.  Psychiatric/Behavioral:  Negative for depression and suicidal ideas.      Objective:     BP 127/84   Pulse (!) 106   Ht 5\' 5"  (1.651 m)   Wt (!) 328 lb (148.8 kg)   SpO2 96%   BMI 54.58 kg/m  BP  Readings from Last 3 Encounters:  01/16/23 127/84  12/18/22 133/80  11/25/22 138/88   Physical Exam Vitals reviewed.  Constitutional:      General: She is not in acute distress.    Appearance: Normal appearance. She is obese. She is not toxic-appearing.  HENT:     Head: Normocephalic and atraumatic.     Right Ear: External ear normal.     Left Ear: External ear normal.     Nose: Nose normal. No congestion or rhinorrhea.     Mouth/Throat:     Mouth: Mucous membranes are moist.     Pharynx: Oropharynx is clear. No oropharyngeal exudate or posterior oropharyngeal erythema.  Eyes:     General: No scleral icterus.    Extraocular Movements: Extraocular movements intact.     Conjunctiva/sclera: Conjunctivae normal.     Pupils: Pupils are equal, round, and reactive to light.  Cardiovascular:     Rate and Rhythm: Normal rate and regular rhythm.     Pulses: Normal pulses.     Heart sounds: Normal heart sounds. No murmur heard.    No friction rub. No gallop.  Pulmonary:     Effort: Pulmonary effort is normal.     Breath sounds: Normal breath sounds. No wheezing, rhonchi or rales.  Abdominal:     General: Abdomen is flat. Bowel sounds are normal. There is no distension.     Palpations: Abdomen is soft.     Tenderness: There is no abdominal tenderness.  Musculoskeletal:        General: No swelling. Normal range of motion.     Cervical back: Normal range of motion.     Right lower leg: No edema.     Left lower leg: No edema.  Lymphadenopathy:     Cervical: No cervical adenopathy.  Skin:    General: Skin is warm and dry.     Capillary Refill: Capillary refill takes less than 2 seconds.     Coloration: Skin is not jaundiced.  Neurological:     General: No focal deficit present.     Mental Status: She is alert and oriented to person, place, and time.  Psychiatric:        Mood and Affect: Mood normal.        Behavior: Behavior normal.   Last CBC Lab Results  Component Value Date    WBC 6.6 09/23/2022   HGB 10.9 (L) 09/23/2022   HCT 35.4 09/23/2022   MCV 67 (L) 09/23/2022   MCH 20.5 (L) 09/23/2022   RDW 15.0 09/23/2022   PLT 424 09/23/2022   Last metabolic  panel Lab Results  Component Value Date   GLUCOSE 93 09/23/2022   NA 140 09/23/2022   K 4.7 09/23/2022   CL 104 09/23/2022   CO2 22 09/23/2022   BUN 10 09/23/2022   CREATININE 0.59 09/23/2022   EGFR 130 09/23/2022   CALCIUM 9.7 09/23/2022   PROT 7.4 09/23/2022   ALBUMIN 4.3 09/23/2022   LABGLOB 3.1 09/23/2022   AGRATIO 1.4 09/23/2022   BILITOT 0.2 09/23/2022   ALKPHOS 142 (H) 09/23/2022   AST 16 09/23/2022   ALT 19 09/23/2022   ANIONGAP 8 09/23/2021   Last lipids Lab Results  Component Value Date   CHOL 218 (H) 09/23/2022   HDL 70 09/23/2022   LDLCALC 134 (H) 09/23/2022   TRIG 78 09/23/2022   CHOLHDL 3.1 09/23/2022   Last hemoglobin A1c Lab Results  Component Value Date   HGBA1C 5.8 10/28/2022   Last thyroid functions Lab Results  Component Value Date   TSH 2.070 09/23/2022   Last vitamin D Lab Results  Component Value Date   VD25OH 20.0 (L) 09/23/2022   Last vitamin B12 and Folate Lab Results  Component Value Date   VITAMINB12 401 05/23/2021     Assessment & Plan:   Problem List Items Addressed This Visit     Essential hypertension    Presenting today for HTN follow-up.  Olmesartan was increased to 40 mg daily at her last appointment.  BP today is 127/84. -No additional medication changes today.  Continue olmesartan 40 mg daily.      Morbid obesity (HCC) - Primary    She has completed 4 injections of Ozempic 0.5 mg weekly.  Weight today is 328 pounds. -Increase Ozempic to 1 mg weekly       Return in about 3 months (around 04/18/2023).    Billie Lade, MD

## 2023-01-16 NOTE — Patient Instructions (Signed)
It was a pleasure to see you today.  Thank you for giving [place] the opportunity to be involved in your care.  Below is a brief recap of your visit and next steps.  We will plan to see you again in 3 months.  Summary Increase Ozempic to 1 mg weekly No additional changes to BP medications  Follow up in 3 months

## 2023-01-21 ENCOUNTER — Encounter (HOSPITAL_COMMUNITY): Payer: Self-pay | Admitting: Psychiatry

## 2023-01-21 ENCOUNTER — Telehealth (HOSPITAL_COMMUNITY): Payer: No Typology Code available for payment source | Admitting: Psychiatry

## 2023-01-21 DIAGNOSIS — Z79899 Other long term (current) drug therapy: Secondary | ICD-10-CM | POA: Diagnosis not present

## 2023-01-21 DIAGNOSIS — F900 Attention-deficit hyperactivity disorder, predominantly inattentive type: Secondary | ICD-10-CM

## 2023-01-21 DIAGNOSIS — F4310 Post-traumatic stress disorder, unspecified: Secondary | ICD-10-CM | POA: Diagnosis not present

## 2023-01-21 DIAGNOSIS — F250 Schizoaffective disorder, bipolar type: Secondary | ICD-10-CM | POA: Diagnosis not present

## 2023-01-21 NOTE — Patient Instructions (Signed)
We did not make any medication changes today.  We will continue to assess whether we need to switch from the Abilify and overall efficacy of the Strattera.
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
## 2023-02-09 ENCOUNTER — Other Ambulatory Visit: Payer: Self-pay | Admitting: Adult Health

## 2023-02-09 MED ORDER — NORETHIN ACE-ETH ESTRAD-FE 1-20 MG-MCG PO TABS: 1.0000 | ORAL_TABLET | Freq: Every day | ORAL | 11 refills | Status: DC

## 2023-02-09 NOTE — Progress Notes (Signed)
Rx junel 1/20 

## 2023-02-10 ENCOUNTER — Encounter: Payer: Self-pay | Admitting: Internal Medicine

## 2023-02-10 ENCOUNTER — Ambulatory Visit (INDEPENDENT_AMBULATORY_CARE_PROVIDER_SITE_OTHER): Payer: Medicaid Other | Admitting: Internal Medicine

## 2023-02-10 VITALS — BP 138/82 | HR 119 | Ht 65.0 in | Wt 327.2 lb

## 2023-02-10 DIAGNOSIS — R7303 Prediabetes: Secondary | ICD-10-CM

## 2023-02-10 DIAGNOSIS — R630 Anorexia: Secondary | ICD-10-CM

## 2023-02-10 DIAGNOSIS — Z23 Encounter for immunization: Secondary | ICD-10-CM | POA: Diagnosis not present

## 2023-02-10 MED ORDER — SEMAGLUTIDE (1 MG/DOSE) 4 MG/3ML ~~LOC~~ SOPN
1.0000 mg | PEN_INJECTOR | SUBCUTANEOUS | 2 refills | Status: DC
Start: 2023-02-10 — End: 2023-03-03

## 2023-02-10 NOTE — Assessment & Plan Note (Signed)
Third HPV vaccination administered today

## 2023-02-10 NOTE — Progress Notes (Signed)
Acute Office Visit  Subjective:     Patient ID: Kathy Rios, female    DOB: 2001/08/05, 22 y.o.   MRN: [id number]  Chief Complaint  Patient presents with   ozempic    Went from 3 meals down to 1 meal a day.    Ms. Ghuman presents today for an acute visit to discuss medication side effects.  She was last evaluated by me on 5/31 at which time Ozempic was increased to 1 mg weekly.  Since that time, Ms. Simmon has noted significant decrease in her appetite.  She has gone from eating 3 meals daily down to 1 meal per day.  She denies nausea/vomiting as well as constipation.  The abrupt reduction in her daily eating has caused her concern.  She does not have any additional concerns to discuss today  Review of Systems  Gastrointestinal:        Decreased appetite  All other systems reviewed and are negative.     Objective:    BP 138/82   Pulse (!) 119   Ht 5\' 5"  (1.651 m)   Wt (!) 327 lb 3.2 oz (148.4 kg)   SpO2 97%   BMI 54.45 kg/m   Physical Exam Vitals reviewed.  Constitutional:      General: She is not in acute distress.    Appearance: Normal appearance. She is obese. She is not toxic-appearing.  HENT:     Head: Normocephalic and atraumatic.     Right Ear: External ear normal.     Left Ear: External ear normal.     Nose: Nose normal. No congestion or rhinorrhea.     Mouth/Throat:     Mouth: Mucous membranes are moist.     Pharynx: Oropharynx is clear. No oropharyngeal exudate or posterior oropharyngeal erythema.  Eyes:     General: No scleral icterus.    Extraocular Movements: Extraocular movements intact.     Conjunctiva/sclera: Conjunctivae normal.     Pupils: Pupils are equal, round, and reactive to light.  Cardiovascular:     Rate and Rhythm: Normal rate and regular rhythm.     Pulses: Normal pulses.     Heart sounds: Normal heart sounds. No murmur heard.    No friction rub. No gallop.  Pulmonary:     Effort: Pulmonary effort is normal.     Breath sounds:  Normal breath sounds. No wheezing, rhonchi or rales.  Abdominal:     General: Abdomen is flat. Bowel sounds are normal. There is no distension.     Palpations: Abdomen is soft.     Tenderness: There is no abdominal tenderness.  Musculoskeletal:        General: No swelling. Normal range of motion.     Cervical back: Normal range of motion.     Right lower leg: No edema.     Left lower leg: No edema.  Lymphadenopathy:     Cervical: No cervical adenopathy.  Skin:    General: Skin is warm and dry.     Capillary Refill: Capillary refill takes less than 2 seconds.     Coloration: Skin is not jaundiced.  Neurological:     General: No focal deficit present.     Mental Status: She is alert and oriented to person, place, and time.  Psychiatric:        Mood and Affect: Mood normal.        Behavior: Behavior normal.       Assessment & Plan:   Problem List  Items Addressed This Visit       Decreased appetite    Presenting today for an acute visit in the setting of a decreased appetite.  This has occurred since increasing Ozempic to 1 mg weekly.  She has gone from eating 3 meals daily to one.  She denies additional symptoms such as nausea/vomiting, abdominal pain, and constipation. -I reviewed with Ms. Mangano that appetite reduction is a common side effect of Ozempic based on its mechanism of action.  I offered to reduce the dose of Ozempic to 0.5 mg, but she would prefer to continue at 1 mg for now.  We also reviewed the importance of eating meals with a high nutritive value since she is only eating 1 meal per day currently.  I have also offered to refer her to a dietitian, but she has declined for now, noting that she has met with a dietitian previously and did not find it beneficial. -She will return to care for routine follow-up in late August.      Need for HPV vaccination - Primary    Third HPV vaccination administered today       Meds ordered this encounter  Medications    Semaglutide, 1 MG/DOSE, 4 MG/3ML SOPN    Sig: Inject 1 mg as directed once a week.    Dispense:  3 mL    Refill:  2    Return if symptoms worsen or fail to improve.  Billie Lade, MD

## 2023-02-10 NOTE — Assessment & Plan Note (Signed)
Presenting today for an acute visit in the setting of a decreased appetite.  This has occurred since increasing Ozempic to 1 mg weekly.  She has gone from eating 3 meals daily to one.  She denies additional symptoms such as nausea/vomiting, abdominal pain, and constipation. -I reviewed with Kathy Rios that appetite reduction is a common side effect of Ozempic based on its mechanism of action.  I offered to reduce the dose of Ozempic to 0.5 mg, but she would prefer to continue at 1 mg for now.  We also reviewed the importance of eating meals with a high nutritive value since she is only eating 1 meal per day currently.  I have also offered to refer her to a dietitian, but she has declined for now, noting that she has met with a dietitian previously and did not find it beneficial. -She will return to care for routine follow-up in late August.

## 2023-02-10 NOTE — Patient Instructions (Signed)
It was a pleasure to see you today.  Thank you for giving [place] the opportunity to be involved in your care.  Below is a brief recap of your visit and next steps.  We will plan to see you again in August.  Summary No medication changes today Continue Ozempic at 1 mg weekly Follow up as scheduled in late August

## 2023-02-11 ENCOUNTER — Telehealth (HOSPITAL_COMMUNITY): Payer: No Typology Code available for payment source | Admitting: Psychiatry

## 2023-02-11 ENCOUNTER — Encounter (HOSPITAL_COMMUNITY): Payer: Self-pay | Admitting: Psychiatry

## 2023-02-11 DIAGNOSIS — Z79899 Other long term (current) drug therapy: Secondary | ICD-10-CM

## 2023-02-11 DIAGNOSIS — F250 Schizoaffective disorder, bipolar type: Secondary | ICD-10-CM | POA: Diagnosis not present

## 2023-02-11 DIAGNOSIS — N943 Premenstrual tension syndrome: Secondary | ICD-10-CM

## 2023-02-11 DIAGNOSIS — F4310 Post-traumatic stress disorder, unspecified: Secondary | ICD-10-CM | POA: Diagnosis not present

## 2023-02-11 DIAGNOSIS — F900 Attention-deficit hyperactivity disorder, predominantly inattentive type: Secondary | ICD-10-CM

## 2023-02-11 NOTE — Patient Instructions (Signed)
We did not make any medication changes today.  Keep up the good work with training your concentration abilities further.  I agree with Dr. Durwin Nora and swapping out sweets for fruits and vegetables as well as regular exercise would be a good next step to take.
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
## 2023-02-23 ENCOUNTER — Ambulatory Visit: Payer: MEDICAID | Admitting: Adult Health

## 2023-02-23 ENCOUNTER — Encounter: Payer: Self-pay | Admitting: Adult Health

## 2023-02-23 VITALS — BP 132/84 | HR 107 | Ht 65.0 in | Wt 326.8 lb

## 2023-02-23 DIAGNOSIS — Z3041 Encounter for surveillance of contraceptive pills: Secondary | ICD-10-CM

## 2023-02-23 NOTE — Progress Notes (Signed)
  Subjective:     Patient ID: Kathy Rios, female   DOB: May 16, 2001, 22 y.o.   MRN: [id number]  HPI Kathy Rios is a 22 year old black female,single, G0P0, back in follow up on OC, was on Micronor and had irregular bleeding, so stopped it and started junel 1/20. Just started yesterday.     Component Value Date/Time   DIAGPAP  04/29/2021 1136    - Negative for intraepithelial lesion or malignancy (NILM)   ADEQPAP  04/29/2021 1136    Satisfactory for evaluation; transformation zone component PRESENT.    PCP is Dr Durwin Nora  Review of Systems Had irregular bleeding with Micronor Reviewed past medical,surgical, social and family history. Reviewed medications and allergies.     Objective:   Physical Exam BP 132/84 (BP Location: Left Arm, Patient Position: Sitting, Cuff Size: Normal)   Pulse (!) 107   Ht 5\' 5"  (1.651 m)   Wt (!) 326 lb 12.8 oz (148.2 kg)   LMP 02/03/2023   BMI 54.38 kg/m   Skin warm and dry. Lungs: clear to ausculation bilaterally. Cardiovascular: regular rate and rhythm.      Upstream - 02/23/23 1118       Pregnancy Intention Screening   Does the patient want to become pregnant in the next year? No    Does the patient's partner want to become pregnant in the next year? No    Would the patient like to discuss contraceptive options today? No      Contraception Wrap Up   Current Method Oral Contraceptive    End Method Oral Contraceptive             Assessment:     1. Encounter for surveillance of contraceptive pills On Junel 1/20 now, has refills    Plan:     Follow up in 3 months for BP check and ROS

## 2023-03-03 ENCOUNTER — Encounter: Payer: Self-pay | Admitting: Internal Medicine

## 2023-03-03 MED ORDER — ZEPBOUND 2.5 MG/0.5ML ~~LOC~~ SOAJ
2.5000 mg | SUBCUTANEOUS | 0 refills | Status: DC
Start: 2023-03-03 — End: 2023-03-11

## 2023-03-05 ENCOUNTER — Telehealth (HOSPITAL_COMMUNITY): Payer: MEDICAID | Admitting: Psychiatry

## 2023-03-05 ENCOUNTER — Encounter (HOSPITAL_COMMUNITY): Payer: Self-pay

## 2023-03-05 DIAGNOSIS — F900 Attention-deficit hyperactivity disorder, predominantly inattentive type: Secondary | ICD-10-CM

## 2023-03-05 MED ORDER — ATOMOXETINE HCL 100 MG PO CAPS: 100.0000 mg | ORAL_CAPSULE | Freq: Every day | ORAL | 2 refills | Status: DC

## 2023-03-06 ENCOUNTER — Telehealth (INDEPENDENT_AMBULATORY_CARE_PROVIDER_SITE_OTHER): Payer: MEDICAID | Admitting: Psychiatry

## 2023-03-06 ENCOUNTER — Encounter (HOSPITAL_COMMUNITY): Payer: Self-pay | Admitting: Psychiatry

## 2023-03-06 DIAGNOSIS — F900 Attention-deficit hyperactivity disorder, predominantly inattentive type: Secondary | ICD-10-CM

## 2023-03-06 DIAGNOSIS — Z79899 Other long term (current) drug therapy: Secondary | ICD-10-CM

## 2023-03-06 DIAGNOSIS — F4310 Post-traumatic stress disorder, unspecified: Secondary | ICD-10-CM | POA: Diagnosis not present

## 2023-03-06 DIAGNOSIS — Z6841 Body Mass Index (BMI) 40.0 and over, adult: Secondary | ICD-10-CM

## 2023-03-06 DIAGNOSIS — F250 Schizoaffective disorder, bipolar type: Secondary | ICD-10-CM

## 2023-03-06 MED ORDER — LURASIDONE HCL 20 MG PO TABS
ORAL_TABLET | ORAL | 0 refills | Status: DC
Start: 2023-03-06 — End: 2023-04-07

## 2023-03-06 MED ORDER — ARIPIPRAZOLE 10 MG PO TABS: ORAL_TABLET | ORAL | 0 refills | Status: DC
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
## 2023-03-06 NOTE — Patient Instructions (Signed)
We are going to cross taper abilify to 20mg  daily for days 1-3, 10mg  daily for days 4-7, 5mg  daily for days 7-9, and discontinue on day 10. We will also start lurasidone 20mg  daily for day 1-3, 40mg  daily for days 4-7, 60mg  daily for days 7-9, and 80mg  daily thereafter starting on day 10. Do not start the decreased dose of abilify until you have obtained the 20mg  of lurasidone. Please call the office on Monday for follow up appointment in 10 days.

## 2023-03-11 ENCOUNTER — Other Ambulatory Visit: Payer: Self-pay | Admitting: Internal Medicine

## 2023-03-11 DIAGNOSIS — R7303 Prediabetes: Secondary | ICD-10-CM

## 2023-03-11 MED ORDER — ZEPBOUND 5 MG/0.5ML ~~LOC~~ SOAJ
5.0000 mg | SUBCUTANEOUS | 0 refills | Status: AC
Start: 2023-03-11 — End: 2023-04-08

## 2023-03-13 ENCOUNTER — Telehealth (HOSPITAL_COMMUNITY): Payer: MEDICAID | Admitting: Psychiatry

## 2023-03-13 ENCOUNTER — Encounter (HOSPITAL_COMMUNITY): Payer: Self-pay

## 2023-03-17 ENCOUNTER — Telehealth (HOSPITAL_COMMUNITY): Payer: MEDICAID | Admitting: Psychiatry

## 2023-03-17 ENCOUNTER — Other Ambulatory Visit: Payer: Self-pay | Admitting: Internal Medicine

## 2023-03-17 ENCOUNTER — Telehealth (HOSPITAL_COMMUNITY): Payer: Self-pay | Admitting: Psychiatry

## 2023-03-17 DIAGNOSIS — I10 Essential (primary) hypertension: Secondary | ICD-10-CM

## 2023-03-17 DIAGNOSIS — F250 Schizoaffective disorder, bipolar type: Secondary | ICD-10-CM

## 2023-03-17 MED ORDER — ARIPIPRAZOLE 10 MG PO TABS
ORAL_TABLET | ORAL | 0 refills | Status: DC
Start: 2023-03-17 — End: 2023-04-07

## 2023-03-17 NOTE — Telephone Encounter (Signed)
Medicaid not wanting to cover taper of abilify, with preference for once daily dosing. Called in #30 with same instruction to take 20mg  for days 1-3, 10mg  for days 4-6, 5mg  for days 7-9, and then discontinue on day 10.

## 2023-03-18 ENCOUNTER — Telehealth (HOSPITAL_COMMUNITY): Payer: Self-pay | Admitting: *Deleted

## 2023-03-18 NOTE — Telephone Encounter (Signed)
PA started for patient Abilify 10 mg. Blank  Form from Insurance will be sent to office via fax for office to completed and faxed back before insurance can make their decision.   Per fax from pharmacy, Insurance is needing specific directions for script before it can be processed.

## 2023-03-30 ENCOUNTER — Encounter (HOSPITAL_COMMUNITY): Payer: Self-pay

## 2023-04-07 ENCOUNTER — Telehealth (INDEPENDENT_AMBULATORY_CARE_PROVIDER_SITE_OTHER): Payer: MEDICAID | Admitting: Psychiatry

## 2023-04-07 ENCOUNTER — Encounter (HOSPITAL_COMMUNITY): Payer: Self-pay | Admitting: Psychiatry

## 2023-04-07 DIAGNOSIS — Z9152 Personal history of nonsuicidal self-harm: Secondary | ICD-10-CM

## 2023-04-07 DIAGNOSIS — E669 Obesity, unspecified: Secondary | ICD-10-CM

## 2023-04-07 DIAGNOSIS — F411 Generalized anxiety disorder: Secondary | ICD-10-CM | POA: Diagnosis not present

## 2023-04-07 DIAGNOSIS — F4310 Post-traumatic stress disorder, unspecified: Secondary | ICD-10-CM

## 2023-04-07 DIAGNOSIS — E559 Vitamin D deficiency, unspecified: Secondary | ICD-10-CM

## 2023-04-07 DIAGNOSIS — Z79899 Other long term (current) drug therapy: Secondary | ICD-10-CM

## 2023-04-07 DIAGNOSIS — Z9151 Personal history of suicidal behavior: Secondary | ICD-10-CM

## 2023-04-07 DIAGNOSIS — G4700 Insomnia, unspecified: Secondary | ICD-10-CM | POA: Diagnosis not present

## 2023-04-07 DIAGNOSIS — Z5181 Encounter for therapeutic drug level monitoring: Secondary | ICD-10-CM

## 2023-04-07 DIAGNOSIS — R7303 Prediabetes: Secondary | ICD-10-CM

## 2023-04-07 DIAGNOSIS — N943 Premenstrual tension syndrome: Secondary | ICD-10-CM

## 2023-04-07 DIAGNOSIS — F900 Attention-deficit hyperactivity disorder, predominantly inattentive type: Secondary | ICD-10-CM

## 2023-04-07 DIAGNOSIS — F250 Schizoaffective disorder, bipolar type: Secondary | ICD-10-CM | POA: Diagnosis not present

## 2023-04-07 MED ORDER — CLONIDINE HCL ER 0.1 MG PO TB12
0.1000 mg | ORAL_TABLET | Freq: Every day | ORAL | 1 refills | Status: DC
Start: 2023-04-07 — End: 2023-08-11

## 2023-04-07 MED ORDER — LURASIDONE HCL 80 MG PO TABS
80.0000 mg | ORAL_TABLET | Freq: Every evening | ORAL | 2 refills | Status: DC
Start: 2023-04-07 — End: 2023-06-29
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
## 2023-04-07 NOTE — Patient Instructions (Addendum)
Continue taking lurasidone at 80mg  nightly with food.  We otherwise kept the clonidine and Strattera the same.

## 2023-04-16 ENCOUNTER — Encounter: Payer: Self-pay | Admitting: Internal Medicine

## 2023-04-16 ENCOUNTER — Ambulatory Visit (INDEPENDENT_AMBULATORY_CARE_PROVIDER_SITE_OTHER): Payer: MEDICAID | Admitting: Internal Medicine

## 2023-04-16 VITALS — BP 121/78 | HR 53 | Temp 97.9°F | Ht 65.0 in | Wt 317.2 lb

## 2023-04-16 DIAGNOSIS — D563 Thalassemia minor: Secondary | ICD-10-CM

## 2023-04-16 DIAGNOSIS — Z6841 Body Mass Index (BMI) 40.0 and over, adult: Secondary | ICD-10-CM

## 2023-04-16 DIAGNOSIS — B001 Herpesviral vesicular dermatitis: Secondary | ICD-10-CM | POA: Diagnosis not present

## 2023-04-16 MED ORDER — ZEPBOUND 7.5 MG/0.5ML ~~LOC~~ SOAJ
7.5000 mg | SUBCUTANEOUS | 0 refills | Status: DC
Start: 2023-04-16 — End: 2023-05-11

## 2023-04-16 MED ORDER — VALACYCLOVIR HCL 1 G PO TABS: 2000.0000 mg | ORAL_TABLET | Freq: Two times a day (BID) | ORAL | 1 refills | Status: AC | PRN

## 2023-04-16 NOTE — Patient Instructions (Signed)
It was a pleasure to see you today.  Thank you for giving [place] the opportunity to be involved in your care.  Below is a brief recap of your visit and next steps.  We will plan to see you again in 3 months.  Summary Increase Zepbound to 7.5 mg weekly Hematology referral placed Follow up in 3 months

## 2023-04-16 NOTE — Progress Notes (Signed)
Established Patient Office Visit  Subjective   Patient ID: Kathy Rios, female    DOB: 2001-06-06  Age: 22 y.o. MRN: [id number]  Chief Complaint  Patient presents with   Follow-up    States that the zepbound is working but states that since she started the 5 mg she has been having issues with diarrhea. States that the diarrhea lasts around 3 days after taking her injection and pt is having to take pepto or other anti diarrheal medications to help. Pt is also requesting refills on valacyclovir.   Kathy Rios returns to care today for routine follow-up.  She was last evaluated by me on 6/25 for an acute visit endorsing a decreased appetite.  In the interim, she has been seen by psychiatry and gynecology for follow-up.  There have otherwise been no acute interval events.  Kathy Rios reports feeling well today.  She is asymptomatic currently.  Her acute concern is requesting to undergo further workup for alpha thalassemia trait.  She would like clarification and counseling on her diagnosis as it relates to family-planning.  She has requested a referral to hematology.  Past Medical History:  Diagnosis Date   ADD (attention deficit disorder)    ADHD (attention deficit hyperactivity disorder)    Anemia    Anxiety    Chronic constipation 03/08/2018   Depression    Diabetes mellitus    Diabetes mellitus, type II (HCC)    Encounter for menstrual regulation 01/25/2016   Generalized anxiety disorder 07/03/2022   Hypertension    Insomnia 06/27/2022   Irregular periods 10/25/2015   Major depressive disorder 03/22/2018   Obesity    Patient desires pregnancy 08/25/2022   PMS (premenstrual syndrome) 11/05/2015   Possible pregnancy 08/19/2022   PTSD (post-traumatic stress disorder)    Reactive hypoglycemia    Schizoaffective disorder, depressive type (HCC)    Screening examination for STD (sexually transmitted disease) 12/18/2021   Severe menstrual cramps 11/05/2015   Thalassemia trait     Thalassemia trait, alpha    Past Surgical History:  Procedure Laterality Date   ADENOIDECTOMY     TONSILLECTOMY     WISDOM TOOTH EXTRACTION  07/2016   Social History   Tobacco Use   Smoking status: Never   Smokeless tobacco: Never  Vaping Use   Vaping status: Never Used  Substance Use Topics   Alcohol use: Not Currently    Comment: once per month will have 1 alcohol unit   Drug use: Not Currently    Types: Marijuana    Comment: previously every 3-6 months but hasn't smoked in a few years   Family History  Problem Relation Age of Onset   Seizures Mother    Arthritis Mother    Diabetes Mother    Hyperlipidemia Mother    Depression Mother    Hyperlipidemia Father    Hypertension Father    Gout Father    Dementia Father    Hypertension Sister    Mental illness Brother    Hyperlipidemia Brother    ADD / ADHD Brother    Depression Brother    Hypertension Maternal Grandmother    Cancer Maternal Grandmother    Hypertension Maternal Grandfather    Thyroid disease Maternal Grandfather    Cancer Maternal Grandfather    Prostate cancer Maternal Grandfather    Anemia Paternal Grandmother    Cancer Paternal Grandmother        cervical   Hypertension Paternal Grandfather    Heart disease Paternal Grandfather  Hypertension Other    Other Other        heart skips-maternal great grandma   Other Other        fibroids- maternal grandma and great grandma   Heart disease Other    Schizophrenia Maternal Aunt    Bipolar disorder Maternal Aunt    Alcohol abuse Maternal Uncle    Allergies  Allergen Reactions   Selenium Sulfide Rash   Banana Other (See Comments)    headache   Motrin [Ibuprofen] Hives and Itching   Sulfa Antibiotics Rash   Review of Systems  Constitutional:  Negative for chills and fever.  HENT:  Negative for sore throat.   Respiratory:  Negative for cough and shortness of breath.   Cardiovascular:  Negative for chest pain, palpitations and leg swelling.   Gastrointestinal:  Negative for abdominal pain, blood in stool, constipation, diarrhea, nausea and vomiting.  Genitourinary:  Negative for dysuria and hematuria.  Musculoskeletal:  Negative for myalgias.  Skin:  Negative for itching and rash.  Neurological:  Negative for dizziness and headaches.  Psychiatric/Behavioral:  Negative for depression and suicidal ideas.      Objective:     BP 121/78 (BP Location: Left Arm, Patient Position: Sitting, Cuff Size: Large)   Pulse (!) 53   Temp 97.9 F (36.6 C) (Oral)   Ht 5\' 5"  (1.651 m)   Wt (!) 317 lb 3.2 oz (143.9 kg)   LMP 04/15/2023 (Exact Date)   SpO2 96%   BMI 52.78 kg/m  BP Readings from Last 3 Encounters:  04/16/23 121/78  02/23/23 132/84  02/10/23 138/82   Physical Exam Vitals reviewed.  Constitutional:      General: She is not in acute distress.    Appearance: Normal appearance. She is obese. She is not toxic-appearing.  HENT:     Head: Normocephalic and atraumatic.     Right Ear: External ear normal.     Left Ear: External ear normal.     Nose: Nose normal. No congestion or rhinorrhea.     Mouth/Throat:     Mouth: Mucous membranes are moist.     Pharynx: Oropharynx is clear. No oropharyngeal exudate or posterior oropharyngeal erythema.  Eyes:     General: No scleral icterus.    Extraocular Movements: Extraocular movements intact.     Conjunctiva/sclera: Conjunctivae normal.     Pupils: Pupils are equal, round, and reactive to light.  Cardiovascular:     Rate and Rhythm: Normal rate and regular rhythm.     Pulses: Normal pulses.     Heart sounds: Normal heart sounds. No murmur heard.    No friction rub. No gallop.  Pulmonary:     Effort: Pulmonary effort is normal.     Breath sounds: Normal breath sounds. No wheezing, rhonchi or rales.  Abdominal:     General: Abdomen is flat. Bowel sounds are normal. There is no distension.     Palpations: Abdomen is soft.     Tenderness: There is no abdominal tenderness.   Musculoskeletal:        General: No swelling. Normal range of motion.     Cervical back: Normal range of motion.     Right lower leg: No edema.     Left lower leg: No edema.  Lymphadenopathy:     Cervical: No cervical adenopathy.  Skin:    General: Skin is warm and dry.     Capillary Refill: Capillary refill takes less than 2 seconds.     Coloration:  Skin is not jaundiced.  Neurological:     General: No focal deficit present.     Mental Status: She is alert and oriented to person, place, and time.  Psychiatric:        Mood and Affect: Mood normal.        Behavior: Behavior normal.   Last CBC Lab Results  Component Value Date   WBC 6.6 09/23/2022   HGB 10.9 (L) 09/23/2022   HCT 35.4 09/23/2022   MCV 67 (L) 09/23/2022   MCH 20.5 (L) 09/23/2022   RDW 15.0 09/23/2022   PLT 424 09/23/2022   Last metabolic panel Lab Results  Component Value Date   GLUCOSE 93 09/23/2022   NA 140 09/23/2022   K 4.7 09/23/2022   CL 104 09/23/2022   CO2 22 09/23/2022   BUN 10 09/23/2022   CREATININE 0.59 09/23/2022   EGFR 130 09/23/2022   CALCIUM 9.7 09/23/2022   PROT 7.4 09/23/2022   ALBUMIN 4.3 09/23/2022   LABGLOB 3.1 09/23/2022   AGRATIO 1.4 09/23/2022   BILITOT 0.2 09/23/2022   ALKPHOS 142 (H) 09/23/2022   AST 16 09/23/2022   ALT 19 09/23/2022   ANIONGAP 8 09/23/2021   Last lipids Lab Results  Component Value Date   CHOL 218 (H) 09/23/2022   HDL 70 09/23/2022   LDLCALC 134 (H) 09/23/2022   TRIG 78 09/23/2022   CHOLHDL 3.1 09/23/2022   Last hemoglobin A1c Lab Results  Component Value Date   HGBA1C 5.8 10/28/2022   Last thyroid functions Lab Results  Component Value Date   TSH 2.070 09/23/2022   Last vitamin D Lab Results  Component Value Date   VD25OH 20.0 (L) 09/23/2022   Last vitamin B12 and Folate Lab Results  Component Value Date   VITAMINB12 401 05/23/2021     Assessment & Plan:   Problem List Items Addressed This Visit       Recurrent cold sores     Valtrex refilled today      Alpha thalassemia trait - Primary    Patient requesting hematology referral for confirmation of previously given diagnosis. -Hematology referral placed today      Morbid obesity [facility])    She has switched to Zepbound since her last appointment.  Currently prescribed 5 mg weekly.  Her weight today is 317 pounds, which is down 10 pounds since her last appointment.  She is pleased with her progress and is not experiencing any significant adverse side effects. -Increase Zepbound to 7.5 mg weekly      Return in about 3 months (around 07/17/2023).   Billie Lade, MD

## 2023-04-21 ENCOUNTER — Other Ambulatory Visit (HOSPITAL_COMMUNITY): Payer: Self-pay | Admitting: Psychiatry

## 2023-04-21 ENCOUNTER — Encounter: Payer: Self-pay | Admitting: Internal Medicine

## 2023-04-21 DIAGNOSIS — Z6841 Body Mass Index (BMI) 40.0 and over, adult: Secondary | ICD-10-CM

## 2023-04-21 DIAGNOSIS — F250 Schizoaffective disorder, bipolar type: Secondary | ICD-10-CM

## 2023-04-21 NOTE — Assessment & Plan Note (Signed)
She has switched to Zepbound since her last appointment.  Currently prescribed 5 mg weekly.  Her weight today is 317 pounds, which is down 10 pounds since her last appointment.  She is pleased with her progress and is not experiencing any significant adverse side effects. -Increase Zepbound to 7.5 mg weekly

## 2023-04-21 NOTE — Assessment & Plan Note (Signed)
Patient requesting hematology referral for confirmation of previously given diagnosis. -Hematology referral placed today

## 2023-04-21 NOTE — Assessment & Plan Note (Signed)
Valtrex refilled today 

## 2023-04-24 ENCOUNTER — Telehealth (INDEPENDENT_AMBULATORY_CARE_PROVIDER_SITE_OTHER): Payer: MEDICAID | Admitting: Psychiatry

## 2023-04-24 ENCOUNTER — Encounter (HOSPITAL_COMMUNITY): Payer: Self-pay | Admitting: Psychiatry

## 2023-04-24 DIAGNOSIS — F4310 Post-traumatic stress disorder, unspecified: Secondary | ICD-10-CM

## 2023-04-24 DIAGNOSIS — F900 Attention-deficit hyperactivity disorder, predominantly inattentive type: Secondary | ICD-10-CM | POA: Diagnosis not present

## 2023-04-24 DIAGNOSIS — F250 Schizoaffective disorder, bipolar type: Secondary | ICD-10-CM

## 2023-04-24 DIAGNOSIS — N943 Premenstrual tension syndrome: Secondary | ICD-10-CM | POA: Diagnosis not present

## 2023-04-24 DIAGNOSIS — Z79899 Other long term (current) drug therapy: Secondary | ICD-10-CM

## 2023-04-24 MED ORDER — ATOMOXETINE HCL 40 MG PO CAPS
ORAL_CAPSULE | ORAL | 0 refills | Status: DC
Start: 2023-04-24 — End: 2023-04-29

## 2023-04-24 NOTE — Patient Instructions (Signed)
We decreased the atomoxetine (Strattera) to 80 mg once daily for 1 week.  Then you will decrease to 40 mg once daily for 1 week.  Then you will discontinue.  When we see each other next we may try a low dose of Vyvanse again.
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
## 2023-04-27 ENCOUNTER — Encounter (HOSPITAL_COMMUNITY): Payer: Self-pay

## 2023-04-29 ENCOUNTER — Telehealth (HOSPITAL_COMMUNITY): Payer: Self-pay | Admitting: *Deleted

## 2023-04-29 DIAGNOSIS — F900 Attention-deficit hyperactivity disorder, predominantly inattentive type: Secondary | ICD-10-CM

## 2023-04-29 MED ORDER — ATOMOXETINE HCL 40 MG PO CAPS
40.0000 mg | ORAL_CAPSULE | Freq: Every day | ORAL | 0 refills | Status: AC
Start: 2023-04-29 — End: 2023-05-06

## 2023-04-29 MED ORDER — ATOMOXETINE HCL 80 MG PO CAPS
80.0000 mg | ORAL_CAPSULE | Freq: Every day | ORAL | 0 refills | Status: AC
Start: 2023-04-29 — End: 2023-05-06

## 2023-04-29 NOTE — Telephone Encounter (Addendum)
Patient pharmacy sent fax stating that insurance only pays for 1 daily dosing. Per fax, "with these directions you need to send two separate Rx so insurance can cover." Per fax, they would like for provider to send 80mg  once daily for 1 week and then 40mg  once daily."   Prescription amended to reflect the above.

## 2023-04-29 NOTE — Telephone Encounter (Signed)
Called patient and informed her with message and that staff completed PA for her Atomoxetine. Patient verbalized understanding

## 2023-04-29 NOTE — Addendum Note (Signed)
Addended by: Tia Masker on: 04/29/2023 09:50 AM   Modules accepted: Orders

## 2023-05-03 ENCOUNTER — Other Ambulatory Visit (HOSPITAL_COMMUNITY): Payer: Self-pay | Admitting: Psychiatry

## 2023-05-03 DIAGNOSIS — F900 Attention-deficit hyperactivity disorder, predominantly inattentive type: Secondary | ICD-10-CM

## 2023-05-07 ENCOUNTER — Telehealth: Payer: Self-pay | Admitting: Internal Medicine

## 2023-05-07 NOTE — Telephone Encounter (Signed)
Patient LVM wanting to speak with provider regarding some questions she has about her Zepbound. Please advise Thank you

## 2023-05-11 MED ORDER — ZEPBOUND 7.5 MG/0.5ML ~~LOC~~ SOAJ
7.5000 mg | SUBCUTANEOUS | 0 refills | Status: DC
Start: 2023-05-11 — End: 2023-06-01

## 2023-05-21 ENCOUNTER — Telehealth (HOSPITAL_COMMUNITY): Payer: MEDICAID | Admitting: Psychiatry

## 2023-05-21 ENCOUNTER — Encounter (HOSPITAL_COMMUNITY): Payer: Self-pay | Admitting: Psychiatry

## 2023-05-21 DIAGNOSIS — F411 Generalized anxiety disorder: Secondary | ICD-10-CM

## 2023-05-21 DIAGNOSIS — F4310 Post-traumatic stress disorder, unspecified: Secondary | ICD-10-CM

## 2023-05-21 DIAGNOSIS — N943 Premenstrual tension syndrome: Secondary | ICD-10-CM | POA: Diagnosis not present

## 2023-05-21 DIAGNOSIS — F250 Schizoaffective disorder, bipolar type: Secondary | ICD-10-CM

## 2023-05-21 DIAGNOSIS — F900 Attention-deficit hyperactivity disorder, predominantly inattentive type: Secondary | ICD-10-CM | POA: Diagnosis not present

## 2023-05-21 DIAGNOSIS — Z79899 Other long term (current) drug therapy: Secondary | ICD-10-CM

## 2023-05-21 MED ORDER — LISDEXAMFETAMINE DIMESYLATE 10 MG PO CAPS: 10.0000 mg | ORAL_CAPSULE | Freq: Every day | ORAL | 0 refills | Status: DC
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
## 2023-05-21 NOTE — Patient Instructions (Signed)
We started vyvanse (lisdexamfetamine) 10mg  once daily today. If you have ANY return of mania or psychosis please stop the vyvanse immediately and let our office know as we may need to briefly increase the lurasidone to treat. Please contact your PCP to get an updated EKG, I agree doing this before your insurance change may be ideal.

## 2023-05-25 ENCOUNTER — Encounter (HOSPITAL_COMMUNITY): Payer: Self-pay

## 2023-05-26 ENCOUNTER — Ambulatory Visit (INDEPENDENT_AMBULATORY_CARE_PROVIDER_SITE_OTHER): Payer: MEDICAID | Admitting: Adult Health

## 2023-05-26 ENCOUNTER — Encounter: Payer: Self-pay | Admitting: Adult Health

## 2023-05-26 VITALS — BP 119/77 | HR 93 | Ht 65.0 in | Wt 320.0 lb

## 2023-05-26 DIAGNOSIS — Z3041 Encounter for surveillance of contraceptive pills: Secondary | ICD-10-CM

## 2023-05-26 MED ORDER — BLISOVI 24 FE 1-20 MG-MCG(24) PO TABS: 1.0000 | ORAL_TABLET | Freq: Every day | ORAL | 12 refills | Status: DC

## 2023-05-26 NOTE — Progress Notes (Signed)
  Subjective:     Patient ID: Kathy Rios, female   DOB: 08-Sep-2000, 22 y.o.   MRN: [id number]  HPI Ronne Binning is a 22 year old black female,single, G0P0, back in follow up on starting Blisovi and for BP check.     Component Value Date/Time   DIAGPAP  04/29/2021 1136    - Negative for intraepithelial lesion or malignancy (NILM)   ADEQPAP  04/29/2021 1136    Satisfactory for evaluation; transformation zone component PRESENT.    PCP is Dr Durwin Nora  Review of Systems Periods good No headaches Reviewed past medical,surgical, social and family history. Reviewed medications and allergies.     Objective:   Physical Exam BP 119/77 (BP Location: Left Arm, Patient Position: Sitting, Cuff Size: Normal)   Pulse 93   Ht 5\' 5"  (1.651 m)   Wt (!) 320 lb (145.2 kg)   LMP 05/13/2023   BMI 53.25 kg/m     Skin warm and dry.  Lungs: clear to ausculation bilaterally. Cardiovascular: regular rate and rhythm.  Fall risk is low  Upstream - 05/26/23 1131       Pregnancy Intention Screening   Does the patient want to become pregnant in the next year? No    Does the patient's partner want to become pregnant in the next year? No    Would the patient like to discuss contraceptive options today? No      Contraception Wrap Up   Current Method Oral Contraceptive    End Method Oral Contraceptive    Contraception Counseling Provided Yes             Assessment:     1. Encounter for surveillance of contraceptive pills Continue BCP, will refill Meds ordered this encounter  Medications   Norethindrone Acetate-Ethinyl Estrad-FE (BLISOVI 24 FE) 1-20 MG-MCG(24) tablet    Sig: Take 1 tablet by mouth daily.    Dispense:  28 tablet    Refill:  12    Order Specific Question:   Supervising Provider    Answer:   Lazaro Arms [[id number]]       Plan:     Return in 1 year for pap and physical

## 2023-06-01 ENCOUNTER — Encounter (HOSPITAL_COMMUNITY): Payer: Self-pay

## 2023-06-01 ENCOUNTER — Encounter (HOSPITAL_COMMUNITY): Payer: Self-pay | Admitting: Psychiatry

## 2023-06-01 ENCOUNTER — Telehealth (HOSPITAL_COMMUNITY): Payer: MEDICAID | Admitting: Psychiatry

## 2023-06-01 ENCOUNTER — Encounter: Payer: Self-pay | Admitting: Internal Medicine

## 2023-06-01 ENCOUNTER — Telehealth (INDEPENDENT_AMBULATORY_CARE_PROVIDER_SITE_OTHER): Payer: MEDICAID | Admitting: Psychiatry

## 2023-06-01 DIAGNOSIS — F900 Attention-deficit hyperactivity disorder, predominantly inattentive type: Secondary | ICD-10-CM

## 2023-06-01 DIAGNOSIS — F250 Schizoaffective disorder, bipolar type: Secondary | ICD-10-CM

## 2023-06-01 DIAGNOSIS — Z6841 Body Mass Index (BMI) 40.0 and over, adult: Secondary | ICD-10-CM

## 2023-06-01 DIAGNOSIS — F4310 Post-traumatic stress disorder, unspecified: Secondary | ICD-10-CM

## 2023-06-01 DIAGNOSIS — Z79899 Other long term (current) drug therapy: Secondary | ICD-10-CM

## 2023-06-01 MED ORDER — ZEPBOUND 10 MG/0.5ML ~~LOC~~ SOAJ: 10.0000 mg | SUBCUTANEOUS | 0 refills | Status: DC

## 2023-06-01 MED ORDER — LISDEXAMFETAMINE DIMESYLATE 30 MG PO CAPS: 30.0000 mg | ORAL_CAPSULE | Freq: Every day | ORAL | 0 refills | Status: DC

## 2023-06-01 NOTE — Addendum Note (Signed)
Addended by: Christel Mormon E on: 06/01/2023 08:57 AM   Modules accepted: Orders
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
## 2023-06-01 NOTE — Patient Instructions (Signed)
We increase the vyvnase to 20mg  daily today. Your 30mg  dose will be available on 06/08/23 and you can start taking that dose then. We will plan on meeting 06/16/23 to see how that dose is going. I will coordinate with your PCP about getting a urine drug screen, EKG, lipid panel, and A1c for monitoring while you are on vyvanse and abilify respectively.

## 2023-06-08 ENCOUNTER — Inpatient Hospital Stay: Payer: MEDICAID | Admitting: Hematology

## 2023-06-08 ENCOUNTER — Inpatient Hospital Stay: Payer: MEDICAID

## 2023-06-08 ENCOUNTER — Inpatient Hospital Stay: Payer: MEDICAID | Attending: Hematology | Admitting: Hematology

## 2023-06-08 ENCOUNTER — Other Ambulatory Visit: Payer: MEDICAID

## 2023-06-08 VITALS — BP 134/98 | HR 104 | Temp 97.9°F | Resp 18 | Wt 316.0 lb

## 2023-06-08 DIAGNOSIS — Z79899 Other long term (current) drug therapy: Secondary | ICD-10-CM | POA: Diagnosis not present

## 2023-06-08 DIAGNOSIS — R718 Other abnormality of red blood cells: Secondary | ICD-10-CM | POA: Diagnosis not present

## 2023-06-08 DIAGNOSIS — D649 Anemia, unspecified: Secondary | ICD-10-CM | POA: Diagnosis not present

## 2023-06-08 DIAGNOSIS — Z808 Family history of malignant neoplasm of other organs or systems: Secondary | ICD-10-CM | POA: Insufficient documentation

## 2023-06-08 DIAGNOSIS — Z809 Family history of malignant neoplasm, unspecified: Secondary | ICD-10-CM | POA: Diagnosis not present

## 2023-06-08 DIAGNOSIS — Z8042 Family history of malignant neoplasm of prostate: Secondary | ICD-10-CM | POA: Insufficient documentation

## 2023-06-08 DIAGNOSIS — D563 Thalassemia minor: Secondary | ICD-10-CM | POA: Insufficient documentation

## 2023-06-08 LAB — CBC WITH DIFFERENTIAL/PLATELET
Abs Immature Granulocytes: 0.01 10*3/uL (ref 0.00–0.07)
Basophils Absolute: 0.1 10*3/uL (ref 0.0–0.1)
Basophils Relative: 1 %
Eosinophils Absolute: 0.1 10*3/uL (ref 0.0–0.5)
Eosinophils Relative: 1 %
HCT: 36.3 % (ref 36.0–46.0)
Hemoglobin: 10.8 g/dL — ABNORMAL LOW (ref 12.0–15.0)
Immature Granulocytes: 0 %
Lymphocytes Relative: 29 %
Lymphs Abs: 2.4 10*3/uL (ref 0.7–4.0)
MCH: 20 pg — ABNORMAL LOW (ref 26.0–34.0)
MCHC: 29.8 g/dL — ABNORMAL LOW (ref 30.0–36.0)
MCV: 67.1 fL — ABNORMAL LOW (ref 80.0–100.0)
Monocytes Absolute: 0.7 10*3/uL (ref 0.1–1.0)
Monocytes Relative: 9 %
Neutro Abs: 5 10*3/uL (ref 1.7–7.7)
Neutrophils Relative %: 60 %
Platelets: 403 10*3/uL — ABNORMAL HIGH (ref 150–400)
RBC: 5.41 MIL/uL — ABNORMAL HIGH (ref 3.87–5.11)
RDW: 18.8 % — ABNORMAL HIGH (ref 11.5–15.5)
WBC: 8.2 10*3/uL (ref 4.0–10.5)
nRBC: 0 % (ref 0.0–0.2)

## 2023-06-08 LAB — FOLATE: Folate: 19.9 ng/mL (ref >5.9)

## 2023-06-08 LAB — FERRITIN: Ferritin: 116 ng/mL (ref 11–307)

## 2023-06-08 LAB — IRON AND TIBC
Iron: 36 ug/dL (ref 28–170)
Saturation Ratios: 10 % — ABNORMAL LOW (ref 10.4–31.8)
TIBC: 379 ug/dL (ref 250–450)
UIBC: 343 ug/dL

## 2023-06-08 LAB — RETICULOCYTES
Immature Retic Fract: 17.6 % — ABNORMAL HIGH (ref 2.3–15.9)
RBC.: 5.43 MIL/uL — ABNORMAL HIGH (ref 3.87–5.11)
Retic Count, Absolute: 56.5 10*3/uL (ref 19.0–186.0)
Retic Ct Pct: 1 % (ref 0.4–3.1)

## 2023-06-08 LAB — VITAMIN B12: Vitamin B-12: 391 pg/mL (ref 180–914)

## 2023-06-08 LAB — LACTATE DEHYDROGENASE: LDH: 132 U/L (ref 98–192)

## 2023-06-08 NOTE — Progress Notes (Signed)
[facility] [address], [place] [address]   Clinic Day:  06/08/2023  Referring physician: Billie Lade, MD  Patient Care Team: Billie Lade, MD as PCP - General (Internal Medicine)   ASSESSMENT & PLAN:   Assessment:  1.  Mild anemia with severe microcytosis: - CBC (09/23/2022): Hb-10.9, MCV-67. - Denies previous history of blood transfusion. 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  I do not have records to review. - She has a history of menorrhagia, on OCP since age 50, with improvement in menstrual bleeding.   2.  Social/family history: - She is a non-smoker. - Mother is alpha thalassemia carrier.  Maternal grandfather had prostate cancer.  Paternal grandmother had cervical cancer.  Plan:  1.  Mild anemia with severe microcytosis: - Will repeat CBC today and check for nutritional deficiencies. - Will send hemoglobin electrophoresis and globin gene testing for alpha thalassemia. - RTC 3 weeks for follow-up.   Orders Placed This Encounter  Procedures   CBC with Differential    Standing Status:   Future    Number of Occurrences:   1    Standing Expiration Date:   06/07/2024   Reticulocytes    Standing Status:   Future    Number of Occurrences:   1    Standing Expiration Date:   06/07/2024   Lactate dehydrogenase    Standing Status:   Future    Number of Occurrences:   1    Standing Expiration Date:   06/07/2024   Copper, serum    Standing Status:   Future    Number of Occurrences:   1    Standing Expiration Date:   06/07/2024   Hgb Fractionation Cascade    Standing Status:   Future    Number of Occurrences:   1    Standing Expiration Date:   06/07/2024   Iron and TIBC ([facility] ONLY)    Standing Status:   Future    Number of Occurrences:   1    Standing Expiration Date:   06/07/2024   Ferritin    Standing Status:   Future    Number of Occurrences:   1     Standing Expiration Date:   06/07/2024   Vitamin B12    Standing Status:   Future    Number of Occurrences:   1    Standing Expiration Date:   06/07/2024   Folate    Standing Status:   Future    Number of Occurrences:   1    Standing Expiration Date:   06/07/2024   Methylmalonic acid, serum    Standing Status:   Future    Number of Occurrences:   1    Standing Expiration Date:   06/07/2024   Miscellaneous [facility] test (send-out)    Standing Status:   Future    Standing Expiration Date:   06/07/2024    Order Specific Question:   Test name / description:    Answer:   alpha thalassemia DNA analysis - Test #[id number]   Alpha-Thalassemia GenotypR    Standing Status:   Future    Number of Occurrences:   1    Standing Expiration Date:   06/07/2024      Alben Deeds Teague,acting as a scribe for Doreatha Massed, MD.,have documented all relevant documentation on the behalf of Doreatha Massed, MD,as directed by  Doreatha Massed, MD  while in the presence of Doreatha Massed, MD.   I, Doreatha Massed MD, have reviewed the above documentation for accuracy and completeness, and I agree with the above.   Doreatha Massed, MD   10/21/20243:48 PM  CHIEF COMPLAINT/PURPOSE OF CONSULT:   Diagnosis: Mild anemia with severe microcytosis  Current Therapy: Under workup  HISTORY OF PRESENT ILLNESS:   Kathy Rios is a 22 y.o. female presenting to clinic today for evaluation of alpha thalassemia trait at the request of Dr. Durwin Nora.  Today, she states that she is doing well overall. Her appetite level is at 75%. Her energy level is at 70%.  Patient requested referral to me at her last visit with Dr. Durwin Nora on 04/16/23 for clarification and counseling on prior diagnosis of alpha thalassemia trait. She was seen by pediatric hematology and oncology on 03/28/11 at [facility]. Von Willebrand panel from 03/28/11 showed elevated von willebrand factor activity at 237. Platelet function assay  from 03/28/11 showed elevated COL/ADP FXN screen at 111.     PAST MEDICAL HISTORY:   Past Medical History: Past Medical History:  Diagnosis Date   ADD (attention deficit disorder)    ADHD (attention deficit hyperactivity disorder)    Anemia    Anxiety    Chronic constipation 03/08/2018   Depression    Diabetes mellitus    Diabetes mellitus, type II (HCC)    Encounter for menstrual regulation 01/25/2016   Generalized anxiety disorder 07/03/2022   Hypertension    Insomnia 06/27/2022   Irregular periods 10/25/2015   Major depressive disorder 03/22/2018   Obesity    Patient desires pregnancy 08/25/2022   PMS (premenstrual syndrome) 11/05/2015   Possible pregnancy 08/19/2022   PTSD (post-traumatic stress disorder)    Reactive hypoglycemia    Schizoaffective disorder, depressive type (HCC)    Screening examination for STD (sexually transmitted disease) 12/18/2021   Severe menstrual cramps 11/05/2015   Thalassemia trait    Thalassemia trait, alpha     Surgical History: Past Surgical History:  Procedure Laterality Date   ADENOIDECTOMY     TONSILLECTOMY     WISDOM TOOTH EXTRACTION  07/2016    Social History: Social History   Socioeconomic History   Marital status: Single    Spouse name: Not on file   Number of children: Not on file   Years of education: Not on file   Highest education level: 12th grade  Occupational History   Not on file  Tobacco Use   Smoking status: Never   Smokeless tobacco: Never  Vaping Use   Vaping status: Never Used  Substance and Sexual Activity   Alcohol use: Not Currently    Comment: once per month will have 1 alcohol unit   Drug use: Not Currently    Types: Marijuana    Comment: previously every 3-6 months but hasn't smoked in a few years   Sexual activity: Not Currently    Birth control/protection: Pill, Abstinence  Other Topics Concern   Not on file  Social History Narrative   Not on file   Social Determinants of Health    Financial Resource Strain: Low Risk  (11/13/2022)   Overall Financial Resource Strain (CARDIA)    Difficulty of Paying Living Expenses: Not hard at all  Food Insecurity: No Food Insecurity (11/13/2022)   Hunger Vital Sign    Worried About Running Out of Food in the Last Year: Never true    Ran Out of Food in the Last Year: Never true  Transportation Needs:  No Transportation Needs (11/13/2022)   PRAPARE - Administrator, Civil Service (Medical): No    Lack of Transportation (Non-Medical): No  Physical Activity: Sufficiently Active (11/13/2022)   Exercise Vital Sign    Days of Exercise per Week: 3 days    Minutes of Exercise per Session: 60 min  Stress: Stress Concern Present (11/13/2022)   [employer] of Occupational Health - Occupational Stress Questionnaire    Feeling of Stress : To some extent  Social Connections: Moderately Integrated (11/13/2022)   Social Connection and Isolation Panel [NHANES]    Frequency of Communication with Friends and Family: Three times a week    Frequency of Social Gatherings with Friends and Family: Once a week    Attends Religious Services: 1 to 4 times per year    Active Member of [employer] or Organizations: Yes    Attends Engineer, structural: More than 4 times per year    Marital Status: Never married  Intimate Partner Violence: Not At Risk (05/12/2022)   Humiliation, Afraid, Rape, and Kick questionnaire    Fear of Current or Ex-Partner: No    Emotionally Abused: No    Physically Abused: No    Sexually Abused: No    Family History: Family History  Problem Relation Age of Onset   Seizures Mother    Arthritis Mother    Diabetes Mother    Hyperlipidemia Mother    Depression Mother    Hyperlipidemia Father    Hypertension Father    Gout Father    Dementia Father    Hypertension Sister    Mental illness Brother    Hyperlipidemia Brother    ADD / ADHD Brother    Depression Brother    Hypertension Maternal Grandmother     Cancer Maternal Grandmother    Hypertension Maternal Grandfather    Thyroid disease Maternal Grandfather    Cancer Maternal Grandfather    Prostate cancer Maternal Grandfather    Anemia Paternal Grandmother    Cancer Paternal Grandmother        cervical   Hypertension Paternal Grandfather    Heart disease Paternal Grandfather    Hypertension Other    Other Other        heart skips-maternal great grandma   Other Other        fibroids- maternal grandma and great grandma   Heart disease Other    Schizophrenia Maternal Aunt    Bipolar disorder Maternal Aunt    Alcohol abuse Maternal Uncle     Current Medications:  Current Outpatient Medications:    acetaminophen (TYLENOL) 500 MG tablet, Take 1,000 mg by mouth every 6 (six) hours as needed for mild pain or headache., Disp: , Rfl:    b complex vitamins capsule, Take 1 capsule by mouth., Disp: , Rfl:    cloNIDine HCl (KAPVAY) 0.1 MG TB12 ER tablet, Take 1 tablet (0.1 mg total) by mouth at bedtime., Disp: 90 tablet, Rfl: 1   lisdexamfetamine (VYVANSE) 30 MG capsule, Take 1 capsule (30 mg total) by mouth daily., Disp: 30 capsule, Rfl: 0   lurasidone (LATUDA) 80 MG TABS tablet, Take 1 tablet (80 mg total) by mouth at bedtime. With food., Disp: 30 tablet, Rfl: 2   Norethindrone Acetate-Ethinyl Estrad-FE (BLISOVI 24 FE) 1-20 MG-MCG(24) tablet, Take 1 tablet by mouth daily., Disp: 28 tablet, Rfl: 12   olmesartan (BENICAR) 40 MG tablet, TAKE 1 TABLET(40 MG) BY MOUTH DAILY, Disp: 30 tablet, Rfl: 2   prenatal vitamin w/FE,  FA (PRENATAL 1 + 1) 27-1 MG TABS tablet, Take 1 tablet by mouth daily at 12 noon., Disp: 30 tablet, Rfl: 12   tirzepatide (ZEPBOUND) 10 MG/0.5ML Pen, Inject 10 mg into the skin once a week., Disp: 6 mL, Rfl: 0   UNABLE TO FIND, Omega 3-takes 1 tab daily, Disp: , Rfl:    Vitamin D, Ergocalciferol, (DRISDOL) 1.25 MG (50000 UNIT) CAPS capsule, Take 1 capsule (50,000 Units total) by mouth every 7 (seven) days., Disp: 10 capsule,  Rfl: 1   Allergies: Allergies  Allergen Reactions   Selenium Sulfide Rash   Banana Other (See Comments)    headache   Motrin [Ibuprofen] Hives and Itching   Sulfa Antibiotics Rash    REVIEW OF SYSTEMS:   Review of Systems  Constitutional:  Negative for chills, fatigue and fever.  HENT:   Negative for lump/mass, mouth sores, nosebleeds, sore throat and trouble swallowing.   Eyes:  Negative for eye problems.  Respiratory:  Negative for cough and shortness of breath.   Cardiovascular:  Positive for palpitations. Negative for chest pain and leg swelling.  Gastrointestinal:  Positive for nausea. Negative for abdominal pain, constipation, diarrhea and vomiting.  Genitourinary:  Negative for bladder incontinence, difficulty urinating, dysuria, frequency, hematuria and nocturia.   Musculoskeletal:  Positive for back pain. Negative for arthralgias, flank pain, myalgias and neck pain.  Skin:  Negative for itching and rash.  Neurological:  Negative for dizziness, headaches and numbness.  Hematological:  Does not bruise/bleed easily.  Psychiatric/Behavioral:  Negative for depression, sleep disturbance and suicidal ideas. The patient is not nervous/anxious.   All other systems reviewed and are negative.    VITALS:   Blood pressure (!) 134/98, pulse (!) 104, temperature 97.9 F (36.6 C), resp. rate 18, weight (!) 316 lb (143.3 kg), last menstrual period 05/13/2023, SpO2 99%.  Wt Readings from Last 3 Encounters:  06/08/23 (!) 316 lb (143.3 kg)  05/26/23 (!) 320 lb (145.2 kg)  04/16/23 (!) 317 lb 3.2 oz (143.9 kg)    Body mass index is 52.59 kg/m.   PHYSICAL EXAM:   Physical Exam Vitals and nursing note reviewed. Exam conducted with a chaperone present.  Constitutional:      Appearance: Normal appearance.  Cardiovascular:     Rate and Rhythm: Normal rate and regular rhythm.     Pulses: Normal pulses.     Heart sounds: Normal heart sounds.  Pulmonary:     Effort: Pulmonary effort  is normal.     Breath sounds: Normal breath sounds.  Abdominal:     Palpations: Abdomen is soft. There is no hepatomegaly, splenomegaly or mass.     Tenderness: There is no abdominal tenderness.  Musculoskeletal:     Right lower leg: No edema.     Left lower leg: No edema.  Lymphadenopathy:     Cervical: No cervical adenopathy.     Right cervical: No superficial, deep or posterior cervical adenopathy.    Left cervical: No superficial, deep or posterior cervical adenopathy.     Upper Body:     Right upper body: No supraclavicular or axillary adenopathy.     Left upper body: No supraclavicular or axillary adenopathy.  Neurological:     General: No focal deficit present.     Mental Status: She is alert and oriented to person, place, and time.  Psychiatric:        Mood and Affect: Mood normal.        Behavior: Behavior normal.  LABS:      Latest Ref Rng & Units 06/08/2023    2:22 PM 09/23/2022    9:32 AM 06/30/2022   10:39 AM  CBC  WBC 4.0 - 10.5 K/uL 8.2  6.6  6.1   Hemoglobin 12.0 - 15.0 g/dL 41.3  24.4  01.0   Hematocrit 36.0 - 46.0 % 36.3  35.4  34.6   Platelets 150 - 400 K/uL 403  424  403       Latest Ref Rng & Units 09/23/2022    9:32 AM 06/30/2022   10:39 AM 09/23/2021    6:24 PM  CMP  Glucose 70 - 99 mg/dL 93  98  272   BUN 6 - 20 mg/dL 10  11  11    Creatinine 0.57 - 1.00 mg/dL 5.36  6.44  0.34   Sodium 134 - 144 mmol/L 140  136  135   Potassium 3.5 - 5.2 mmol/L 4.7  5.0  4.0   Chloride 96 - 106 mmol/L 104  104  104   CO2 20 - 29 mmol/L 22  21  23    Calcium 8.7 - 10.2 mg/dL 9.7  9.1  9.3   Total Protein 6.0 - 8.5 g/dL 7.4  7.5  8.1   Total Bilirubin 0.0 - 1.2 mg/dL 0.2  <7.4  0.1   Alkaline Phos 44 - 121 IU/L 142  117  75   AST 0 - 40 IU/L 16  12  17    ALT 0 - 32 IU/L 19  18  24       No results found for: "CEA1", "CEA" / No results found for: "CEA1", "CEA" No results found for: "PSA1" No results found for: "QVZ563" No results found for: "CAN125"  No  results found for: "TOTALPROTELP", "ALBUMINELP", "A1GS", "A2GS", "BETS", "BETA2SER", "GAMS", "MSPIKE", "SPEI" Lab Results  Component Value Date   TIBC WILL FOLLOW 05/23/2021   FERRITIN 98 05/23/2021   IRONPCTSAT WILL FOLLOW 05/23/2021   Lab Results  Component Value Date   LDH 132 06/08/2023     STUDIES:   No results found.

## 2023-06-08 NOTE — Patient Instructions (Addendum)
You were seen and examined today by Dr. Ellin Saba. Dr. Ellin Saba is a hematologist, meaning that he specializes in blood abnormalities. Dr. Ellin Saba discussed your past medical history, family history of cancers/blood conditions and the events that led to you being here today.  You were referred to Dr. Ellin Saba due to anemia in the setting of alpha thalassemia trait.  Dr. Ellin Saba has recommended additional labs today for further evaluation.  Follow-up as scheduled.

## 2023-06-09 LAB — HGB FRACTIONATION CASCADE
Hgb A2: 2.4 % (ref 1.8–3.2)
Hgb A: 97.6 % (ref 96.4–98.8)
Hgb F: 0 % (ref 0.0–2.0)
Hgb S: 0 %

## 2023-06-11 ENCOUNTER — Ambulatory Visit (INDEPENDENT_AMBULATORY_CARE_PROVIDER_SITE_OTHER): Payer: MEDICAID | Admitting: Family Medicine

## 2023-06-11 ENCOUNTER — Encounter: Payer: Self-pay | Admitting: Family Medicine

## 2023-06-11 ENCOUNTER — Telehealth (HOSPITAL_COMMUNITY): Payer: MEDICAID | Admitting: Psychiatry

## 2023-06-11 VITALS — BP 126/81 | HR 81 | Resp 16 | Ht 65.0 in | Wt 317.8 lb

## 2023-06-11 DIAGNOSIS — Z79899 Other long term (current) drug therapy: Secondary | ICD-10-CM

## 2023-06-11 LAB — METHYLMALONIC ACID, SERUM: Methylmalonic Acid, Quantitative: 90 nmol/L (ref 0–378)

## 2023-06-11 NOTE — Progress Notes (Signed)
Established Patient Office Visit  Subjective:  Patient ID: Kathy Rios, female    DOB: May 06, 2001  Age: 22 y.o. MRN: [id number]  CC:  Chief Complaint  Patient presents with   Follow-up    HPI Kathy Rios is a 22 y.o. female with past medical history of long-term current use of antipsychotic presents for f/u.  Long-term Current Use of Antipsychotic:I reviewed the patient's recent encounter with her psychiatrist, Dr. Adrian Blackwater, on 06/01/2023. Dr. Adrian Blackwater requested an EKG, lipid panel, hemoglobin A1c, and urine drug screening due to the patient's long-term use of antipsychotic medications. The patient admits to occasionally experiencing palpitations.  In the clinic today, her PHQ-9 score is 11, and she reports having several days where she has thoughts that she would be better off dead or hurting herself in some way. Upon further inquiry, the patient admitted to having suicidal thoughts at least twice weekly, though she denies having suicidal thoughts today in the clinic.   Past Medical History:  Diagnosis Date   ADD (attention deficit disorder)    ADHD (attention deficit hyperactivity disorder)    Anemia    Anxiety    Chronic constipation 03/08/2018   Depression    Diabetes mellitus    Diabetes mellitus, type II (HCC)    Encounter for menstrual regulation 01/25/2016   Generalized anxiety disorder 07/03/2022   Hypertension    Insomnia 06/27/2022   Irregular periods 10/25/2015   Major depressive disorder 03/22/2018   Obesity    Patient desires pregnancy 08/25/2022   PMS (premenstrual syndrome) 11/05/2015   Possible pregnancy 08/19/2022   PTSD (post-traumatic stress disorder)    Reactive hypoglycemia    Schizoaffective disorder, depressive type (HCC)    Screening examination for STD (sexually transmitted disease) 12/18/2021   Severe menstrual cramps 11/05/2015   Thalassemia trait    Thalassemia trait, alpha     Past Surgical History:  Procedure Laterality Date    ADENOIDECTOMY     TONSILLECTOMY     WISDOM TOOTH EXTRACTION  07/2016    Family History  Problem Relation Age of Onset   Seizures Mother    Arthritis Mother    Diabetes Mother    Hyperlipidemia Mother    Depression Mother    Hyperlipidemia Father    Hypertension Father    Gout Father    Dementia Father    Hypertension Sister    Mental illness Brother    Hyperlipidemia Brother    ADD / ADHD Brother    Depression Brother    Hypertension Maternal Grandmother    Cancer Maternal Grandmother    Hypertension Maternal Grandfather    Thyroid disease Maternal Grandfather    Cancer Maternal Grandfather    Prostate cancer Maternal Grandfather    Anemia Paternal Grandmother    Cancer Paternal Grandmother        cervical   Hypertension Paternal Grandfather    Heart disease Paternal Grandfather    Hypertension Other    Other Other        heart skips-maternal great grandma   Other Other        fibroids- maternal grandma and great grandma   Heart disease Other    Schizophrenia Maternal Aunt    Bipolar disorder Maternal Aunt    Alcohol abuse Maternal Uncle     Social History   Socioeconomic History   Marital status: Single    Spouse name: Not on file   Number of children: Not on file   Years of education: Not on file  Highest education level: Some college, no degree  Occupational History   Not on file  Tobacco Use   Smoking status: Never   Smokeless tobacco: Never  Vaping Use   Vaping status: Never Used  Substance and Sexual Activity   Alcohol use: Not Currently    Comment: once per month will have 1 alcohol unit   Drug use: Not Currently    Types: Marijuana    Comment: previously every 3-6 months but hasn't smoked in a few years   Sexual activity: Not Currently    Birth control/protection: Pill, Abstinence  Other Topics Concern   Not on file  Social History Narrative   Not on file   Social Determinants of Health   Financial Resource Strain: Medium Risk  (06/09/2023)   Overall Financial Resource Strain (CARDIA)    Difficulty of Paying Living Expenses: Somewhat hard  Food Insecurity: No Food Insecurity (06/09/2023)   Hunger Vital Sign    Worried About Running Out of Food in the Last Year: Never true    Ran Out of Food in the Last Year: Never true  Transportation Needs: No Transportation Needs (06/09/2023)   PRAPARE - Administrator, Civil Service (Medical): No    Lack of Transportation (Non-Medical): No  Physical Activity: Sufficiently Active (06/09/2023)   Exercise Vital Sign    Days of Exercise per Week: 3 days    Minutes of Exercise per Session: 60 min  Stress: No Stress Concern Present (06/09/2023)   [employer] of Occupational Health - Occupational Stress Questionnaire    Feeling of Stress : Only a little  Social Connections: Moderately Integrated (06/09/2023)   Social Connection and Isolation Panel [NHANES]    Frequency of Communication with Friends and Family: Three times a week    Frequency of Social Gatherings with Friends and Family: Once a week    Attends Religious Services: 1 to 4 times per year    Active Member of [employer] or Organizations: Yes    Attends Engineer, structural: More than 4 times per year    Marital Status: Never married  Intimate Partner Violence: Not At Risk (05/12/2022)   Humiliation, Afraid, Rape, and Kick questionnaire    Fear of Current or Ex-Partner: No    Emotionally Abused: No    Physically Abused: No    Sexually Abused: No    Outpatient Medications Prior to Visit  Medication Sig Dispense Refill   acetaminophen (TYLENOL) 500 MG tablet Take 1,000 mg by mouth every 6 (six) hours as needed for mild pain or headache.     b complex vitamins capsule Take 1 capsule by mouth.     cloNIDine HCl (KAPVAY) 0.1 MG TB12 ER tablet Take 1 tablet (0.1 mg total) by mouth at bedtime. 90 tablet 1   lisdexamfetamine (VYVANSE) 30 MG capsule Take 1 capsule (30 mg total) by mouth daily. 30  capsule 0   lurasidone (LATUDA) 80 MG TABS tablet Take 1 tablet (80 mg total) by mouth at bedtime. With food. 30 tablet 2   Norethindrone Acetate-Ethinyl Estrad-FE (BLISOVI 24 FE) 1-20 MG-MCG(24) tablet Take 1 tablet by mouth daily. 28 tablet 12   olmesartan (BENICAR) 40 MG tablet TAKE 1 TABLET(40 MG) BY MOUTH DAILY 30 tablet 2   prenatal vitamin w/FE, FA (PRENATAL 1 + 1) 27-1 MG TABS tablet Take 1 tablet by mouth daily at 12 noon. 30 tablet 12   tirzepatide (ZEPBOUND) 10 MG/0.5ML Pen Inject 10 mg into the skin once a week.  6 mL 0   UNABLE TO FIND Omega 3-takes 1 tab daily     Vitamin D, Ergocalciferol, (DRISDOL) 1.25 MG (50000 UNIT) CAPS capsule Take 1 capsule (50,000 Units total) by mouth every 7 (seven) days. 10 capsule 1   No facility-administered medications prior to visit.    Allergies  Allergen Reactions   Selenium Sulfide Rash   Banana Other (See Comments)    headache   Motrin [Ibuprofen] Hives and Itching   Sulfa Antibiotics Rash    ROS Review of Systems  Constitutional:  Negative for chills and fever.  Eyes:  Negative for visual disturbance.  Respiratory:  Negative for chest tightness and shortness of breath.   Neurological:  Negative for dizziness and headaches.  Psychiatric/Behavioral:  Negative for suicidal ideas.       Objective:    Physical Exam HENT:     Head: Normocephalic.     Mouth/Throat:     Mouth: Mucous membranes are moist.  Cardiovascular:     Rate and Rhythm: Normal rate.     Heart sounds: Normal heart sounds.  Pulmonary:     Effort: Pulmonary effort is normal.     Breath sounds: Normal breath sounds.  Neurological:     Mental Status: She is alert.  Psychiatric:        Mood and Affect: Affect is flat.     BP 126/81   Pulse 81   Resp 16   Ht 5\' 5"  (1.651 m)   Wt (!) 317 lb 12.8 oz (144.2 kg)   LMP 05/13/2023   SpO2 96%   BMI 52.88 kg/m  Wt Readings from Last 3 Encounters:  06/11/23 (!) 317 lb 12.8 oz (144.2 kg)  06/08/23 (!) 316 lb  (143.3 kg)  05/26/23 (!) 320 lb (145.2 kg)    Lab Results  Component Value Date   TSH 2.070 09/23/2022   Lab Results  Component Value Date   WBC 8.2 06/08/2023   HGB 10.8 (L) 06/08/2023   HCT 36.3 06/08/2023   MCV 67.1 (L) 06/08/2023   PLT 403 (H) 06/08/2023   Lab Results  Component Value Date   NA 140 09/23/2022   K 4.7 09/23/2022   CO2 22 09/23/2022   GLUCOSE 93 09/23/2022   BUN 10 09/23/2022   CREATININE 0.59 09/23/2022   BILITOT 0.2 09/23/2022   ALKPHOS 142 (H) 09/23/2022   AST 16 09/23/2022   ALT 19 09/23/2022   PROT 7.4 09/23/2022   ALBUMIN 4.3 09/23/2022   CALCIUM 9.7 09/23/2022   ANIONGAP 8 09/23/2021   EGFR 130 09/23/2022   Lab Results  Component Value Date   CHOL 218 (H) 09/23/2022   Lab Results  Component Value Date   HDL 70 09/23/2022   Lab Results  Component Value Date   LDLCALC 134 (H) 09/23/2022   Lab Results  Component Value Date   TRIG 78 09/23/2022   Lab Results  Component Value Date   CHOLHDL 3.1 09/23/2022   Lab Results  Component Value Date   HGBA1C 5.8 10/28/2022      Assessment & Plan:  Long term current use of antipsychotic medication Assessment & Plan: -Pending EKG, lipid panel, hemoglobin A1c, and urine drug screening -Advised to seek  immediate assistance at [facility] in [place] or going to the Emergency Department if having suicidal thoughts and ideation. If you ever feel like you may hurt yourself or others, or have thoughts about taking your own life, get help right away. To get  help: Go to your nearest emergency department. Call your local emergency services (911 in the [place] Call the [facility] health and human services helpline (211 in the [place] Call or text a suicide hotline to speak with a trained counselor. The following suicide hotlines are available in the [place]: 1-800-273-TALK (1-989-230-0121 or 988 in the [place] 1-800-SUICIDE 959-460-4435). Text 636 274 5667. This is the  [employer] in the [place] 3855741048. This is a hotline for Spanish speakers. 832-727-6878. This is a hotline for TTY users. 1-866-4-U-TREVOR (920) 691-8465). This is a hotline for lesbian, gay, bisexual, transgender, or questioning youth. For a list of hotlines in [place], visit [employer]is.html Contact a crisis center or a local suicide prevention center. To find a crisis center or suicide prevention center: Call your local hospital, clinic, community service organization, mental health center, social service provider, or health department. Ask for help with connecting to a crisis center. For a list of crisis centers in the [place], visit: suicidepreventionlifeline.org For a list of crisis centers in [place], visit: suicideprevention.ca Get help right away if: You are talking about suicide or wishing to die. You start making plans for how to commit suicide. You feel that you have no reason to live. You start making plans for putting your affairs in order, saying goodbye, or giving your possessions away. You feel guilt, shame, or unbearable pain, and it seems like there is no way out. You are engaging in risky behaviors that could lead to death. If you have any of these thoughts or symptoms, get help right away: Go to your nearest emergency department or crisis center. Call emergency services (911 in the [place] Call or text a suicide crisis helpline.   Orders: -     Lipid panel -     Hemoglobin A1c -     EKG 12-Lead -     ToxASSURE Select 13 (MW), Urine   Note: This chart has been completed using Engineer, civil (consulting) software, and while attempts have been made to ensure accuracy, certain words and phrases may not be transcribed as intended.   Follow-up: No follow-ups on file.   Gilmore Laroche, FNP

## 2023-06-11 NOTE — Assessment & Plan Note (Signed)
-  Pending EKG, lipid panel, hemoglobin A1c, and urine drug screening -Advised to seek  immediate assistance at [facility] in [place] or going to the Emergency Department if having suicidal thoughts and ideation. If you ever feel like you may hurt yourself or others, or have thoughts about taking your own life, get help right away. To get help: Go to your nearest emergency department. Call your local emergency services (911 in the [place] Call the [facility] health and human services helpline (211 in the [place] Call or text a suicide hotline to speak with a trained counselor. The following suicide hotlines are available in the [place]: 1-800-273-TALK (1-830-653-2369 or 988 in the [place] 1-800-SUICIDE 612-286-0792). Text 260-291-7778. This is the [employer] in the [place] (317) 833-3714. This is a hotline for Spanish speakers. 475-391-2865. This is a hotline for TTY users. 1-866-4-U-TREVOR 702-428-2004). This is a hotline for lesbian, gay, bisexual, transgender, or questioning youth. For a list of hotlines in [place], visit [employer]is.html Contact a crisis center or a local suicide prevention center. To find a crisis center or suicide prevention center: Call your local hospital, clinic, community service organization, mental health center, social service provider, or health department. Ask for help with connecting to a crisis center. For a list of crisis centers in the [place], visit: suicidepreventionlifeline.org For a list of crisis centers in [place], visit: suicideprevention.ca Get help right away if: You are talking about suicide or wishing to die. You start making plans for how to commit suicide. You feel that you have no reason to live. You start making plans for putting your affairs in order, saying goodbye, or giving your possessions away. You feel guilt, shame, or unbearable pain, and it seems like  there is no way out. You are engaging in risky behaviors that could lead to death. If you have any of these thoughts or symptoms, get help right away: Go to your nearest emergency department or crisis center. Call emergency services (911 in the [place] Call or text a suicide crisis helpline.

## 2023-06-11 NOTE — Patient Instructions (Addendum)
I appreciate the opportunity to provide care to you today!    Follow up:  with PCP  Fasting Labs: please stop by the lab today/during the week to get your blood drawn ( Lipid profile, HgA1c)  If you experience suicidal thoughts or ideation, I recommend seeking immediate assistance at [facility] in [place] or going to the Emergency Department.  If you ever feel like you may hurt yourself or others, or have thoughts about taking your own life, get help right away. To get help: Go to your nearest emergency department. Call your local emergency services (911 in the [place] Call the [facility] health and human services helpline (211 in the [place] Call or text a suicide hotline to speak with a trained counselor. The following suicide hotlines are available in the [place]: 1-800-273-TALK (1-984-727-9759 or 988 in the [place] 1-800-SUICIDE (650)100-0076). Text 234-071-2223. This is the [employer] in the [place] 949-025-7614. This is a hotline for Spanish speakers. (785) 779-1218. This is a hotline for TTY users. 1-866-4-U-TREVOR 432-418-9933). This is a hotline for lesbian, gay, bisexual, transgender, or questioning youth. For a list of hotlines in [place], visit [employer]is.html Contact a crisis center or a local suicide prevention center. To find a crisis center or suicide prevention center: Call your local hospital, clinic, community service organization, mental health center, social service provider, or health department. Ask for help with connecting to a crisis center. For a list of crisis centers in the [place], visit: suicidepreventionlifeline.org For a list of crisis centers in [place], visit: suicideprevention.ca Get help right away if: You are talking about suicide or wishing to die. You start making plans for how to commit suicide. You feel that you have no reason to live. You start making plans for  putting your affairs in order, saying goodbye, or giving your possessions away. You feel guilt, shame, or unbearable pain, and it seems like there is no way out. You are engaging in risky behaviors that could lead to death. If you have any of these thoughts or symptoms, get help right away: Go to your nearest emergency department or crisis center. Call emergency services (911 in the [place] Call or text a suicide crisis helpline.     Attached with your AVS, you will find valuable resources for self-education. I highly recommend dedicating some time to thoroughly examine them.   Please continue to a heart-healthy diet and increase your physical activities. Try to exercise for at least five days a week.    It was a pleasure to see you and I look forward to continuing to work together on your health and well-being. Please do not hesitate to call the office if you need care or have questions about your care.  In case of emergency, please visit the Emergency Department for urgent care, or contact our clinic at [phone] to schedule an appointment. We're here to help you!   Have a wonderful day and week. With Gratitude, Gilmore Laroche MSN, FNP-BC

## 2023-06-12 LAB — LIPID PANEL
Chol/HDL Ratio: 3.8 ratio (ref 0.0–4.4)
Cholesterol, Total: 220 mg/dL — ABNORMAL HIGH (ref 100–199)
HDL: 58 mg/dL (ref >39)
LDL Chol Calc (NIH): 145 mg/dL — ABNORMAL HIGH (ref 0–99)
Triglycerides: 95 mg/dL (ref 0–149)
VLDL Cholesterol Cal: 17 mg/dL (ref 5–40)

## 2023-06-12 LAB — HEMOGLOBIN A1C
Est. average glucose Bld gHb Est-mCnc: 123 mg/dL
Hgb A1c MFr Bld: 5.9 % — ABNORMAL HIGH (ref 4.8–5.6)

## 2023-06-12 LAB — COPPER, SERUM: Copper: 316 ug/dL — ABNORMAL HIGH (ref 80–158)

## 2023-06-15 ENCOUNTER — Ambulatory Visit: Payer: MEDICAID | Admitting: Family Medicine

## 2023-06-16 ENCOUNTER — Encounter (HOSPITAL_COMMUNITY): Payer: Self-pay | Admitting: Psychiatry

## 2023-06-16 ENCOUNTER — Telehealth (HOSPITAL_COMMUNITY): Payer: MEDICAID | Admitting: Psychiatry

## 2023-06-16 DIAGNOSIS — Z5181 Encounter for therapeutic drug level monitoring: Secondary | ICD-10-CM

## 2023-06-16 DIAGNOSIS — Z79899 Other long term (current) drug therapy: Secondary | ICD-10-CM | POA: Diagnosis not present

## 2023-06-16 DIAGNOSIS — F4310 Post-traumatic stress disorder, unspecified: Secondary | ICD-10-CM

## 2023-06-16 DIAGNOSIS — F900 Attention-deficit hyperactivity disorder, predominantly inattentive type: Secondary | ICD-10-CM | POA: Diagnosis not present

## 2023-06-16 DIAGNOSIS — F250 Schizoaffective disorder, bipolar type: Secondary | ICD-10-CM | POA: Diagnosis not present
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
## 2023-06-16 NOTE — Patient Instructions (Signed)
We didn't make any medication changes today. Continue to watch carefully for any hallucinations, paranoia, or aggressive behavior. Keep doing your best to maintain adequate nutrition.

## 2023-06-17 LAB — TOXASSURE SELECT 13 (MW), URINE

## 2023-06-18 ENCOUNTER — Encounter: Payer: Self-pay | Admitting: Internal Medicine

## 2023-06-19 ENCOUNTER — Other Ambulatory Visit: Payer: Self-pay | Admitting: Medical Genetics

## 2023-06-19 DIAGNOSIS — Z006 Encounter for examination for normal comparison and control in clinical research program: Secondary | ICD-10-CM

## 2023-06-19 LAB — ALPHA-THALASSEMIA GENOTYPR

## 2023-06-22 ENCOUNTER — Other Ambulatory Visit (HOSPITAL_COMMUNITY): Payer: MEDICAID

## 2023-06-23 ENCOUNTER — Other Ambulatory Visit (HOSPITAL_COMMUNITY)
Admission: RE | Admit: 2023-06-23 | Discharge: 2023-06-23 | Disposition: A | Discharge status: Home / Self Care | Payer: MEDICAID | Source: Ambulatory Visit | Attending: Medical Genetics | Admitting: Medical Genetics

## 2023-06-23 ENCOUNTER — Other Ambulatory Visit (HOSPITAL_COMMUNITY): Payer: MEDICAID

## 2023-06-23 DIAGNOSIS — Z006 Encounter for examination for normal comparison and control in clinical research program: Secondary | ICD-10-CM | POA: Insufficient documentation

## 2023-06-23 NOTE — Progress Notes (Signed)
Hello Dr. Adrian Blackwater I obtained Eathel's urine drug screening along with her other labs for your review.

## 2023-06-29 ENCOUNTER — Encounter (HOSPITAL_COMMUNITY): Payer: Self-pay | Admitting: Psychiatry

## 2023-06-29 ENCOUNTER — Telehealth (INDEPENDENT_AMBULATORY_CARE_PROVIDER_SITE_OTHER): Payer: MEDICAID | Admitting: Psychiatry

## 2023-06-29 DIAGNOSIS — F250 Schizoaffective disorder, bipolar type: Secondary | ICD-10-CM

## 2023-06-29 DIAGNOSIS — Z6841 Body Mass Index (BMI) 40.0 and over, adult: Secondary | ICD-10-CM

## 2023-06-29 DIAGNOSIS — F900 Attention-deficit hyperactivity disorder, predominantly inattentive type: Secondary | ICD-10-CM | POA: Diagnosis not present

## 2023-06-29 DIAGNOSIS — Z79899 Other long term (current) drug therapy: Secondary | ICD-10-CM

## 2023-06-29 DIAGNOSIS — F4310 Post-traumatic stress disorder, unspecified: Secondary | ICD-10-CM

## 2023-06-29 DIAGNOSIS — Z5181 Encounter for therapeutic drug level monitoring: Secondary | ICD-10-CM

## 2023-06-29 MED ORDER — LURASIDONE HCL 80 MG PO TABS: 80.0000 mg | ORAL_TABLET | Freq: Every evening | ORAL | 2 refills | Status: DC

## 2023-06-29 MED ORDER — LISDEXAMFETAMINE DIMESYLATE 40 MG PO CAPS: 40.0000 mg | ORAL_CAPSULE | Freq: Every day | ORAL | 0 refills | Status: DC

## 2023-06-29 MED ORDER — LURASIDONE HCL 80 MG PO TABS: 80.0000 mg | ORAL_TABLET | Freq: Every evening | ORAL | 0 refills | Status: DC

## 2023-06-29 NOTE — Patient Instructions (Signed)
We increased the vyvanse to 40mg  once daily. Continue to closely monitor for hallucinations, suicidal/homicidal ideation, and mania. We otherwise kept your latuda, clonidine the same.

## 2023-06-29 NOTE — Progress Notes (Unsigned)
[facility] [address], [place] [address]   CLINIC:  Medical Oncology/Hematology  PCP:  Kathy Lade, MD [address] [place] [address] [phone]   REASON FOR VISIT:  Follow-up for microcytic anemia  PRIOR THERAPY: None  CURRENT THERAPY: Under workup  INTERVAL HISTORY:   Kathy Rios 22 y.o. female returns for routine follow-up of her microcytic anemia.  She was seen for initial consultation with Dr. Ellin Saba on 06/08/2023.  She returns today to discuss results of that workup.  She denies any changes in her symptoms or health status since her visit 3 weeks ago. *** Menorrhagia, on OCP since age 53 with improvement menstrual bleeding *** Rectal bleeding or melena *** Fatigue, pica *** Iron supplement? She has ***% energy and ***% appetite. She endorses that she is maintaining a stable weight.  ASSESSMENT & PLAN:  1.  Microcytic anemia - Chronic microcytic anemia with baseline Hgb 10.0-11.0 and MCV 60-70 - Denies previous history of blood transfusion. - Patient was told to have alpha thalassemia trait when she was a child at [facility], but records not available - She has a history of menorrhagia, on OCP since age 33, with improvement in menstrual bleeding. - Labs from 06/08/2023: Hgb at baseline 10.8 with MCV 67.1.  Mild thrombocytosis with platelets 403. Hemoglobin fractionation cascade unremarkable. 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  Normal B12 391, normal MMA.  Normal folate and copper. Normal LDH and reticulocytes. 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  She may also have some mild functional iron deficiency and reactive thrombocytosis. - PLAN: *** Recommend starting ferrous sulfate 325 mg every other day.  Repeat labs (CBC/D, ferritin, iron/TIBC) with phone  visit in 6 months.  If no new abnormalities at follow-up, will consider discharge from clinic at that time.  *** - We discussed diagnosis of alpha thalassemia minor as major cause of her mild anemia.  We discussed that there could be reproductive implications.  ***  2.  Social/family history: - She is a non-smoker. - Mother is alpha thalassemia carrier.  Maternal grandfather had prostate cancer.  Paternal grandmother had cervical cancer.  PLAN SUMMARY: >> *** >> *** >> ***     REVIEW OF SYSTEMS: ***  Review of Systems - Oncology   PHYSICAL EXAM:  ECOG PERFORMANCE STATUS: {CHL ONC ECOG GN:[id number]} *** There were no vitals filed for this visit. There were no vitals filed for this visit. Physical Exam  PAST MEDICAL/SURGICAL HISTORY:  Past Medical History:  Diagnosis Date   ADD (attention deficit disorder)    ADHD (attention deficit hyperactivity disorder)    Anemia    Anxiety    Chronic constipation 03/08/2018   Depression    Diabetes mellitus    Diabetes mellitus, type II (HCC)    Encounter for menstrual regulation 01/25/2016   Generalized anxiety disorder 07/03/2022   Hypertension    Insomnia 06/27/2022   Irregular periods 10/25/2015   Major depressive disorder 03/22/2018   Obesity    Patient desires pregnancy 08/25/2022   PMS (premenstrual syndrome) 11/05/2015   Possible pregnancy 08/19/2022   PTSD (post-traumatic stress disorder)    Reactive hypoglycemia    Schizoaffective disorder, depressive type (HCC)    Screening examination for STD (sexually transmitted disease) 12/18/2021   Severe menstrual cramps 11/05/2015   Thalassemia trait    Thalassemia  trait, alpha    Past Surgical History:  Procedure Laterality Date   ADENOIDECTOMY     TONSILLECTOMY     WISDOM TOOTH EXTRACTION  07/2016    SOCIAL HISTORY:  Social History   Socioeconomic History   Marital status: Single    Spouse name: Not on file   Number of children: Not on file   Years of education:  Not on file   Highest education level: Some college, no degree  Occupational History   Not on file  Tobacco Use   Smoking status: Never   Smokeless tobacco: Never  Vaping Use   Vaping status: Never Used  Substance and Sexual Activity   Alcohol use: Not Currently    Comment: once per month will have 1 alcohol unit   Drug use: Not Currently    Types: Marijuana    Comment: previously every 3-6 months but hasn't smoked in a few years   Sexual activity: Not Currently    Birth control/protection: Pill, Abstinence  Other Topics Concern   Not on file  Social History Narrative   Not on file   Social Determinants of Health   Financial Resource Strain: Medium Risk (06/09/2023)   Overall Financial Resource Strain (CARDIA)    Difficulty of Paying Living Expenses: Somewhat hard  Food Insecurity: No Food Insecurity (06/09/2023)   Hunger Vital Sign    Worried About Running Out of Food in the Last Year: Never true    Ran Out of Food in the Last Year: Never true  Transportation Needs: No Transportation Needs (06/09/2023)   PRAPARE - Administrator, Civil Service (Medical): No    Lack of Transportation (Non-Medical): No  Physical Activity: Sufficiently Active (06/09/2023)   Exercise Vital Sign    Days of Exercise per Week: 3 days    Minutes of Exercise per Session: 60 min  Stress: No Stress Concern Present (06/09/2023)   [employer] of Occupational Health - Occupational Stress Questionnaire    Feeling of Stress : Only a little  Social Connections: Moderately Integrated (06/09/2023)   Social Connection and Isolation Panel [NHANES]    Frequency of Communication with Friends and Family: Three times a week    Frequency of Social Gatherings with Friends and Family: Once a week    Attends Religious Services: 1 to 4 times per year    Active Member of [employer] or Organizations: Yes    Attends Engineer, structural: More than 4 times per year    Marital Status: Never  married  Intimate Partner Violence: Not At Risk (05/12/2022)   Humiliation, Afraid, Rape, and Kick questionnaire    Fear of Current or Ex-Partner: No    Emotionally Abused: No    Physically Abused: No    Sexually Abused: No    FAMILY HISTORY:  Family History  Problem Relation Age of Onset   Seizures Mother    Arthritis Mother    Diabetes Mother    Hyperlipidemia Mother    Depression Mother    Hyperlipidemia Father    Hypertension Father    Gout Father    Dementia Father    Hypertension Sister    Mental illness Brother    Hyperlipidemia Brother    ADD / ADHD Brother    Depression Brother    Hypertension Maternal Grandmother    Cancer Maternal Grandmother    Hypertension Maternal Grandfather    Thyroid disease Maternal Grandfather    Cancer Maternal Grandfather    Prostate cancer Maternal  Grandfather    Anemia Paternal Grandmother    Cancer Paternal Grandmother        cervical   Hypertension Paternal Grandfather    Heart disease Paternal Grandfather    Hypertension Other    Other Other        heart skips-maternal great grandma   Other Other        fibroids- maternal grandma and great grandma   Heart disease Other    Schizophrenia Maternal Aunt    Bipolar disorder Maternal Aunt    Alcohol abuse Maternal Uncle     CURRENT MEDICATIONS:  Outpatient Encounter Medications as of 06/30/2023  Medication Sig   acetaminophen (TYLENOL) 500 MG tablet Take 1,000 mg by mouth every 6 (six) hours as needed for mild pain or headache.   b complex vitamins capsule Take 1 capsule by mouth.   cloNIDine HCl (KAPVAY) 0.1 MG TB12 ER tablet Take 1 tablet (0.1 mg total) by mouth at bedtime.   lisdexamfetamine (VYVANSE) 40 MG capsule Take 1 capsule (40 mg total) by mouth daily.   lurasidone (LATUDA) 80 MG TABS tablet Take 1 tablet (80 mg total) by mouth at bedtime. With food.   Norethindrone Acetate-Ethinyl Estrad-FE (BLISOVI 24 FE) 1-20 MG-MCG(24) tablet Take 1 tablet by mouth daily.    olmesartan (BENICAR) 40 MG tablet TAKE 1 TABLET(40 MG) BY MOUTH DAILY   prenatal vitamin w/FE, FA (PRENATAL 1 + 1) 27-1 MG TABS tablet Take 1 tablet by mouth daily at 12 noon.   tirzepatide (ZEPBOUND) 10 MG/0.5ML Pen Inject 10 mg into the skin once a week.   UNABLE TO FIND Omega 3-takes 1 tab daily   Vitamin D, Ergocalciferol, (DRISDOL) 1.25 MG (50000 UNIT) CAPS capsule Take 1 capsule (50,000 Units total) by mouth every 7 (seven) days.   No facility-administered encounter medications on file as of 06/30/2023.    ALLERGIES:  Allergies  Allergen Reactions   Selenium Sulfide Rash   Banana Other (See Comments)    headache   Motrin [Ibuprofen] Hives and Itching   Sulfa Antibiotics Rash    LABORATORY DATA:  I have reviewed the labs as listed.  CBC    Component Value Date/Time   WBC 8.2 06/08/2023 1422   RBC 5.43 (H) 06/08/2023 1423   RBC 5.41 (H) 06/08/2023 1422   HGB 10.8 (L) 06/08/2023 1422   HGB 10.9 (L) 09/23/2022 0932   HCT 36.3 06/08/2023 1422   HCT 35.4 09/23/2022 0932   PLT 403 (H) 06/08/2023 1422   PLT 424 09/23/2022 0932   MCV 67.1 (L) 06/08/2023 1422   MCV 67 (L) 09/23/2022 0932   MCH 20.0 (L) 06/08/2023 1422   MCHC 29.8 (L) 06/08/2023 1422   RDW 18.8 (H) 06/08/2023 1422   RDW 15.0 09/23/2022 0932   LYMPHSABS 2.4 06/08/2023 1422   LYMPHSABS 2.3 09/23/2022 0932   MONOABS 0.7 06/08/2023 1422   EOSABS 0.1 06/08/2023 1422   EOSABS 0.1 09/23/2022 0932   BASOSABS 0.1 06/08/2023 1422   BASOSABS 0.0 09/23/2022 0932      Latest Ref Rng & Units 09/23/2022    9:32 AM 06/30/2022   10:39 AM 09/23/2021    6:24 PM  CMP  Glucose 70 - 99 mg/dL 93  98  366   BUN 6 - 20 mg/dL 10  11  11    Creatinine 0.57 - 1.00 mg/dL 4.40  3.47  4.25   Sodium 134 - 144 mmol/L 140  136  135   Potassium 3.5 - 5.2 mmol/L  4.7  5.0  4.0   Chloride 96 - 106 mmol/L 104  104  104   CO2 20 - 29 mmol/L 22  21  23    Calcium 8.7 - 10.2 mg/dL 9.7  9.1  9.3   Total Protein 6.0 - 8.5 g/dL 7.4  7.5  8.1    Total Bilirubin 0.0 - 1.2 mg/dL 0.2  <1.6  0.1   Alkaline Phos 44 - 121 IU/L 142  117  75   AST 0 - 40 IU/L 16  12  17    ALT 0 - 32 IU/L 19  18  24      DIAGNOSTIC IMAGING:  I have independently reviewed the relevant imaging and discussed with the patient.   WRAP UP:  All questions were answered. The patient knows to call the clinic with any problems, questions or concerns.  Medical decision making: ***  Time spent on visit: I spent *** minutes counseling the patient face to face. The total time spent in the appointment was *** minutes and more than 50% was on counseling.  Carnella Guadalajara, PA-C  ***
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
## 2023-06-30 ENCOUNTER — Encounter: Payer: Self-pay | Admitting: Physician Assistant

## 2023-06-30 ENCOUNTER — Inpatient Hospital Stay: Payer: MEDICAID | Attending: Physician Assistant | Admitting: Physician Assistant

## 2023-06-30 VITALS — BP 132/82 | HR 98 | Temp 98.1°F | Resp 18 | Ht 65.0 in | Wt 315.0 lb

## 2023-06-30 DIAGNOSIS — D509 Iron deficiency anemia, unspecified: Secondary | ICD-10-CM | POA: Insufficient documentation

## 2023-06-30 DIAGNOSIS — Z809 Family history of malignant neoplasm, unspecified: Secondary | ICD-10-CM | POA: Insufficient documentation

## 2023-06-30 DIAGNOSIS — Z79899 Other long term (current) drug therapy: Secondary | ICD-10-CM | POA: Insufficient documentation

## 2023-06-30 DIAGNOSIS — D563 Thalassemia minor: Secondary | ICD-10-CM | POA: Insufficient documentation

## 2023-06-30 DIAGNOSIS — E611 Iron deficiency: Secondary | ICD-10-CM | POA: Diagnosis not present

## 2023-06-30 DIAGNOSIS — Z808 Family history of malignant neoplasm of other organs or systems: Secondary | ICD-10-CM | POA: Diagnosis not present

## 2023-06-30 DIAGNOSIS — Z8042 Family history of malignant neoplasm of prostate: Secondary | ICD-10-CM | POA: Insufficient documentation

## 2023-06-30 NOTE — Patient Instructions (Signed)
[facility] at [facility] **VISIT SUMMARY & IMPORTANT INSTRUCTIONS **   You were seen today by Rojelio Brenner PA-C for your anemia.    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 This causes your mild anemia and small red blood cells. In your case, it is not causing any harm, but these low blood levels are simply "your normal." Your hemoglobin tends to run between 10.0 -11.0.  As long as you are within this range, I do not see any cause for concern. As we discussed, this may have reproductive implications, and if you are considering having children in the future, you and your partner may want to discuss this with a Dentist.  IRON DEFICIENCY You have very mild iron deficiency, which is likely related to monthly blood loss from your menstrual periods. I would like you to start taking over-the-counter iron supplement (ferrous sulfate 325 mg) every other day. This may cause mild constipation.  You can relieve this by drinking plenty of water and eating a high-fiber diet, or taking over-the-counter stool softeners. Will recheck your iron levels in 6 months to see whether or not you should continue taking iron pill.  FOLLOW-UP APPOINTMENT: Labs and office visit in 6 months.  ** Thank you for trusting me with your healthcare!  I strive to provide all of my patients with quality care at each visit.  If you receive a survey for this visit, I would be so grateful to you for taking the time to provide feedback.  Thank you in advance!  ~ Omya Winfield                   Dr. Doreatha Massed   &   Rojelio Brenner, PA-C   - - - - - - - - - - - - - - - - - -    Thank you for choosing [facility] at [facility] to provide your oncology and hematology care.  To afford each patient quality time with our provider, please arrive at least 15 minutes  before your scheduled appointment time.   If you have a lab appointment with the Cancer Center please come in thru the Main Entrance and check in at the main information desk.  You need to re-schedule your appointment should you arrive 10 or more minutes late.  We strive to give you quality time with our providers, and arriving late affects you and other patients whose appointments are after yours.  Also, if you no show three or more times for appointments you may be dismissed from the clinic at the providers discretion.     Again, thank you for choosing [facility].  Our hope is that these requests will decrease the amount of time that you wait before being seen by our physicians.       _____________________________________________________________  Should you have questions after your visit to [facility], please contact our office at [phone] and follow the prompts.  Our office hours are 8:00 a.m. and 4:30 p.m. Monday - Friday.  Please note that voicemails left after 4:00 p.m. may not be returned until the following business day.  We are closed weekends and major holidays.  You do have access to a nurse 24-7, just call the main number to the clinic [phone] and do not press any options, hold on the line  and a nurse will answer the phone.    For prescription refill requests, have your pharmacy contact our office and allow 72 hours.

## 2023-07-01 ENCOUNTER — Ambulatory Visit: Payer: MEDICAID | Admitting: Family Medicine

## 2023-07-01 NOTE — Telephone Encounter (Signed)
Please advise 

## 2023-07-03 LAB — HELIX MOLECULAR SCREEN: Genetic Analysis Overall Interpretation: NEGATIVE

## 2023-07-03 LAB — GENECONNECT MOLECULAR SCREEN

## 2023-07-05 ENCOUNTER — Other Ambulatory Visit: Payer: Self-pay | Admitting: Internal Medicine

## 2023-07-05 DIAGNOSIS — I10 Essential (primary) hypertension: Secondary | ICD-10-CM

## 2023-07-06 ENCOUNTER — Encounter: Payer: Self-pay | Admitting: Internal Medicine

## 2023-07-06 ENCOUNTER — Ambulatory Visit (INDEPENDENT_AMBULATORY_CARE_PROVIDER_SITE_OTHER): Payer: MEDICAID | Admitting: Internal Medicine

## 2023-07-06 VITALS — BP 138/87 | HR 103 | Temp 98.5°F | Resp 16 | Ht 65.0 in | Wt 311.6 lb

## 2023-07-06 DIAGNOSIS — Z23 Encounter for immunization: Secondary | ICD-10-CM

## 2023-07-06 DIAGNOSIS — E559 Vitamin D deficiency, unspecified: Secondary | ICD-10-CM | POA: Insufficient documentation

## 2023-07-06 DIAGNOSIS — I10 Essential (primary) hypertension: Secondary | ICD-10-CM | POA: Diagnosis not present

## 2023-07-06 NOTE — Progress Notes (Signed)
Established Patient Office Visit  Subjective   Patient ID: Kathy Rios, female    DOB: [birth year]  Age: 22 y.o. MRN: [id number]  Chief Complaint  Patient presents with   Sore Throat    Started Nov 14 with a sore scratchy throat and then about 2 days ago her sinuses started draining. No fever. Has been taking theraflu otc.    [name] returns to care today for routine follow-up.  She was last evaluated by me on 8/29.  Zepbound was increased to 7.5 mg weekly at that time and a referral was placed to hematology in the setting of alpha thalassemia trait.  In the interim, she has been seen by psychiatry for follow-up and has establish care with hematology.  She presented to [facility] on 10/24 for follow-up monitoring in the setting of long-term antipsychotic use.  She also requested to stop Zepbound while concomitantly taking a stimulant (Vyvanse).  There have otherwise been no acute interval events.  [name] reports feeling fairly well today.  She endorses resolving upper respiratory symptoms.  Her mental health has improved with recent medication adjustments.  She does not have any acute concerns to discuss today.  Past Medical History:  Diagnosis Date   ADD (attention deficit disorder)    ADHD (attention deficit hyperactivity disorder)    Anemia    Anxiety    Chronic constipation 03/08/2018   Depression    Diabetes mellitus    Diabetes mellitus, type II (HCC)    Encounter for menstrual regulation 01/25/2016   Generalized anxiety disorder 07/03/2022   Hypertension    Insomnia 06/27/2022   Irregular periods 10/25/2015   Major depressive disorder 03/22/2018   Obesity    Patient desires pregnancy 08/25/2022   PMS (premenstrual syndrome) 11/05/2015   Possible pregnancy 08/19/2022   PTSD (post-traumatic stress disorder)    Reactive hypoglycemia    Schizoaffective disorder, depressive type (HCC)    Screening examination for STD (sexually transmitted disease) 12/18/2021   Severe  menstrual cramps 11/05/2015   Thalassemia trait    Thalassemia trait, alpha    Past Surgical History:  Procedure Laterality Date   ADENOIDECTOMY     TONSILLECTOMY     WISDOM TOOTH EXTRACTION  07/2016   Social History   Tobacco Use   Smoking status: Never   Smokeless tobacco: Never  Vaping Use   Vaping status: Never Used  Substance Use Topics   Alcohol use: Not Currently    Comment: once per month will have 1 alcohol unit   Drug use: Not Currently    Types: Marijuana    Comment: previously every 3-6 months but hasn't smoked in a few years   Family History  Problem Relation Age of Onset   Seizures Mother    Arthritis Mother    Diabetes Mother    Hyperlipidemia Mother    Depression Mother    Hyperlipidemia Father    Hypertension Father    Gout Father    Dementia Father    Hypertension Sister    Mental illness Brother    Hyperlipidemia Brother    ADD / ADHD Brother    Depression Brother    Hypertension Maternal Grandmother    Cancer Maternal Grandmother    Hypertension Maternal Grandfather    Thyroid disease Maternal Grandfather    Cancer Maternal Grandfather    Prostate cancer Maternal Grandfather    Anemia Paternal Grandmother    Cancer Paternal Grandmother        cervical   Hypertension  Paternal Grandfather    Heart disease Paternal Grandfather    Hypertension Other    Other Other        heart skips-maternal great grandma   Other Other        fibroids- maternal grandma and great grandma   Heart disease Other    Schizophrenia Maternal Aunt    Bipolar disorder Maternal Aunt    Alcohol abuse Maternal Uncle    Allergies  Allergen Reactions   Selenium Sulfide Rash   Banana Other (See Comments)    headache   Motrin [Ibuprofen] Hives and Itching   Sulfa Antibiotics Rash   Review of Systems  Constitutional:  Negative for chills and fever.  HENT:  Positive for congestion and sore throat.   All other systems reviewed and are negative.    Objective:      BP 138/87   Pulse (!) 103   Temp 98.5 F (36.9 C) (Oral)   Resp 16   Ht 5\' 5"  (1.651 m)   Wt (!) 311 lb 9.6 oz (141.3 kg)   SpO2 98%   BMI 51.85 kg/m  BP Readings from Last 3 Encounters:  07/06/23 138/87  06/30/23 132/82  06/11/23 126/81   Physical Exam Vitals reviewed.  Constitutional:      General: She is not in acute distress.    Appearance: Normal appearance. She is obese. She is not toxic-appearing.  HENT:     Head: Normocephalic and atraumatic.     Right Ear: External ear normal.     Left Ear: External ear normal.     Nose: Congestion and rhinorrhea present.     Mouth/Throat:     Mouth: Mucous membranes are moist.     Pharynx: Oropharynx is clear. No oropharyngeal exudate or posterior oropharyngeal erythema.  Eyes:     General: No scleral icterus.    Extraocular Movements: Extraocular movements intact.     Conjunctiva/sclera: Conjunctivae normal.     Pupils: Pupils are equal, round, and reactive to light.  Cardiovascular:     Rate and Rhythm: Normal rate and regular rhythm.     Pulses: Normal pulses.     Heart sounds: Normal heart sounds. No murmur heard.    No friction rub. No gallop.  Pulmonary:     Effort: Pulmonary effort is normal.     Breath sounds: Normal breath sounds. No wheezing, rhonchi or rales.  Abdominal:     General: Abdomen is flat. Bowel sounds are normal. There is no distension.     Palpations: Abdomen is soft.     Tenderness: There is no abdominal tenderness.  Musculoskeletal:        General: No swelling. Normal range of motion.     Cervical back: Normal range of motion.     Right lower leg: No edema.     Left lower leg: No edema.  Lymphadenopathy:     Cervical: No cervical adenopathy.  Skin:    General: Skin is warm and dry.     Capillary Refill: Capillary refill takes less than 2 seconds.     Coloration: Skin is not jaundiced.  Neurological:     General: No focal deficit present.     Mental Status: She is alert and oriented to  person, place, and time.  Psychiatric:        Mood and Affect: Mood normal.        Behavior: Behavior normal.   Last CBC Lab Results  Component Value Date   WBC 8.2 06/08/2023  HGB 10.8 (L) 06/08/2023   HCT 36.3 06/08/2023   MCV 67.1 (L) 06/08/2023   MCH 20.0 (L) 06/08/2023   RDW 18.8 (H) 06/08/2023   PLT 403 (H) 06/08/2023   Last metabolic panel Lab Results  Component Value Date   GLUCOSE 93 09/23/2022   NA 140 09/23/2022   K 4.7 09/23/2022   CL 104 09/23/2022   CO2 22 09/23/2022   BUN 10 09/23/2022   CREATININE 0.59 09/23/2022   EGFR 130 09/23/2022   CALCIUM 9.7 09/23/2022   PROT 7.4 09/23/2022   ALBUMIN 4.3 09/23/2022   LABGLOB 3.1 09/23/2022   AGRATIO 1.4 09/23/2022   BILITOT 0.2 09/23/2022   ALKPHOS 142 (H) 09/23/2022   AST 16 09/23/2022   ALT 19 09/23/2022   ANIONGAP 8 09/23/2021   Last lipids Lab Results  Component Value Date   CHOL 220 (H) 06/11/2023   HDL 58 06/11/2023   LDLCALC 145 (H) 06/11/2023   TRIG 95 06/11/2023   CHOLHDL 3.8 06/11/2023   Last hemoglobin A1c Lab Results  Component Value Date   HGBA1C 5.9 (H) 06/11/2023   Last thyroid functions Lab Results  Component Value Date   TSH 2.070 09/23/2022   Last vitamin D Lab Results  Component Value Date   VD25OH 20.0 (L) 09/23/2022   Last vitamin B12 and Folate Lab Results  Component Value Date   VITAMINB12 391 06/08/2023   FOLATE 19.9 06/08/2023     Assessment & Plan:   Problem List Items Addressed This Visit       Essential hypertension    Remains adequately controlled on current antihypertensive regimen.  No medication changes are indicated today.      Morbid obesity (HCC)    Her weight today is 311 pounds, down an additional 6 pounds since her last appointment.  She recently requested to stop Zepbound after starting a stimulant (Vyvanse) due to significant appetite depression.  She has made significant dietary changes and will focus on maintaining lifestyle modifications  aimed at weight loss for now.  Zepbound has been discontinued.      Need for immunization against influenza    Influenza vaccine administered today      Vitamin D deficiency - Primary    Noted on previous labs.  She has completed high-dose, weekly vitamin D supplementation. -Repeat vitamin D level ordered today      Return in about 6 months (around 01/03/2024).   Billie Lade, MD

## 2023-07-06 NOTE — Assessment & Plan Note (Addendum)
Noted on previous labs.  She has completed high-dose, weekly vitamin D supplementation. -Repeat vitamin D level ordered today 

## 2023-07-06 NOTE — Assessment & Plan Note (Signed)
Her weight today is 311 pounds, down an additional 6 pounds since her last appointment.  She recently requested to stop Zepbound after starting a stimulant (Vyvanse) due to significant appetite depression.  She has made significant dietary changes and will focus on maintaining lifestyle modifications aimed at weight loss for now.  Zepbound has been discontinued.

## 2023-07-06 NOTE — Assessment & Plan Note (Signed)
 Remains adequately controlled on current antihypertensive regimen.  No medication changes are indicated today.

## 2023-07-06 NOTE — Patient Instructions (Signed)
It was a pleasure to see you today.  Thank you for giving [place] the opportunity to be involved in your care.  Below is a brief recap of your visit and next steps.  We will plan to see you again in 6 months.  Summary No medication changes today Repeat vitamin D level ordered Follow up in 6 months

## 2023-07-06 NOTE — Assessment & Plan Note (Signed)
Influenza vaccine administered today.

## 2023-07-07 LAB — VITAMIN D 25 HYDROXY (VIT D DEFICIENCY, FRACTURES): Vit D, 25-Hydroxy: 37.6 ng/mL (ref 30.0–100.0)

## 2023-07-14 ENCOUNTER — Encounter (HOSPITAL_COMMUNITY): Payer: Self-pay | Admitting: Psychiatry

## 2023-07-14 ENCOUNTER — Telehealth (HOSPITAL_COMMUNITY): Payer: MEDICAID | Admitting: Psychiatry

## 2023-07-14 DIAGNOSIS — Z5181 Encounter for therapeutic drug level monitoring: Secondary | ICD-10-CM

## 2023-07-14 DIAGNOSIS — N943 Premenstrual tension syndrome: Secondary | ICD-10-CM | POA: Diagnosis not present

## 2023-07-14 DIAGNOSIS — Z79899 Other long term (current) drug therapy: Secondary | ICD-10-CM

## 2023-07-14 DIAGNOSIS — F250 Schizoaffective disorder, bipolar type: Secondary | ICD-10-CM

## 2023-07-14 DIAGNOSIS — F4310 Post-traumatic stress disorder, unspecified: Secondary | ICD-10-CM | POA: Diagnosis not present

## 2023-07-14 DIAGNOSIS — F900 Attention-deficit hyperactivity disorder, predominantly inattentive type: Secondary | ICD-10-CM | POA: Diagnosis not present

## 2023-07-14 MED ORDER — LURASIDONE HCL 60 MG PO TABS: 60.0000 mg | ORAL_TABLET | Freq: Every evening | ORAL | 0 refills | Status: DC

## 2023-07-14 NOTE — Patient Instructions (Signed)
We decreased the Latuda to 60 mg nightly today.  Please continue to be on close watch for any return of the psychotic symptoms which include but are not limited to hallucinations, paranoia, suicidal ideation, homicidal ideation.  If they should recur, let me know immediately and stop taking the Vyvanse and return to the 80 mg dose of Latuda.
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
## 2023-07-17 ENCOUNTER — Ambulatory Visit: Payer: MEDICAID | Admitting: Internal Medicine

## 2023-07-24 ENCOUNTER — Ambulatory Visit: Payer: MEDICAID | Admitting: Internal Medicine

## 2023-07-28 ENCOUNTER — Telehealth (HOSPITAL_COMMUNITY): Payer: 59 | Admitting: Psychiatry

## 2023-07-28 ENCOUNTER — Encounter (HOSPITAL_COMMUNITY): Payer: Self-pay | Admitting: Psychiatry

## 2023-07-28 DIAGNOSIS — Z5181 Encounter for therapeutic drug level monitoring: Secondary | ICD-10-CM | POA: Diagnosis not present

## 2023-07-28 DIAGNOSIS — Z79899 Other long term (current) drug therapy: Secondary | ICD-10-CM

## 2023-07-28 DIAGNOSIS — Z6841 Body Mass Index (BMI) 40.0 and over, adult: Secondary | ICD-10-CM | POA: Diagnosis not present

## 2023-07-28 DIAGNOSIS — F900 Attention-deficit hyperactivity disorder, predominantly inattentive type: Secondary | ICD-10-CM | POA: Diagnosis not present

## 2023-07-28 DIAGNOSIS — F250 Schizoaffective disorder, bipolar type: Secondary | ICD-10-CM

## 2023-07-28 MED ORDER — LISDEXAMFETAMINE DIMESYLATE 50 MG PO CAPS: 50.0000 mg | ORAL_CAPSULE | Freq: Every day | ORAL | 0 refills | Status: DC

## 2023-07-28 NOTE — Patient Instructions (Signed)
We increased the Vyvanse to 50 mg once daily today.  As before, if he noticed any symptoms of psychosis coming back on please contact my office immediately and stop taking the Vyvanse.  Let me know if I need to make other changes to the note for school.
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
## 2023-07-29 ENCOUNTER — Encounter (HOSPITAL_COMMUNITY): Payer: Self-pay

## 2023-08-11 ENCOUNTER — Telehealth (HOSPITAL_COMMUNITY): Payer: 59 | Admitting: Psychiatry

## 2023-08-11 ENCOUNTER — Encounter (HOSPITAL_COMMUNITY): Payer: Self-pay | Admitting: Psychiatry

## 2023-08-11 ENCOUNTER — Encounter (HOSPITAL_COMMUNITY): Payer: Self-pay

## 2023-08-11 DIAGNOSIS — F4310 Post-traumatic stress disorder, unspecified: Secondary | ICD-10-CM | POA: Diagnosis not present

## 2023-08-11 DIAGNOSIS — Z79899 Other long term (current) drug therapy: Secondary | ICD-10-CM

## 2023-08-11 DIAGNOSIS — F411 Generalized anxiety disorder: Secondary | ICD-10-CM | POA: Diagnosis not present

## 2023-08-11 DIAGNOSIS — F900 Attention-deficit hyperactivity disorder, predominantly inattentive type: Secondary | ICD-10-CM

## 2023-08-11 DIAGNOSIS — F250 Schizoaffective disorder, bipolar type: Secondary | ICD-10-CM

## 2023-08-11 DIAGNOSIS — Z5181 Encounter for therapeutic drug level monitoring: Secondary | ICD-10-CM | POA: Diagnosis not present

## 2023-08-11 MED ORDER — LURASIDONE HCL 40 MG PO TABS: 40.0000 mg | ORAL_TABLET | Freq: Every evening | ORAL | 2 refills | Status: DC

## 2023-08-11 NOTE — Patient Instructions (Signed)
We decreased the lurasidone to 40 mg nightly with food today.  Please continue to be on the lookout for any return of hallucinations, paranoia, suicidal or homicidal ideation.  If any of these were to recur please let me know soon as possible, stop the Vyvanse and return to the 60 mg dose of the lurasidone.
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
## 2023-08-20 ENCOUNTER — Other Ambulatory Visit (HOSPITAL_COMMUNITY): Payer: Self-pay | Admitting: Psychiatry

## 2023-08-20 DIAGNOSIS — F250 Schizoaffective disorder, bipolar type: Secondary | ICD-10-CM

## 2023-08-25 ENCOUNTER — Encounter (HOSPITAL_COMMUNITY): Payer: Self-pay | Admitting: Psychiatry

## 2023-08-25 ENCOUNTER — Encounter (HOSPITAL_COMMUNITY): Payer: Self-pay

## 2023-08-25 ENCOUNTER — Telehealth (INDEPENDENT_AMBULATORY_CARE_PROVIDER_SITE_OTHER): Payer: 59 | Admitting: Psychiatry

## 2023-08-25 DIAGNOSIS — F4310 Post-traumatic stress disorder, unspecified: Secondary | ICD-10-CM

## 2023-08-25 DIAGNOSIS — F900 Attention-deficit hyperactivity disorder, predominantly inattentive type: Secondary | ICD-10-CM | POA: Diagnosis not present

## 2023-08-25 DIAGNOSIS — F250 Schizoaffective disorder, bipolar type: Secondary | ICD-10-CM

## 2023-08-25 DIAGNOSIS — Z5181 Encounter for therapeutic drug level monitoring: Secondary | ICD-10-CM

## 2023-08-25 DIAGNOSIS — Z6841 Body Mass Index (BMI) 40.0 and over, adult: Secondary | ICD-10-CM

## 2023-08-25 DIAGNOSIS — Z79899 Other long term (current) drug therapy: Secondary | ICD-10-CM | POA: Diagnosis not present

## 2023-08-25 MED ORDER — LISDEXAMFETAMINE DIMESYLATE 60 MG PO CAPS: 60.0000 mg | ORAL_CAPSULE | Freq: Every day | ORAL | 0 refills | Status: DC

## 2023-08-25 NOTE — Addendum Note (Signed)
 Addended by: Tia Masker on: 08/25/2023 12:53 PM   Modules accepted: Orders
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
## 2023-08-25 NOTE — Patient Instructions (Signed)
 We increased the vyvanse to 60mg  daily today. Continue to watch out for any signs of returning psychosis including paranoia, hallucinations, suicidal/homicidal ideation. If those recur, stop the vyvanse and let our vyvanse know.

## 2023-08-26 ENCOUNTER — Ambulatory Visit: Payer: 59 | Admitting: Family Medicine

## 2023-08-26 DIAGNOSIS — R635 Abnormal weight gain: Secondary | ICD-10-CM | POA: Diagnosis not present

## 2023-09-01 DIAGNOSIS — F28 Other psychotic disorder not due to a substance or known physiological condition: Secondary | ICD-10-CM | POA: Diagnosis not present

## 2023-09-01 DIAGNOSIS — F4389 Other reactions to severe stress: Secondary | ICD-10-CM | POA: Diagnosis not present

## 2023-09-07 ENCOUNTER — Encounter: Payer: Self-pay | Admitting: Internal Medicine

## 2023-09-07 ENCOUNTER — Ambulatory Visit (INDEPENDENT_AMBULATORY_CARE_PROVIDER_SITE_OTHER): Payer: 59 | Admitting: Internal Medicine

## 2023-09-07 VITALS — BP 133/82 | HR 90 | Ht 60.0 in | Wt 312.2 lb

## 2023-09-07 DIAGNOSIS — F28 Other psychotic disorder not due to a substance or known physiological condition: Secondary | ICD-10-CM | POA: Diagnosis not present

## 2023-09-07 DIAGNOSIS — Z6841 Body Mass Index (BMI) 40.0 and over, adult: Secondary | ICD-10-CM

## 2023-09-07 DIAGNOSIS — E66813 Obesity, class 3: Secondary | ICD-10-CM | POA: Diagnosis not present

## 2023-09-07 DIAGNOSIS — R7303 Prediabetes: Secondary | ICD-10-CM | POA: Diagnosis not present

## 2023-09-07 DIAGNOSIS — F4389 Other reactions to severe stress: Secondary | ICD-10-CM | POA: Diagnosis not present

## 2023-09-07 NOTE — Assessment & Plan Note (Signed)
Presenting today for an acute visit requesting a referral to [facility].  BMI 60.9.  She is prediabetic.  Agree that weight loss is imperative.  Medical weight management referral placed today.  She will return to care for previously scheduled follow-up in May 2025.

## 2023-09-07 NOTE — Patient Instructions (Signed)
It was a pleasure to see you today.  Thank you for giving [place] the opportunity to be involved in your care.  Below is a brief recap of your visit and next steps.  We will plan to see you again in May.  Summary Healthy weight and wellness referral placed today Follow up as scheduled in May

## 2023-09-07 NOTE — Progress Notes (Signed)
Acute Office Visit  Subjective:     Patient ID: Kathy Rios, female    DOB: 04-01-2001, 23 y.o.   MRN: [id number]  Chief Complaint  Patient presents with   Referral    Referral to healthy weight and wellness    [name] presents today for an acute visit requesting referral to medical weight management.  She was recently evaluated by bariatrics at [facility] but would like to establish care with a different provider due to cost.  She has spoken with [facility] and thinks that it would be a good program for her.  She does not have any additional concerns to discuss today.  Review of Systems  Constitutional:  Negative for chills and fever.  HENT:  Negative for sore throat.   Respiratory:  Negative for cough and shortness of breath.   Cardiovascular:  Negative for chest pain, palpitations and leg swelling.  Gastrointestinal:  Negative for abdominal pain, blood in stool, constipation, diarrhea, nausea and vomiting.  Genitourinary:  Negative for dysuria and hematuria.  Musculoskeletal:  Negative for myalgias.  Skin:  Negative for itching and rash.  Neurological:  Negative for dizziness and headaches.  Psychiatric/Behavioral:  Negative for depression and suicidal ideas.       Objective:    BP 133/82 (BP Location: Right Arm, Patient Position: Sitting, Cuff Size: Large)   Pulse 90   Ht 5' (1.524 m)   Wt (!) 312 lb 3.2 oz (141.6 kg)   SpO2 98%   BMI 60.97 kg/m   Physical Exam Vitals reviewed.  Constitutional:      General: She is not in acute distress.    Appearance: Normal appearance. She is obese. She is not toxic-appearing.  HENT:     Head: Normocephalic and atraumatic.     Right Ear: External ear normal.     Left Ear: External ear normal.     Nose: Nose normal. No congestion or rhinorrhea.     Mouth/Throat:     Mouth: Mucous membranes are moist.     Pharynx: Oropharynx is clear. No oropharyngeal exudate or posterior oropharyngeal  erythema.  Eyes:     General: No scleral icterus.    Extraocular Movements: Extraocular movements intact.     Conjunctiva/sclera: Conjunctivae normal.     Pupils: Pupils are equal, round, and reactive to light.  Cardiovascular:     Rate and Rhythm: Normal rate and regular rhythm.     Pulses: Normal pulses.     Heart sounds: Normal heart sounds. No murmur heard.    No friction rub. No gallop.  Pulmonary:     Effort: Pulmonary effort is normal.     Breath sounds: Normal breath sounds. No wheezing, rhonchi or rales.  Abdominal:     General: Abdomen is flat. Bowel sounds are normal. There is no distension.     Palpations: Abdomen is soft.     Tenderness: There is no abdominal tenderness.  Musculoskeletal:        General: No swelling. Normal range of motion.     Cervical back: Normal range of motion.     Right lower leg: No edema.     Left lower leg: No edema.  Lymphadenopathy:     Cervical: No cervical adenopathy.  Skin:    General: Skin is warm and dry.     Capillary Refill: Capillary refill takes less than 2 seconds.     Coloration: Skin is not jaundiced.  Neurological:  General: No focal deficit present.     Mental Status: She is alert and oriented to person, place, and time.  Psychiatric:        Mood and Affect: Mood normal.        Behavior: Behavior normal.       Assessment & Plan:   Problem List Items Addressed This Visit       Class 3 severe obesity due to excess calories with body mass index (BMI) of 60.0 to 69.9 in adult [facility])   Presenting today for an acute visit requesting a referral to [facility].  BMI 60.9.  She is prediabetic.  Agree that weight loss is imperative.  Medical weight management referral placed today.  She will return to care for previously scheduled follow-up in May 2025.      Return if symptoms worsen or fail to improve.  Billie Lade, MD

## 2023-09-13 ENCOUNTER — Encounter: Payer: Self-pay | Admitting: Internal Medicine

## 2023-09-14 ENCOUNTER — Encounter (HOSPITAL_COMMUNITY): Payer: Self-pay

## 2023-09-14 ENCOUNTER — Other Ambulatory Visit (HOSPITAL_COMMUNITY): Payer: Self-pay | Admitting: Psychiatry

## 2023-09-14 DIAGNOSIS — F250 Schizoaffective disorder, bipolar type: Secondary | ICD-10-CM

## 2023-09-14 DIAGNOSIS — F28 Other psychotic disorder not due to a substance or known physiological condition: Secondary | ICD-10-CM | POA: Diagnosis not present

## 2023-09-14 DIAGNOSIS — F4389 Other reactions to severe stress: Secondary | ICD-10-CM | POA: Diagnosis not present

## 2023-09-14 MED ORDER — LURASIDONE HCL 40 MG PO TABS: 40.0000 mg | ORAL_TABLET | Freq: Every evening | ORAL | 2 refills | Status: DC

## 2023-09-14 NOTE — Progress Notes (Signed)
Patient needing refill of lurasidone sent to [facility] in [place] which was sent.

## 2023-09-16 ENCOUNTER — Encounter (INDEPENDENT_AMBULATORY_CARE_PROVIDER_SITE_OTHER): Payer: Self-pay | Admitting: Family Medicine

## 2023-09-16 ENCOUNTER — Ambulatory Visit (INDEPENDENT_AMBULATORY_CARE_PROVIDER_SITE_OTHER): Payer: 59 | Admitting: Family Medicine

## 2023-09-16 VITALS — BP 148/84 | HR 88 | Temp 98.1°F | Ht 65.0 in | Wt 306.0 lb

## 2023-09-16 DIAGNOSIS — I10 Essential (primary) hypertension: Secondary | ICD-10-CM

## 2023-09-16 DIAGNOSIS — Z6841 Body Mass Index (BMI) 40.0 and over, adult: Secondary | ICD-10-CM

## 2023-09-16 DIAGNOSIS — R7303 Prediabetes: Secondary | ICD-10-CM

## 2023-09-16 NOTE — Progress Notes (Signed)
Carlye Grippe, DO, ABFM, ABOM Bariatric physician [address] [place], [place], [place] [address] Office: [phone]  /  Fax: [phone]     Initial Evaluation:  Kathy Rios was seen in clinic today to evaluate for obesity. She is interested in losing weight to improve overall health and reduce the risk of weight related complications. She presents today to review program treatment options, initial physical assessment, and evaluation.      She was referred by: PCP  When asked how has your weight affected you? She states: Having fatigue and Having poor endurance  Contributing factors to her weight change: Family history of obesity, Use of obesogenic medications: Obesogenic diabetes medications, Reduced physical activity, and nutritional .  Some associated conditions: Hypertension, Hyperlipidemia, and Prediabetes  Current nutrition plan: Low-carb and High-protein Lives with mom, dad, and uncle. Mom and uncle cook healthy food options for her. Opts for baked options, not fried foods.   Current level of physical activity: None  Current or previous pharmacotherapy:  Vyvanse and ozempic (started 11/2022-02/2023) and Zepbound (lost 26 lbs).  Response to medication: Had side effects so it was discontinued Pt reports n/v/d/c with Ozempic (n/v/c) and Zepbound (diarrhea).   Barriers to weight loss that patient expresses a concern about today: multiple competing priorities, low volume of physical activity at present , orthopedic problems, medical conditions or chronic pain affecting mobility, medical comorbidities, difficulty maintaining a reduced calorie state, and presence of obesogenic drugs.   What pt hopes to accomplish: Improve existing medical conditions, improve quality of life.   Past Medical History:  Diagnosis Date   ADD (attention deficit disorder)    ADHD (attention deficit hyperactivity disorder)    Anemia    Anxiety    Chronic constipation 03/08/2018   Depression     Diabetes mellitus    Diabetes mellitus, type II (HCC)    Encounter for menstrual regulation 01/25/2016   Generalized anxiety disorder 07/03/2022   Hypertension    Insomnia 06/27/2022   Irregular periods 10/25/2015   Major depressive disorder 03/22/2018   Obesity    Patient desires pregnancy 08/25/2022   PMS (premenstrual syndrome) 11/05/2015   Possible pregnancy 08/19/2022   PTSD (post-traumatic stress disorder)    Reactive hypoglycemia    Schizoaffective disorder, depressive type (HCC)    Screening examination for STD (sexually transmitted disease) 12/18/2021   Severe menstrual cramps 11/05/2015   Thalassemia trait    Thalassemia trait, alpha      Objective:  BP (!) 148/84   Pulse 88   Temp 98.1 F (36.7 C)   Ht 5\' 5"  (1.651 m)   Wt (!) 306 lb (138.8 kg)   SpO2 98%   BMI 50.92 kg/m  She was weighed on the bioimpedance scale: Body mass index is 50.92 kg/m.  Visceral Fat %: 16 , Body Fat %:  52.2  Vitals Temp: 98.1 F (36.7 C) BP: (!) 148/84 Pulse Rate: 88 SpO2: 98 %   Anthropometric Measurements Height: 5\' 5"  (1.651 m) Weight: (!) 306 lb (138.8 kg) BMI (Calculated): 50.92 Peak Weight: 337 lb   Body Composition  Body Fat %: 52.2 % Fat Mass (lbs): 160.2 lbs Muscle Mass (lbs): 139.2 lbs Total Body Water (lbs): 100.2 lbs Visceral Fat Rating : 16   Other Clinical Data Comments: info session     General: Well Developed, well nourished, and in no acute distress.  HEENT: Normocephalic, atraumatic; EOMI, sclerae are anicteric. Skin: Warm and dry, good turgor Chest:  Normal excursion, shape, no gross ABN  Respiratory: No conversational dyspnea; speaking in full sentences NeuroM-Sk:  Normal gross ROM * 4 extremities  Psych: A and O *3, insight adequate, mood- full    Assessment and Plan:   FOR THE DISEASE OF OBESITY: Obesity, morbid, BMI 50 or higher (HCC) - Current 50.92 Assessment & Plan: We reviewed anthropometrics, biometrics, associated medical  conditions and contributing factors with patient. Kathy Rios would benefit from a medically tailored reduced calorie nutrional plan based on his REE (resting energy expenditure), which will be determined by indirect calorimetry.  We will also assess for cardiometabolic risk and nutritional derangements via fasting labs at intake appointment.    Obesity Treatment / Action Plan:   She was weighed on the bioimpedance scale and results were discussed and documented in the synopsis.   Kathy Rios will complete provided nutritional and psychosocial assessment questionnaire before the next appointment.  She will be scheduled for indirect calorimetry to determine resting energy expenditure in a fasting state.  This will allow [place] to create a reduced calorie, high-protein meal plan to promote loss of fat mass while preserving muscle mass.  We will also assess for cardiometabolic risk and nutritional derangements via an ECG and fasting serologies at her next appointment.  She was encouraged to work on amassing support from family and friends to begin their weight loss journey.   Work on eliminating or reducing the presence of highly processed, poorly nutritious, calorie-dense foods in the home.   Obesity Education Performed Today:  Patient was counseled on nutritional approaches to weight loss and benefits of reducing processed foods and consuming plant-based foods and high quality protein as part of nutritional weight management program.   We discussed the importance of long term lifestyle changes which include nutrition, exercise and behavioral modifications as well as the importance of customizing this to her specific health and social needs.   We discussed the benefits of reaching a healthier weight to alleviate the symptoms of existing conditions and reduce the risks of the biomechanical, metabolic and psychological effects of obesity.  Was counseled on the health benefits of losing 5%-10% of  total body weight.  Was counseled on our cognitive behavorial therapy program, lead by our bariatric psychologist, who focuses on emotional eating and creating positive behavorial change.  Was counseled on bariatric pharmacotherapy and how this may be used as an adjunct in their weight management    Kathy Rios appears to be in the action stage of change and states they are ready to start intensive lifestyle modifications and behavioral modifications.  It was recommended that she follow up in the next 1-2 weeks to review the above steps, and to continue with treatment of their chronic disease state of obesity   FOR OTHER CONDITIONS RELATED TO THE DISEASE OF OBESITY: Essential hypertension Assessment & Plan: BP Readings from Last 3 Encounters:  09/16/23 (!) 148/84  09/07/23 133/82  07/06/23 138/87   HTN poorly controlled. BP was elevated at 148/84 pulse 88 today. Pt is complaint with Benicar 40 mg once daily. Pt monitoring BP at home and reports systolic readings in the upper 110s-130s.   Pt encouraged to follow a heart healthy diet. Continue with her current regimen per PCP/specialists. Advised her to continue following up with PCP/specialists as instructed by them.    Prediabetes Assessment & Plan: Lab Results  Component Value Date   HGBA1C 5.9 (H) 06/11/2023   HGBA1C 5.8 10/28/2022   HGBA1C 6.0 (H) 09/23/2022    Last A1C 3 weeks ago was 5.8. Per  pt the highest A1C she ever had was 6.3. Reports being obese/PreDM since childhood. Was treating PreDM with Metformin for about 19 years. Pt started Ozempic April 2024 and stopped due to constipation and vomiting. She was switched to Zepbound in July 2024 and reports diarrhea. Ultimately she discontinued to to high costs after insurance changes. Lost about 26 lbs on Zepbound.   Encouraged pt to continue to follow up with her PCP as directed by them. Pt informed that her PreDM will be monitored alongside PCP/specialists if pt desires to enroll  in program.    Attestations:   Reviewed by clinician on day of visit: allergies, medications, problem list, medical history, surgical history, family history, social history, and previous encounter notes pertinent to obesity diagnosis.   40 minutes was spent today on this visit including the above counseling, pre-visit chart review, and post-visit documentation.  Over 50% of this time was spent in direct, face-to-face counseling and coordination of care  I, Isabelle Course  acting as a medical scribe for Thomasene Lot, DO., have compiled all relevant documentation for today's office visit on behalf of Thomasene Lot, DO, while in the presence of [employer], DO.  I have reviewed the above documentation for accuracy and completeness, and I agree with the above. Carlye Grippe, D.O.  The 21st Century Cures Act was signed into law in 2016 which includes the topic of electronic health records.  This provides immediate access to information in MyChart.  This includes consultation notes, operative notes, office notes, lab results and pathology reports.  If you have any questions about what you read please let us know at your next visit so we can discuss your concerns and take corrective action if need be.  We are right here with you!

## 2023-09-17 ENCOUNTER — Telehealth (INDEPENDENT_AMBULATORY_CARE_PROVIDER_SITE_OTHER): Payer: 59 | Admitting: Psychiatry

## 2023-09-17 ENCOUNTER — Encounter (HOSPITAL_COMMUNITY): Payer: Self-pay | Admitting: Psychiatry

## 2023-09-17 DIAGNOSIS — Z79899 Other long term (current) drug therapy: Secondary | ICD-10-CM

## 2023-09-17 DIAGNOSIS — F4310 Post-traumatic stress disorder, unspecified: Secondary | ICD-10-CM | POA: Diagnosis not present

## 2023-09-17 DIAGNOSIS — Z6841 Body Mass Index (BMI) 40.0 and over, adult: Secondary | ICD-10-CM | POA: Diagnosis not present

## 2023-09-17 DIAGNOSIS — Z5181 Encounter for therapeutic drug level monitoring: Secondary | ICD-10-CM

## 2023-09-17 DIAGNOSIS — F900 Attention-deficit hyperactivity disorder, predominantly inattentive type: Secondary | ICD-10-CM

## 2023-09-17 DIAGNOSIS — F250 Schizoaffective disorder, bipolar type: Secondary | ICD-10-CM

## 2023-09-17 MED ORDER — LISDEXAMFETAMINE DIMESYLATE 60 MG PO CAPS: 60.0000 mg | ORAL_CAPSULE | Freq: Every day | ORAL | 0 refills | Status: DC

## 2023-09-17 MED ORDER — CLONIDINE HCL 0.1 MG PO TABS: 0.1000 mg | ORAL_TABLET | Freq: Every evening | ORAL | 2 refills | Status: DC
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
## 2023-09-17 NOTE — Patient Instructions (Signed)
We added clonidine back to your regimen today. Take clonidine 0.1mg  nightly and this should help with the blood pressure and insomnia.

## 2023-09-21 ENCOUNTER — Other Ambulatory Visit: Payer: Self-pay

## 2023-09-21 DIAGNOSIS — Z0182 Encounter for allergy testing: Secondary | ICD-10-CM

## 2023-09-21 DIAGNOSIS — F4389 Other reactions to severe stress: Secondary | ICD-10-CM | POA: Diagnosis not present

## 2023-09-21 DIAGNOSIS — F28 Other psychotic disorder not due to a substance or known physiological condition: Secondary | ICD-10-CM | POA: Diagnosis not present

## 2023-09-21 NOTE — Telephone Encounter (Signed)
 Referral placed.

## 2023-09-28 DIAGNOSIS — F4389 Other reactions to severe stress: Secondary | ICD-10-CM | POA: Diagnosis not present

## 2023-09-28 DIAGNOSIS — F28 Other psychotic disorder not due to a substance or known physiological condition: Secondary | ICD-10-CM | POA: Diagnosis not present

## 2023-09-29 ENCOUNTER — Telehealth (INDEPENDENT_AMBULATORY_CARE_PROVIDER_SITE_OTHER): Payer: 59 | Admitting: Psychiatry

## 2023-09-29 ENCOUNTER — Encounter (HOSPITAL_COMMUNITY): Payer: Self-pay | Admitting: Psychiatry

## 2023-09-29 DIAGNOSIS — F250 Schizoaffective disorder, bipolar type: Secondary | ICD-10-CM

## 2023-09-29 DIAGNOSIS — Z6841 Body Mass Index (BMI) 40.0 and over, adult: Secondary | ICD-10-CM | POA: Diagnosis not present

## 2023-09-29 DIAGNOSIS — F4310 Post-traumatic stress disorder, unspecified: Secondary | ICD-10-CM | POA: Diagnosis not present

## 2023-09-29 DIAGNOSIS — F900 Attention-deficit hyperactivity disorder, predominantly inattentive type: Secondary | ICD-10-CM

## 2023-09-29 DIAGNOSIS — Z0289 Encounter for other administrative examinations: Secondary | ICD-10-CM

## 2023-09-29 DIAGNOSIS — Z5181 Encounter for therapeutic drug level monitoring: Secondary | ICD-10-CM

## 2023-09-29 DIAGNOSIS — Z79899 Other long term (current) drug therapy: Secondary | ICD-10-CM

## 2023-09-29 MED ORDER — LISDEXAMFETAMINE DIMESYLATE 60 MG PO CAPS: 60.0000 mg | ORAL_CAPSULE | Freq: Every day | ORAL | 0 refills | Status: DC

## 2023-09-29 NOTE — Patient Instructions (Signed)
We didn't make any medication changes today. Keep up the good work!
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
## 2023-09-30 ENCOUNTER — Other Ambulatory Visit: Payer: Self-pay

## 2023-09-30 ENCOUNTER — Ambulatory Visit: Payer: 59 | Admitting: Allergy & Immunology

## 2023-09-30 ENCOUNTER — Encounter: Payer: Self-pay | Admitting: Allergy & Immunology

## 2023-09-30 VITALS — BP 128/68 | HR 104 | Temp 98.7°F | Ht 64.57 in | Wt 306.0 lb

## 2023-09-30 DIAGNOSIS — T7803XD Anaphylactic reaction due to other fish, subsequent encounter: Secondary | ICD-10-CM

## 2023-09-30 DIAGNOSIS — J310 Chronic rhinitis: Secondary | ICD-10-CM | POA: Diagnosis not present

## 2023-09-30 NOTE — Patient Instructions (Addendum)
1. Seafood allergy, anaphylaxis - We will get some lab work to look for a seafood allergy. - We can confirm this with testing on the skin at the next visit. - EpiPen deferred until we get the skin testing and the blood testing results back.   2. Chronic rhinitis - Because of insurance stipulations, we cannot do skin testing on the same day as your first visit. - We are all working to fight this, but for now we need to do two separate visits.  - We will know more after we do testing at the next visit.  - The skin testing visit can be squeezed in at your convenience.  - Then we can make a more full plan to address all of your symptoms. - Be sure to stop your antihistamines for 3 days before this appointment.   3. Return in about 1 week (around 10/07/2023) for ALLERGY TESTING (1-55 + SEAFOOD). You can have the follow up appointment with Dr. Dellis Anes or a Nurse Practicioner (our Nurse Practitioners are excellent and always have Physician oversight!).    Please inform us of any Emergency Department visits, hospitalizations, or changes in symptoms. Call us before going to the ED for breathing or allergy symptoms since we might be able to fit you in for a sick visit. Feel free to contact us anytime with any questions, problems, or concerns.  It was a pleasure to meet you today!  Websites that have reliable patient information: 1. American Academy of Asthma, Allergy, and Immunology: www.aaaai.org 2. Food Allergy Research and Education (FARE): foodallergy.org 3. Mothers of Asthmatics: http://www.asthmacommunitynetwork.org 4. American College of Allergy, Asthma, and Immunology: www.acaai.org      "Like" [place] on Facebook and Instagram for our latest updates!      A healthy democracy works best when [employer] participate! Make sure you are registered to vote! If you have moved or changed any of your contact information, you will need to get this updated before voting! Scan the QR codes below to  learn more!

## 2023-09-30 NOTE — Progress Notes (Signed)
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 Seafood allergy, anaphylaxis - We will get some lab work to look for a seafood allergy. - We can confirm this with testing on the skin at the next visit. - EpiPen deferred until we get the skin testing and the blood testing results back.   2. Chronic rhinitis - Because of insurance stipulations, we cannot do skin testing on the same day as your first visit. - We are all working to fight this, but for now we need to do two separate visits.  - We will know more after we do testing at the next visit.  - The skin testing visit can be squeezed in at your convenience.  - Then we can make a more full plan to address all of your symptoms. - Be sure to stop your antihistamines for 3 days before this appointment.   3. Return in about 1 week (around 10/07/2023) for ALLERGY TESTING (1-55 + SEAFOOD). You can have the follow up appointment with Dr. Dellis Anes or a Nurse Practicioner (our Nurse Practitioners are excellent and always have Physician oversight!).    This note in its entirety was forwarded to the Provider who requested this consultation.  Subjective:   Kathy Rios is a 23 y.o. female presenting today for evaluation of  Chief Complaint  Patient presents with   Allergy Testing   Allergic Reaction    States that she may have had an allergic reaction to shellfish   Rash   Pruritus    Kathy Rios has a history of the following: Patient Active Problem List   Diagnosis Date Noted   Class 3 severe obesity due to excess calories with body mass index (BMI) of 60.0 to 69.9 in adult [facility]) 09/07/2023    Vitamin D deficiency 07/06/2023   Long-term current use of stimulant therapy 06/16/2023   Decreased appetite 02/10/2023   Need for HPV vaccination 02/10/2023   Encounter for initial prescription of contraceptive pills 11/24/2022   Pregnancy examination or test, negative result 08/25/2022   Need for immunization against influenza 07/11/2022   Schizoaffective disorder, bipolar type (HCC) 07/03/2022   PTSD (post-traumatic stress disorder) 07/03/2022   Long term current use of antipsychotic medication 07/03/2022   Encounter for well woman exam with routine gynecological exam 05/12/2022   Routine general medical examination at a health care facility 04/29/2021   Encounter for surveillance of contraceptive pills 04/29/2021   Morbid obesity (HCC) 04/29/2021   Seasonal allergies 02/25/2021   Food allergy 02/25/2021   Adult BMI 50.0-59.9 kg/sq m (HCC) 08/16/2018   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  Discussed the use of AI scribe software for clinical note transcription with the patient and/or guardian, who gave verbal consent to proceed.  Kathy Rios was referred  by Billie Lade, MD.     Kathy Rios is a 23 y.o. female presenting for an evaluation of a possible seafood allergy .  She presents with a rash and itching after eating lobster. She was referred by their sister, Dr. Durwin Nora, for evaluation of a possible allergic reaction.  She developed itching and a rash approximately three hours after consuming lobster from a food  truck in [place]. The rash was described as not very severe but 'super itchy.' The symptoms did not occur immediately but developed hours after eating the lobster.  She also consumed more lobster at home later that day.  She has a history of eating other seafood such as shrimp, oysters, and fish without any previous allergic reactions. Recently, she consumed lobster bits from [place], which did not cause any itching or rash, leading them to question if the reaction was due to something else they ate. No other allergic reactions to seafood in the past.  She tracks her food intake using an app since 2021, which helps them monitor their diet.   She has a history of environmental allergies but do not have asthma. They have used cetirizine in the past for allergies but have not needed it for the last five years. No asthma or need for albuterol.  She has been on Zepbound for weight loss, but this did not drive with her Vyvanse.  She has lost around 30 pounds since her weight loss journey began.  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 They have lived in their current location for about ten years.  There are no changes to her living situation.  Otherwise, there is no history of other atopic diseases, including drug allergies, stinging insect allergies, or contact dermatitis. There is no significant infectious history. Vaccinations are up to date.    Past Medical History: Patient Active Problem List   Diagnosis Date Noted   Class 3 severe obesity due to excess calories with body mass index (BMI) of 60.0 to 69.9 in adult [facility]) 09/07/2023   Vitamin D deficiency 07/06/2023   Long-term current use of stimulant therapy 06/16/2023   Decreased appetite 02/10/2023   Need for HPV vaccination 02/10/2023   Encounter for initial prescription of contraceptive pills 11/24/2022   Pregnancy examination or test, negative result 08/25/2022   Need  for immunization against influenza 07/11/2022   Schizoaffective disorder, bipolar type (HCC) 07/03/2022   PTSD (post-traumatic stress disorder) 07/03/2022   Long term current use of antipsychotic medication 07/03/2022   Encounter for well woman exam with routine gynecological exam 05/12/2022   Routine general medical examination at a health care facility 04/29/2021   Encounter for surveillance of contraceptive pills 04/29/2021   Morbid obesity (HCC) 04/29/2021   Seasonal allergies 02/25/2021   Food allergy 02/25/2021   Adult BMI 50.0-59.9 kg/sq m (HCC) 08/16/2018   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   Acanthosis nigricans 01/28/2017   Prediabetes 07/01/2016   Encounter for menstrual regulation 01/25/2016   Severe menstrual cramps 11/05/2015   PMS (premenstrual syndrome) 11/05/2015   Irregular periods 10/25/2015   Attention deficit hyperactivity disorder (ADHD), predominantly inattentive type 12/23/2014   Tinea versicolor 12/23/2014   Alpha thalassemia trait 08/20/2012   Essential hypertension 08/20/2012    Medication List:  Allergies as of 09/30/2023       Reactions   Selenium Sulfide Rash   Amoxicillin    Banana Other (See Comments)   headache   Motrin [  ibuprofen] Hives, Itching   Sulfa Antibiotics Rash        Medication List        Accurate as of September 30, 2023 12:24 PM. If you have any questions, ask your nurse or doctor.          acetaminophen 500 MG tablet Commonly known as: TYLENOL Take 1,000 mg by mouth every 6 (six) hours as needed for mild pain or headache.   ascorbic acid 500 MG tablet Commonly known as: VITAMIN C Take 500 mg by mouth daily.   Aurovela 24 FE 1-20 MG-MCG(24) tablet Generic drug: Norethindrone Acetate-Ethinyl Estrad-FE Take 1 tablet by mouth daily.   b complex vitamins capsule Take 1 capsule by mouth.   cloNIDine 0.1 MG tablet Commonly known as: CATAPRES Take 1 tablet (0.1 mg total) by mouth at bedtime.   ferrous sulfate 325  (65 FE) MG tablet Take 325 mg by mouth daily with breakfast.   lisdexamfetamine 60 MG capsule Commonly known as: Vyvanse Take 1 capsule (60 mg total) by mouth daily. Start taking on: October 17, 2023   lurasidone 40 MG Tabs tablet Commonly known as: LATUDA Take 1 tablet (40 mg total) by mouth at bedtime. With meal.   olmesartan 40 MG tablet Commonly known as: BENICAR TAKE 1 TABLET(40 MG) BY MOUTH DAILY   prenatal vitamin w/FE, FA 27-1 MG Tabs tablet Take 1 tablet by mouth daily at 12 noon.   UNABLE TO FIND Omega 3-takes 1 tab daily   Vitamin D (Ergocalciferol) 1.25 MG (50000 UNIT) Caps capsule Commonly known as: DRISDOL Take 1 capsule (50,000 Units total) by mouth every 7 (seven) days.        Birth History: non-contributory  Developmental History: non-contributory  Past Surgical History: Past Surgical History:  Procedure Laterality Date   ADENOIDECTOMY     TONSILLECTOMY     WISDOM TOOTH EXTRACTION  07/2016     Family History: Family History  Problem Relation Age of Onset   Urticaria Mother    Asthma Mother    Allergic rhinitis Mother    Seizures Mother    Arthritis Mother    Diabetes Mother    Hyperlipidemia Mother    Depression Mother    Hyperlipidemia Father    Hypertension Father    Gout Father    Dementia Father    Hypertension Sister    Eczema Brother    Mental illness Brother    Hyperlipidemia Brother    ADD / ADHD Brother    Depression Brother    Schizophrenia Maternal Aunt    Bipolar disorder Maternal Aunt    Alcohol abuse Maternal Uncle    Hypertension Maternal Grandmother    Cancer Maternal Grandmother    Hypertension Maternal Grandfather    Thyroid disease Maternal Grandfather    Cancer Maternal Grandfather    Prostate cancer Maternal Grandfather    Anemia Paternal Grandmother    Cancer Paternal Grandmother        cervical   Hypertension Paternal Grandfather    Heart disease Paternal Grandfather    Hypertension Other    Other  Other        heart skips-maternal great grandma   Other Other        fibroids- maternal grandma and great grandma   Heart disease Other      Social History: Kathy Rios lives at home with her family.  There are no animals in the home.  Their house is 24 years old.  There is carpeting throughout the  home.  They have electric heating and central cooling.  There are cats inside of the home.  These are not new to the environment.  There are no dust mite covers on the bedding.  There is no tobacco exposure except in the house.  She does not smoke, but someone else with her family does.   Review of systems otherwise negative other than that mentioned in the HPI.    Objective:   Blood pressure 128/68, pulse (!) 104, temperature 98.7 F (37.1 C), height 5' 4.57" (1.64 m), weight (!) 306 lb (138.8 kg), SpO2 98%. Body mass index is 51.61 kg/m.     Physical Exam Vitals reviewed.  Constitutional:      Appearance: She is well-developed.     Comments: Pleasant.  Cooperative with exam.  Friendly.  HENT:     Head: Normocephalic and atraumatic.     Right Ear: Tympanic membrane, ear canal and external ear normal. No drainage, swelling or tenderness. Tympanic membrane is not injected, scarred, erythematous, retracted or bulging.     Left Ear: Tympanic membrane, ear canal and external ear normal. No drainage, swelling or tenderness. Tympanic membrane is not injected, scarred, erythematous, retracted or bulging.     Nose: No nasal deformity, septal deviation, mucosal edema or rhinorrhea.     Right Turbinates: Enlarged, swollen and pale.     Left Turbinates: Enlarged, swollen and pale.     Right Sinus: No maxillary sinus tenderness or frontal sinus tenderness.     Left Sinus: No maxillary sinus tenderness or frontal sinus tenderness.     Mouth/Throat:     Mouth: Mucous membranes are not pale and not dry.     Pharynx: Uvula midline.  Eyes:     General:        Right eye: No discharge.        Left  eye: No discharge.     Conjunctiva/sclera: Conjunctivae normal.     Right eye: Right conjunctiva is not injected. No chemosis.    Left eye: Left conjunctiva is not injected. No chemosis.    Pupils: Pupils are equal, round, and reactive to light.  Cardiovascular:     Rate and Rhythm: Normal rate and regular rhythm.     Heart sounds: Normal heart sounds.  Pulmonary:     Effort: Pulmonary effort is normal. No tachypnea, accessory muscle usage or respiratory distress.     Breath sounds: Normal breath sounds. No wheezing, rhonchi or rales.     Comments: Moving air well in all lung fields.  No increased work of breathing. Chest:     Chest wall: No tenderness.  Abdominal:     Tenderness: There is no abdominal tenderness. There is no guarding or rebound.  Lymphadenopathy:     Head:     Right side of head: No submandibular, tonsillar or occipital adenopathy.     Left side of head: No submandibular, tonsillar or occipital adenopathy.     Cervical: No cervical adenopathy.  Skin:    Coloration: Skin is not pale.     Findings: No abrasion, erythema, petechiae or rash. Rash is not papular, urticarial or vesicular.  Neurological:     Mental Status: She is alert.  Psychiatric:        Behavior: Behavior is cooperative.      Diagnostic studies: labs sent instead          Malachi Bonds, MD [facility]

## 2023-10-01 ENCOUNTER — Encounter (HOSPITAL_COMMUNITY): Payer: Self-pay

## 2023-10-02 ENCOUNTER — Ambulatory Visit: Payer: 59 | Admitting: Internal Medicine

## 2023-10-03 LAB — ALLERGY PANEL 19, SEAFOOD GROUP
Allergen Salmon IgE: <0.1 kU/L
Catfish: <0.1 kU/L
Codfish IgE: <0.1 kU/L
F023-IgE Crab: <0.1 kU/L
F080-IgE Lobster: <0.1 kU/L
Shrimp IgE: <0.1 kU/L
Tuna: <0.1 kU/L

## 2023-10-05 ENCOUNTER — Telehealth: Payer: 59 | Admitting: Physician Assistant

## 2023-10-05 DIAGNOSIS — J029 Acute pharyngitis, unspecified: Secondary | ICD-10-CM

## 2023-10-05 DIAGNOSIS — J00 Acute nasopharyngitis [common cold]: Secondary | ICD-10-CM | POA: Diagnosis not present

## 2023-10-05 DIAGNOSIS — Z20822 Contact with and (suspected) exposure to covid-19: Secondary | ICD-10-CM | POA: Diagnosis not present

## 2023-10-05 DIAGNOSIS — R059 Cough, unspecified: Secondary | ICD-10-CM | POA: Diagnosis not present

## 2023-10-05 NOTE — Progress Notes (Signed)
Virtual Visit Consent   Kathy Rios, you are scheduled for a virtual visit with a [facility] provider today. Just as with appointments in the office, your consent must be obtained to participate. Your consent will be active for this visit and any virtual visit you may have with one of our providers in the next 365 days. If you have a MyChart account, a copy of this consent can be sent to you electronically.  As this is a virtual visit, video technology does not allow for your provider to perform a traditional examination. This may limit your provider's ability to fully assess your condition. If your provider identifies any concerns that need to be evaluated in person or the need to arrange testing (such as labs, EKG, etc.), we will make arrangements to do so. Although advances in technology are sophisticated, we cannot ensure that it will always work on either your end or our end. If the connection with a video visit is poor, the visit may have to be switched to a telephone visit. With either a video or telephone visit, we are not always able to ensure that we have a secure connection.  By engaging in this virtual visit, you consent to the provision of healthcare and authorize for your insurance to be billed (if applicable) for the services provided during this visit. Depending on your insurance coverage, you may receive a charge related to this service.  I need to obtain your verbal consent now. Are you willing to proceed with your visit today? Kathy Rios has provided verbal consent on 10/05/2023 for a virtual visit (video or telephone). Kathy Rios, [place]  Date: 10/05/2023 9:32 AM   Virtual Visit via Video Note   I, Kathy Rios, connected with  Kathy Rios  ([id number], 2001/04/26) on 10/05/23 at  9:30 AM EST by a video-enabled telemedicine application and verified that I am speaking with the correct person using two identifiers.  Location: Patient: Virtual Visit Location Patient:  Home Provider: Virtual Visit Location Provider: Home Office   I discussed the limitations of evaluation and management by telemedicine and the availability of in person appointments. The patient expressed understanding and agreed to proceed.    History of Present Illness: Kathy Rios is a 23 y.o. who identifies as a female who was assigned female at birth, and is being seen today for sore throat.  HPI: Sore Throat  This is a new problem. The current episode started yesterday. The problem has been rapidly worsening. There has been no fever. The pain is at a severity of 10/10. Associated symptoms include a hoarse voice. Pertinent negatives include no drooling, ear pain or headaches. She has tried nothing for the symptoms. The treatment provided no relief.    Problems:  Patient Active Problem List   Diagnosis Date Noted   Class 3 severe obesity due to excess calories with body mass index (BMI) of 60.0 to 69.9 in adult [facility]) 09/07/2023   Vitamin D deficiency 07/06/2023   Long-term current use of stimulant therapy 06/16/2023   Decreased appetite 02/10/2023   Need for HPV vaccination 02/10/2023   Encounter for initial prescription of contraceptive pills 11/24/2022   Pregnancy examination or test, negative result 08/25/2022   Need for immunization against influenza 07/11/2022   Schizoaffective disorder, bipolar type (HCC) 07/03/2022   PTSD (post-traumatic stress disorder) 07/03/2022   Long term current use of antipsychotic medication 07/03/2022   Encounter for well woman exam with routine gynecological exam 05/12/2022   Routine general medical examination  at a health care facility 04/29/2021   Encounter for surveillance of contraceptive pills 04/29/2021   Morbid obesity (HCC) 04/29/2021   Seasonal allergies 02/25/2021   Food allergy 02/25/2021   Adult BMI 50.0-59.9 kg/sq m (HCC) 08/16/2018   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   Acanthosis nigricans 01/28/2017   Prediabetes 07/01/2016    Encounter for menstrual regulation 01/25/2016   Severe menstrual cramps 11/05/2015   PMS (premenstrual syndrome) 11/05/2015   Irregular periods 10/25/2015   Attention deficit hyperactivity disorder (ADHD), predominantly inattentive type 12/23/2014   Tinea versicolor 12/23/2014   Alpha thalassemia trait 08/20/2012   Essential hypertension 08/20/2012    Allergies:  Allergies  Allergen Reactions   Selenium Sulfide Rash   Amoxicillin    Banana Other (See Comments)    headache   Motrin [Ibuprofen] Hives and Itching   Sulfa Antibiotics Rash   Medications:  Current Outpatient Medications:    acetaminophen (TYLENOL) 500 MG tablet, Take 1,000 mg by mouth every 6 (six) hours as needed for mild pain or headache., Disp: , Rfl:    ascorbic acid (VITAMIN C) 500 MG tablet, Take 500 mg by mouth daily., Disp: , Rfl:    b complex vitamins capsule, Take 1 capsule by mouth., Disp: , Rfl:    cloNIDine (CATAPRES) 0.1 MG tablet, Take 1 tablet (0.1 mg total) by mouth at bedtime., Disp: 30 tablet, Rfl: 2   ferrous sulfate 325 (65 FE) MG tablet, Take 325 mg by mouth daily with breakfast., Disp: , Rfl:    [START ON 10/17/2023] lisdexamfetamine (VYVANSE) 60 MG capsule, Take 1 capsule (60 mg total) by mouth daily., Disp: 30 capsule, Rfl: 0   lurasidone (LATUDA) 40 MG TABS tablet, Take 1 tablet (40 mg total) by mouth at bedtime. With meal., Disp: 30 tablet, Rfl: 2   Norethindrone Acetate-Ethinyl Estrad-FE (AUROVELA 24 FE) 1-20 MG-MCG(24) tablet, Take 1 tablet by mouth daily., Disp: , Rfl:    olmesartan (BENICAR) 40 MG tablet, TAKE 1 TABLET(40 MG) BY MOUTH DAILY, Disp: 30 tablet, Rfl: 2   prenatal vitamin w/FE, FA (PRENATAL 1 + 1) 27-1 MG TABS tablet, Take 1 tablet by mouth daily at 12 noon., Disp: 30 tablet, Rfl: 12   UNABLE TO FIND, Omega 3-takes 1 tab daily, Disp: , Rfl:    Vitamin D, Ergocalciferol, (DRISDOL) 1.25 MG (50000 UNIT) CAPS capsule, Take 1 capsule (50,000 Units total) by mouth every 7 (seven) days.,  Disp: 10 capsule, Rfl: 1  Observations/Objective: Patient is well-developed, well-nourished in no acute distress.  Resting comfortably at home.  Head is normocephalic, atraumatic.  No labored breathing.  Speech is clear and coherent with logical content.  Patient is alert and oriented at baseline.  Horase sounding voice and slow speech on exam   Assessment and Plan: 1. Sore throat (Primary)  Patient with audible sore throat, no tripoding, no shortness of breath however she states it hurts very bad to swallow. Unable to obtain a good visual and recommended in person assessment for evaluation and diagnostics to rule out strep, PTA, or any other emergent conditions. She verbalized understanding and agreement with plan to be seen in person.   Follow Up Instructions: I discussed the assessment and treatment plan with the patient. The patient was provided an opportunity to ask questions and all were answered. The patient agreed with the plan and demonstrated an understanding of the instructions.  A copy of instructions were sent to the patient via MyChart unless otherwise noted below.     The patient  was advised to call back or seek an in-person evaluation if the symptoms worsen or if the condition fails to improve as anticipated.    Kathy Kidney, PA-C

## 2023-10-07 ENCOUNTER — Ambulatory Visit: Payer: 59 | Admitting: Allergy & Immunology

## 2023-10-07 DIAGNOSIS — F4389 Other reactions to severe stress: Secondary | ICD-10-CM | POA: Diagnosis not present

## 2023-10-07 DIAGNOSIS — F28 Other psychotic disorder not due to a substance or known physiological condition: Secondary | ICD-10-CM | POA: Diagnosis not present

## 2023-10-07 NOTE — Telephone Encounter (Signed)
 Dr John Giovanni pt

## 2023-10-08 ENCOUNTER — Encounter: Payer: Self-pay | Admitting: Internal Medicine

## 2023-10-08 ENCOUNTER — Telehealth (INDEPENDENT_AMBULATORY_CARE_PROVIDER_SITE_OTHER): Payer: 59 | Admitting: Internal Medicine

## 2023-10-08 ENCOUNTER — Encounter: Payer: Self-pay | Admitting: Allergy & Immunology

## 2023-10-08 DIAGNOSIS — J040 Acute laryngitis: Secondary | ICD-10-CM | POA: Diagnosis not present

## 2023-10-08 DIAGNOSIS — I10 Essential (primary) hypertension: Secondary | ICD-10-CM | POA: Diagnosis not present

## 2023-10-08 MED ORDER — OLMESARTAN MEDOXOMIL 40 MG PO TABS: 40.0000 mg | ORAL_TABLET | Freq: Every day | ORAL | 2 refills | Status: DC

## 2023-10-08 MED ORDER — PREDNISONE 20 MG PO TABS: 20.0000 mg | ORAL_TABLET | Freq: Every day | ORAL | 0 refills | Status: DC

## 2023-10-08 NOTE — Progress Notes (Signed)
Virtual Visit via Video Note  I connected with Kathy Rios on 10/08/23 at 10:40 AM EST by a video enabled telemedicine application and verified that I am speaking with the correct person using two identifiers.  Patient Location: Home Provider Location: Office/Clinic  I discussed the limitations, risks, security, and privacy concerns of performing an evaluation and management service by video and the availability of in person appointments. I also discussed with the patient that there may be a patient responsible charge related to this service. The patient expressed understanding and agreed to proceed.  Subjective: PCP: Billie Lade, MD  Chief Complaint  Patient presents with   URI    URI sx, with laryngitis. Negative covid/flu test    Kathy Rios has been evaluated today through video encounter for an acute visit endorsing sore throat and hoarseness.  Symptoms began on 2/14.  She presented to urgent care on 2/17 and tested negative for COVID and flu.  She was diagnosed with laryngitis.  No antibiotics were prescribed.  She has been managing her symptoms with DayQuil NyQuil and Mucinex lozenges.  She denies fever/chills, nausea/vomiting, and diarrhea.  She endorses a cough with scant sputum production.  Overall, she feels that her symptoms are improving but her voice remains hoarse.  She has maintained adequate oral intake and denies significant discomfort with solids and liquids.  ROS: Per HPI  Current Outpatient Medications:    predniSONE (DELTASONE) 20 MG tablet, Take 1 tablet (20 mg total) by mouth daily with breakfast for 5 days., Disp: 5 tablet, Rfl: 0   acetaminophen (TYLENOL) 500 MG tablet, Take 1,000 mg by mouth every 6 (six) hours as needed for mild pain or headache., Disp: , Rfl:    ascorbic acid (VITAMIN C) 500 MG tablet, Take 500 mg by mouth daily., Disp: , Rfl:    b complex vitamins capsule, Take 1 capsule by mouth., Disp: , Rfl:    cloNIDine (CATAPRES) 0.1 MG tablet,  Take 1 tablet (0.1 mg total) by mouth at bedtime., Disp: 30 tablet, Rfl: 2   ferrous sulfate 325 (65 FE) MG tablet, Take 325 mg by mouth daily with breakfast., Disp: , Rfl:    [START ON 10/17/2023] lisdexamfetamine (VYVANSE) 60 MG capsule, Take 1 capsule (60 mg total) by mouth daily., Disp: 30 capsule, Rfl: 0   lurasidone (LATUDA) 40 MG TABS tablet, Take 1 tablet (40 mg total) by mouth at bedtime. With meal., Disp: 30 tablet, Rfl: 2   Norethindrone Acetate-Ethinyl Estrad-FE (AUROVELA 24 FE) 1-20 MG-MCG(24) tablet, Take 1 tablet by mouth daily., Disp: , Rfl:    olmesartan (BENICAR) 40 MG tablet, Take 1 tablet (40 mg total) by mouth daily., Disp: 30 tablet, Rfl: 2   prenatal vitamin w/FE, FA (PRENATAL 1 + 1) 27-1 MG TABS tablet, Take 1 tablet by mouth daily at 12 noon., Disp: 30 tablet, Rfl: 12   UNABLE TO FIND, Omega 3-takes 1 tab daily, Disp: , Rfl:    Vitamin D, Ergocalciferol, (DRISDOL) 1.25 MG (50000 UNIT) CAPS capsule, Take 1 capsule (50,000 Units total) by mouth every 7 (seven) days., Disp: 10 capsule, Rfl: 1  Assessment and Plan:  Acute laryngitis Assessment & Plan: Evaluated today for an acute visit through virtual encounter in the setting of laryngitis.  Recent urgent care presentation 2/17.  COVID/flu negative.  She has been adhering to supportive care measures.  Overall symptoms are improving but she remains hoarse.  Denies shortness of breath.  No muffled voice appreciated today.  We reviewed  that laryngitis is not typically caused by bacterial infection.  Antibiotics not indicated at this time.  Given persistent hoarseness, will add prednisone 20 mg daily x 5 days.  Recommend continued supportive care measures.  She was instructed to return to care if symptoms worsen or fail to improve.  Otherwise, she is scheduled for routine follow-up in May.  Follow Up Instructions: Return if symptoms worsen or fail to improve.   I discussed the assessment and treatment plan with the patient. The  patient was provided an opportunity to ask questions, and all were answered. The patient agreed with the plan and demonstrated an understanding of the instructions.   The patient was advised to call back or seek an in-person evaluation if the symptoms worsen or if the condition fails to improve as anticipated.  The above assessment and management plan was discussed with the patient. The patient verbalized understanding of and has agreed to the management plan.   Billie Lade, MD

## 2023-10-08 NOTE — Assessment & Plan Note (Signed)
Evaluated today for an acute visit through virtual encounter in the setting of laryngitis.  Recent urgent care presentation 2/17.  COVID/flu negative.  She has been adhering to supportive care measures.  Overall symptoms are improving but she remains hoarse.  Denies shortness of breath.  No muffled voice appreciated today.  We reviewed that laryngitis is not typically caused by bacterial infection.  Antibiotics not indicated at this time.  Given persistent hoarseness, will add prednisone 20 mg daily x 5 days.  Recommend continued supportive care measures.  She was instructed to return to care if symptoms worsen or fail to improve.  Otherwise, she is scheduled for routine follow-up in May.

## 2023-10-13 ENCOUNTER — Telehealth (INDEPENDENT_AMBULATORY_CARE_PROVIDER_SITE_OTHER): Payer: 59 | Admitting: Psychiatry

## 2023-10-13 ENCOUNTER — Encounter (HOSPITAL_COMMUNITY): Payer: Self-pay | Admitting: Psychiatry

## 2023-10-13 DIAGNOSIS — N943 Premenstrual tension syndrome: Secondary | ICD-10-CM | POA: Diagnosis not present

## 2023-10-13 DIAGNOSIS — Z79899 Other long term (current) drug therapy: Secondary | ICD-10-CM

## 2023-10-13 DIAGNOSIS — Z5181 Encounter for therapeutic drug level monitoring: Secondary | ICD-10-CM

## 2023-10-13 DIAGNOSIS — F900 Attention-deficit hyperactivity disorder, predominantly inattentive type: Secondary | ICD-10-CM | POA: Diagnosis not present

## 2023-10-13 DIAGNOSIS — F250 Schizoaffective disorder, bipolar type: Secondary | ICD-10-CM | POA: Diagnosis not present

## 2023-10-13 DIAGNOSIS — F4310 Post-traumatic stress disorder, unspecified: Secondary | ICD-10-CM | POA: Diagnosis not present

## 2023-10-13 NOTE — Patient Instructions (Signed)
 We did not make any medication changes today. If you want to let your OB make a birth control change to a low progesterone content birth control pill before considering another SSRI to address the PMS that is ok. Please let Dr. Durwin Nora know about the ongoing fast heart rate.
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
## 2023-10-14 DIAGNOSIS — F28 Other psychotic disorder not due to a substance or known physiological condition: Secondary | ICD-10-CM | POA: Diagnosis not present

## 2023-10-14 DIAGNOSIS — F4389 Other reactions to severe stress: Secondary | ICD-10-CM | POA: Diagnosis not present

## 2023-10-19 ENCOUNTER — Other Ambulatory Visit (HOSPITAL_COMMUNITY)
Admission: RE | Admit: 2023-10-19 | Discharge: 2023-10-19 | Disposition: A | Discharge status: Home / Self Care | Source: Ambulatory Visit | Attending: Obstetrics and Gynecology | Admitting: Obstetrics and Gynecology

## 2023-10-19 ENCOUNTER — Encounter: Payer: Self-pay | Admitting: Obstetrics and Gynecology

## 2023-10-19 ENCOUNTER — Ambulatory Visit (INDEPENDENT_AMBULATORY_CARE_PROVIDER_SITE_OTHER): Payer: 59 | Admitting: Obstetrics and Gynecology

## 2023-10-19 VITALS — BP 134/83 | HR 90 | Ht 65.0 in | Wt 299.0 lb

## 2023-10-19 DIAGNOSIS — N943 Premenstrual tension syndrome: Secondary | ICD-10-CM

## 2023-10-19 DIAGNOSIS — Z113 Encounter for screening for infections with a predominantly sexual mode of transmission: Secondary | ICD-10-CM | POA: Insufficient documentation

## 2023-10-19 DIAGNOSIS — N760 Acute vaginitis: Secondary | ICD-10-CM | POA: Diagnosis not present

## 2023-10-19 DIAGNOSIS — F28 Other psychotic disorder not due to a substance or known physiological condition: Secondary | ICD-10-CM | POA: Diagnosis not present

## 2023-10-19 DIAGNOSIS — B9689 Other specified bacterial agents as the cause of diseases classified elsewhere: Secondary | ICD-10-CM | POA: Insufficient documentation

## 2023-10-19 DIAGNOSIS — F4389 Other reactions to severe stress: Secondary | ICD-10-CM | POA: Diagnosis not present

## 2023-10-19 MED ORDER — SLYND 4 MG PO TABS: 1.0000 | ORAL_TABLET | Freq: Every day | ORAL | 3 refills | Status: DC

## 2023-10-19 NOTE — Progress Notes (Signed)
 23 y.o. New GYN presents for AEX/PAP/STD screening.  Pt wants to get her PAP done yearly because her Grandmothers had cervical and uterine Cancers.  She also wants to switch her OCP to a higher dosage.  C/o lump under her armpit.

## 2023-10-19 NOTE — Progress Notes (Signed)
   RETURN GYNECOLOGY VISIT  Subjective:  Kathy Rios is a 23 y.o. G0P0000 on COCPs (norethindrone-EE 1-20) with significant mental health history and history of HTN presenting for possible PMDD  Pt follows with psychiatry for schizoaffective disorder, depression, ADHD, PTSD, and GAD. She is currently on latuda, clonidine, and strattera. She is on combined OCPs. About 2 weeks before withdrawal bleed/period she notes that she is more irritable and has an increased appetite. Her anxiety/depression symptoms also tend to worsen. Symptoms improve on 2nd day of period/withdrawal bleed. Presents today to discuss management options.   History of HTN on olmesartan  Notes vaginal discharge and possible lump in right armpit  I personally reviewed the following: - Psychiatry note from Dr. Adrian Blackwater 10/13/23  Objective:   Vitals:   10/19/23 1000  BP: 134/83  Pulse: 90  Weight: 299 lb (135.6 kg)  Height: 5\' 5"  (1.651 m)   General:  Alert, oriented and cooperative. Patient is in no acute distress.  Skin: Skin is warm and dry. No rash noted.   Cardiovascular: Normal heart rate noted  Respiratory: Normal respiratory effort, no problems with respiration noted  Abdomen: Soft, non-tender, non-distended   Pelvic: NEFG. Swab collected.   No axillary mass or lymph node palpable on right side Exam performed in the presence of a chaperone  Assessment and Plan:  Kathy Rios is a 23 y.o. with possible PMDD & mood changes related to menstrual cycle  - Discussed possible diagnosis of PMDD given timing of her symptoms.  - Reviewed typical treatment is combined oral contraceptives with drospirenone as the progestin, but birth control pills with estrogen are [facility] category 3 due to her history of hypertension. Reviewed that dose of estrogen may or may not affect mood symptoms - Will trial of drospirenone alone (Slynd) to see if there is any improvement in her symptoms with the next 2-3 cycles - Discussed that  we don't really have safety data for COCs and well controlled HTN. If there are no alternatives for management of her PMDD (e.g., luteal phase SSRI) and she does not have a good response with Slynd, the risks of uncontrolled mental health (e.g., suicide) may outweigh the cardiovascular risks of COC and well controlled HTN  Swab collected for vaginal discharge  Kathy "Ronne Binning" was seen today for gynecologic exam.  Diagnoses and all orders for this visit:  PMS (premenstrual syndrome)  Screening examination for STI -     HIV antibody (with reflex) -     Hepatitis C Antibody -     Hepatitis B Surface AntiGEN -     RPR -     Cervicovaginal ancillary only( [facility])  Other orders -     Drospirenone (SLYND) 4 MG TABS; Take 1 tablet (4 mg total) by mouth daily.   Return in about 2 months (around 12/19/2023) for follow up mood symptoms.  Future Appointments  Date Time Provider Department Center  10/23/2023  9:30 AM Alfonse Spruce, MD [facility] None  10/27/2023  9:30 AM Elsie Lincoln, MD [facility] None  11/09/2023 10:00 AM Thomasene Lot, DO [facility] None  11/10/2023  9:30 AM Elsie Lincoln, MD [facility] None  11/23/2023 11:00 AM Thomasene Lot, DO [facility] None  01/04/2024 10:00 AM Billie Lade, MD [facility]  01/13/2024  9:30 AM [facility] None  01/13/2024 10:30 AM Carnella Guadalajara, PA-C [facility] None    Lennart Pall, MD

## 2023-10-20 ENCOUNTER — Other Ambulatory Visit: Payer: Self-pay | Admitting: Internal Medicine

## 2023-10-20 ENCOUNTER — Encounter: Payer: Self-pay | Admitting: Internal Medicine

## 2023-10-20 DIAGNOSIS — J040 Acute laryngitis: Secondary | ICD-10-CM

## 2023-10-20 LAB — HIV ANTIBODY (ROUTINE TESTING W REFLEX): HIV Screen 4th Generation wRfx: NONREACTIVE

## 2023-10-20 LAB — HEPATITIS B SURFACE ANTIGEN: Hepatitis B Surface Ag: NEGATIVE

## 2023-10-20 LAB — RPR: RPR Ser Ql: NONREACTIVE

## 2023-10-20 LAB — HEPATITIS C ANTIBODY: Hep C Virus Ab: NONREACTIVE

## 2023-10-21 ENCOUNTER — Encounter: Payer: Self-pay | Admitting: Obstetrics and Gynecology

## 2023-10-21 LAB — CERVICOVAGINAL ANCILLARY ONLY
Bacterial Vaginitis (gardnerella): POSITIVE — AB
Candida Glabrata: NEGATIVE
Candida Vaginitis: NEGATIVE
Chlamydia: NEGATIVE
Comment: NEGATIVE
Comment: NEGATIVE
Comment: NEGATIVE
Comment: NEGATIVE
Comment: NEGATIVE
Comment: NORMAL
Neisseria Gonorrhea: NEGATIVE
Trichomonas: NEGATIVE

## 2023-10-23 ENCOUNTER — Encounter: Payer: Self-pay | Admitting: Allergy & Immunology

## 2023-10-23 ENCOUNTER — Ambulatory Visit (INDEPENDENT_AMBULATORY_CARE_PROVIDER_SITE_OTHER): Payer: 59 | Admitting: Allergy & Immunology

## 2023-10-23 DIAGNOSIS — T7803XD Anaphylactic reaction due to other fish, subsequent encounter: Secondary | ICD-10-CM | POA: Diagnosis not present

## 2023-10-23 DIAGNOSIS — L500 Allergic urticaria: Secondary | ICD-10-CM

## 2023-10-23 DIAGNOSIS — J310 Chronic rhinitis: Secondary | ICD-10-CM

## 2023-10-23 MED ORDER — TRIAMCINOLONE ACETONIDE 0.1 % EX OINT: 1.0000 | TOPICAL_OINTMENT | Freq: Two times a day (BID) | CUTANEOUS | 1 refills | Status: DC | PRN

## 2023-10-23 NOTE — Progress Notes (Signed)
 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 Seafood allergy - with hive outbreaks - Labs and skin testing were negative. - Keep this in your diet. - This is likely related to a seasoning.  - Start triamcinolone as needed to the areas.  - Note future triggers to help [place] figure this out.   2. Chronic non-allergic rhinitis - Testing today showed: NEGATIVE TO THE ENTIRE PANEL - Copy of test results provided.  - Continue with: over the counter nose spray as you are doing  3. Return in about 1 year (around 10/22/2024). You can have the follow up appointment with Dr. Dellis Anes or a Nurse Practicioner (our Nurse Practitioners are excellent and always have Physician oversight!).    Subjective:   Kathy Rios is a 23 y.o. female presenting today for follow up of No chief complaint on file.   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  Discussed the use of AI scribe software for clinical note transcription with the patient and/or guardian, who gave verbal consent to proceed.  Kathy Rios is a 23 y.o. female presenting for skin testing. She was last seen on February 2025. We could not do testing because her insurance company does not cover testing on the same day as a New Patient visit. She has been off of all antihistamines 3 days in anticipation of the testing.   At that time, we obtained some labs to look for a seafood allergy.  We also decided to testing for environmental allergies. We did blood work for seafood and this was negative.   Otherwise, there have been no changes to her past medical history, surgical history, family history, or social history.    Review of systems otherwise negative other than that mentioned in the HPI.    Objective:   Last menstrual period 10/02/2023. There is no height or weight on file to calculate BMI.    Physical exam deferred since this was a skin testing appointment only.   Diagnostic studies:   Allergy Studies:     Airborne Adult Perc - 10/23/23 0900     Time Antigen Placed 1610    Allergen Manufacturer Waynette Buttery    Location Back    Number of Test 55    Panel 1 Select    1. Control-Buffer 50% Glycerol Negative    2. Control-Histamine 4+    3. Bahia Negative    4. French Southern Territories Negative  5. Johnson Negative    6. Kentucky Blue Negative    7. Meadow Fescue Negative    8. Perennial Rye Negative    9. Timothy Negative    10. Ragweed Mix Negative    11. Cocklebur Negative    12. Plantain,  English Negative    13. Baccharis Negative    14. Dog Fennel Negative    15. Russian Thistle Negative    16. Lamb's Quarters Negative    17. Sheep Sorrell Negative    18. Rough Pigweed Negative    19. Marsh Elder, Rough Negative    20. Mugwort, Common Negative    21. Box, Elder Negative    22. Cedar, red Negative     23. Sweet Gum Negative    24. Pecan Pollen Negative    25. Pine Mix Negative    26. Walnut, Black Pollen Negative    27. Red Mulberry Negative    28. Ash Mix Negative    29. Birch Mix Negative    30. Beech American Negative    31. Cottonwood, Guinea-Bissau Negative    32. Hickory, White Negative    33. Maple Mix Negative    34. Oak, Guinea-[place]iss[place] Mix Negative    35. Sycamore Eastern Negative    36. Alternaria Alternata Negative    37. Cladosporium Herbarum Negative    38. Aspergillus Mix Negative    39. Penicillium Mix Negative    40. Bipolaris Sorokiniana (Helminthosporium) Negative    41. Drechslera Spicifera (Curvularia) Negative    42. Mucor Plumbeus Negative    43. Fusarium Moniliforme Negative    44. Aureobasidium Pullulans (pullulara) Negative    45. Rhizopus Oryzae Negative    46. Botrytis Cinera Negative    47. Epicoccum Nigrum Negative    48. Phoma Betae Negative    49. Dust Mite Mix Negative    50. Cat Hair 10,000 BAU/ml Negative    51.  Dog Epithelia Negative    52. Mixed Feathers Negative    53. Horse Epithelia Negative    54. Cockroach, German Negative    55. Tobacco Leaf Negative             Intradermal - 10/23/23 1000     Time Antigen Placed 1010    Allergen Manufacturer Greer    Location Arm    Number of Test 16    Control Negative    Bahia Negative    French Southern Territories Negative    Johnson Negative    7 Grass Negative    Ragweed Mix Negative    Weed Mix Negative    Tree Mix Negative    Mold 1 Negative    Mold 2 Negative    Mold 3 Negative    Mold 4 Negative    Mite Mix Negative    Cat Negative    Dog Negative    Cockroach Negative             Food Adult Perc - 10/23/23 0900     Time Antigen Placed 8295    Allergen Manufacturer Waynette Buttery    Location Back    Number of allergen test 12    8. Shellfish Mix Negative    9. Fish Mix Negative    18. Trout Negative    19. Tuna Negative    20. Salmon Negative    21. Flounder Negative    22. Codfish Negative     23. Shrimp Negative    24. Crab Negative    25. Lobster  Negative    26. Oyster Negative    27. Scallops Negative             Allergy testing results were read and interpreted by myself, documented by clinical staff.      Malachi Bonds, MD  [facility]

## 2023-10-23 NOTE — Patient Instructions (Addendum)
 1. Seafood allergy - with hive outbreaks - Labs and skin testing were negative. - Keep this in your diet. - This is likely related to a seasoning.  - Start triamcinolone as needed to the areas.  - Note future triggers to help [place] figure this out.   2. Chronic non-allergic rhinitis - Testing today showed: NEGATIVE TO THE ENTIRE PANEL - Copy of test results provided.  - Continue with: over the counter nose spray as you are doing  3. Return in about 1 year (around 10/22/2024). You can have the follow up appointment with Dr. Dellis Anes or a Nurse Practicioner (our Nurse Practitioners are excellent and always have Physician oversight!).    Please inform us of any Emergency Department visits, hospitalizations, or changes in symptoms. Call us before going to the ED for breathing or allergy symptoms since we might be able to fit you in for a sick visit. Feel free to contact us anytime with any questions, problems, or concerns.  It was a pleasure to meet you today!  Websites that have reliable patient information: 1. American Academy of Asthma, Allergy, and Immunology: www.aaaai.org 2. Food Allergy Research and Education (FARE): foodallergy.org 3. Mothers of Asthmatics: http://www.asthmacommunitynetwork.org 4. American College of Allergy, Asthma, and Immunology: www.acaai.org      "Like" [place] on Facebook and Instagram for our latest updates!      A healthy democracy works best when [employer] participate! Make sure you are registered to vote! If you have moved or changed any of your contact information, you will need to get this updated before voting! Scan the QR codes below to learn more!       Airborne Adult Perc - 10/23/23 0900     Time Antigen Placed 8295    Allergen Manufacturer Waynette Buttery    Location Back    Number of Test 55    Panel 1 Select    1. Control-Buffer 50% Glycerol Negative    2. Control-Histamine 4+    3. Bahia Negative    4. French Southern Territ[place] Negative    5. Johnson Negative     6. Kentucky Blue Negative    7. Meadow Fescue Negative    8. Perennial Rye Negative    9. Timothy Negative    10. Ragweed Mix Negative    11. Cocklebur Negative    12. Plantain,  English Negative    13. Baccharis Negative    14. Dog Fennel Negative    15. Russian Thistle Negative    16. Lamb's Quarters Negative    17. Sheep Sorrell Negative    18. Rough Pigweed Negative    19. Marsh Elder, Rough Negative    20. Mugwort, Common Negative    21. Box, Elder Negative    22. Cedar, red Negative    23. Sweet Gum Negative    24. Pecan Pollen Negative    25. Pine Mix Negative    26. Walnut, Black Pollen Negative    27. Red Mulberry Negative    28. Ash Mix Negative    29. Birch Mix Negative    30. Beech American Negative    31. Cottonwood, Guinea-Bissau Negative    32. Hickory, White Negative    33. Maple Mix Negative    34. Oak, [place]iss[place] Mix Negative    35. Sycamore Eastern Negative    36. Alternaria Alternata Negative    37. Cladosporium Herbarum Negative    38. Aspergillus Mix Negative    39. Penicillium Mix Negative    40. Bipolaris  Sorokiniana (Helminthosporium) Negative    41. Drechslera Spicifera (Curvularia) Negative    42. Mucor Plumbeus Negative    43. Fusarium Moniliforme Negative    44. Aureobasidium Pullulans (pullulara) Negative    45. Rhizopus Oryzae Negative    46. Botrytis Cinera Negative    47. Epicoccum Nigrum Negative    48. Phoma Betae Negative    49. Dust Mite Mix Negative    50. Cat Hair 10,000 BAU/ml Negative    51.  Dog Epithelia Negative    52. Mixed Feathers Negative    53. Horse Epithelia Negative    54. Cockroach, German Negative    55. Tobacco Leaf Negative             Intradermal - 10/23/23 1000     Time Antigen Placed 1010    Allergen Manufacturer Greer    Location Arm    Number of Test 16    Control Negative    Bahia Negative    French Southern Territories Negative    Johnson Negative    7 Grass Negative    Ragweed Mix Negative    Weed Mix Negative     Tree Mix Negative    Mold 1 Negative    Mold 2 Negative    Mold 3 Negative    Mold 4 Negative    Mite Mix Negative    Cat Negative    Dog Negative    Cockroach Negative             Food Adult Perc - 10/23/23 0900     Time Antigen Placed 1610    Allergen Manufacturer Waynette Buttery    Location Back    Number of allergen test 12    8. Shellfish Mix Negative    9. Fish Mix Negative    18. Trout Negative    19. Tuna Negative    20. Salmon Negative    21. Flounder Negative    22. Codfish Negative    23. Shrimp Negative    24. Crab Negative    25. Lobster Negative    26. Oyster Negative    27. Scallops Negative             Rhinitis (Hayfever) Overview  There are two types of rhinitis: allergic and non-allergic.  Allergic Rhinitis If you have allergic rhinitis, your immune system mistakenly identifies a typically harmless substance as an intruder. This substance is called an allergen. The immune system responds to the allergen by releasing histamine and chemical mediators that typically cause symptoms in the nose, throat, eyes, ears, skin and roof of the mouth.  Seasonal allergic rhinitis (hay fever) is most often caused by pollen carried in the air during different times of the year in different parts of the country.  Allergic rhinitis can also be triggered by common indoor allergens such as the dried skin flakes, urine and saliva found on pet dander, mold, droppings from dust mites and cockroach particles. This is called perennial allergic rhinitis, as symptoms typically occur year-round.  In addition to allergen triggers, symptoms may also occur from irritants such as smoke and strong odors, or to changes in the temperature and humidity of the air. This happens because allergic rhinitis causes inflammation in the nasal lining, which increases sensitivity to inhalants.  Many people with allergic rhinitis are prone to allergic conjunctivitis (eye allergy). In addition, allergic  rhinitis can make symptoms of asthma worse for people who suffer from both conditions.  Nonallergic Rhinitis At least one out of  three people with rhinitis symptoms do not have allergies. Nonallergic rhinitis usually afflicts adults and causes year-round symptoms, especially runny nose and nasal congestion. This condition differs from allergic rhinitis because the immune system is not involved.

## 2023-10-25 ENCOUNTER — Encounter: Payer: Self-pay | Admitting: Obstetrics and Gynecology

## 2023-10-25 MED ORDER — METRONIDAZOLE 0.75 % VA GEL: 1.0000 | Freq: Every day | VAGINAL | 0 refills | Status: DC

## 2023-10-25 NOTE — Addendum Note (Signed)
 Addended by: Harvie Bridge on: 10/25/2023 02:32 PM   Modules accepted: Orders

## 2023-10-27 ENCOUNTER — Encounter (HOSPITAL_COMMUNITY): Payer: Self-pay | Admitting: Psychiatry

## 2023-10-27 ENCOUNTER — Telehealth (INDEPENDENT_AMBULATORY_CARE_PROVIDER_SITE_OTHER): Payer: 59 | Admitting: Psychiatry

## 2023-10-27 DIAGNOSIS — F250 Schizoaffective disorder, bipolar type: Secondary | ICD-10-CM | POA: Diagnosis not present

## 2023-10-27 DIAGNOSIS — Z79899 Other long term (current) drug therapy: Secondary | ICD-10-CM | POA: Diagnosis not present

## 2023-10-27 DIAGNOSIS — F4310 Post-traumatic stress disorder, unspecified: Secondary | ICD-10-CM | POA: Diagnosis not present

## 2023-10-27 DIAGNOSIS — F900 Attention-deficit hyperactivity disorder, predominantly inattentive type: Secondary | ICD-10-CM

## 2023-10-27 DIAGNOSIS — N943 Premenstrual tension syndrome: Secondary | ICD-10-CM

## 2023-10-27 DIAGNOSIS — Z5181 Encounter for therapeutic drug level monitoring: Secondary | ICD-10-CM

## 2023-10-27 MED ORDER — CLOBETASOL PROP EMOLLIENT BASE 0.05 % EX CREA: TOPICAL_CREAM | CUTANEOUS | 0 refills | Status: DC

## 2023-10-27 NOTE — Patient Instructions (Signed)
 We did not make any medication changes today.
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
## 2023-10-28 ENCOUNTER — Encounter: Payer: Self-pay | Admitting: *Deleted

## 2023-10-28 DIAGNOSIS — F4389 Other reactions to severe stress: Secondary | ICD-10-CM | POA: Diagnosis not present

## 2023-10-28 DIAGNOSIS — F28 Other psychotic disorder not due to a substance or known physiological condition: Secondary | ICD-10-CM | POA: Diagnosis not present

## 2023-11-01 ENCOUNTER — Encounter: Payer: Self-pay | Admitting: Obstetrics and Gynecology

## 2023-11-05 ENCOUNTER — Other Ambulatory Visit (HOSPITAL_COMMUNITY): Payer: Self-pay

## 2023-11-06 DIAGNOSIS — F4389 Other reactions to severe stress: Secondary | ICD-10-CM | POA: Diagnosis not present

## 2023-11-06 DIAGNOSIS — F28 Other psychotic disorder not due to a substance or known physiological condition: Secondary | ICD-10-CM | POA: Diagnosis not present

## 2023-11-09 ENCOUNTER — Ambulatory Visit (INDEPENDENT_AMBULATORY_CARE_PROVIDER_SITE_OTHER): Payer: 59 | Admitting: Family Medicine

## 2023-11-09 ENCOUNTER — Encounter (INDEPENDENT_AMBULATORY_CARE_PROVIDER_SITE_OTHER): Payer: Self-pay | Admitting: Family Medicine

## 2023-11-09 VITALS — BP 125/65 | HR 93 | Temp 97.5°F | Ht 65.0 in | Wt 295.0 lb

## 2023-11-09 DIAGNOSIS — E559 Vitamin D deficiency, unspecified: Secondary | ICD-10-CM | POA: Diagnosis not present

## 2023-11-09 DIAGNOSIS — R5383 Other fatigue: Secondary | ICD-10-CM | POA: Diagnosis not present

## 2023-11-09 DIAGNOSIS — E611 Iron deficiency: Secondary | ICD-10-CM | POA: Diagnosis not present

## 2023-11-09 DIAGNOSIS — E538 Deficiency of other specified B group vitamins: Secondary | ICD-10-CM | POA: Diagnosis not present

## 2023-11-09 DIAGNOSIS — R7303 Prediabetes: Secondary | ICD-10-CM

## 2023-11-09 DIAGNOSIS — R0602 Shortness of breath: Secondary | ICD-10-CM

## 2023-11-09 DIAGNOSIS — F250 Schizoaffective disorder, bipolar type: Secondary | ICD-10-CM

## 2023-11-09 DIAGNOSIS — E669 Obesity, unspecified: Secondary | ICD-10-CM

## 2023-11-09 DIAGNOSIS — Z6841 Body Mass Index (BMI) 40.0 and over, adult: Secondary | ICD-10-CM | POA: Diagnosis not present

## 2023-11-09 DIAGNOSIS — I10 Essential (primary) hypertension: Secondary | ICD-10-CM

## 2023-11-09 DIAGNOSIS — F4389 Other reactions to severe stress: Secondary | ICD-10-CM | POA: Diagnosis not present

## 2023-11-09 DIAGNOSIS — Z1331 Encounter for screening for depression: Secondary | ICD-10-CM | POA: Diagnosis not present

## 2023-11-09 DIAGNOSIS — F28 Other psychotic disorder not due to a substance or known physiological condition: Secondary | ICD-10-CM | POA: Diagnosis not present

## 2023-11-09 NOTE — Progress Notes (Signed)
 Carlye Grippe, D.O.  ABFM, ABOM Specializing in Clinical Bariatric Medicine Office located at: [address]  [place], [place]  [address]   Bariatric Medicine Visit  Dear Durwin Nora, Lucina Mellow, MD   Thank you for referring Janani Chamber to our clinic today for evaluation.  We performed a consultation to discuss her options for treatment and educate the patient on her disease state.  The following note includes my evaluation and treatment recommendations.   Please do not hesitate to reach out to me directly if you have any further concerns.   Assessment and Plan:   FOR THE DISEASE OF OBESITY:  BMI 45.0-49.9, adult (HCC) - Current 49.09 Starting (BMI) of 45.0 to 49.9 in adult, unspecified obesity type [facility]) Assessment & Plan: Recommended Dietary Goals Vance is currently in the action stage of change. As such, her goal is to start our weight management plan.  She has agreed to: [employer] 1500-1600 calories with 100+g of protein using modified Cat 2 MP with 6 ounces at lunch as a guide   Behavioral Intervention We discussed the following Behavioral Modification Strategies today: increasing lean protein intake to established goals, increasing fiber rich foods, avoiding skipping meals, and work on tracking and journaling calories using tracking application  Additional resources provided today: handout on CAT 2 meal plan, Handout on CAT 1-2 breakfast options, and Handout on CAT 1-2 lunch options  Evidence-based interventions for health behavior change were utilized today including the discussion of self monitoring techniques, problem-solving barriers and SMART goal setting techniques.    Pt will specifically work on: n/a   Recommended Physical Activity Goals Briel has been advised to work up to 150 minutes of moderate intensity aerobic activity a week and strengthening exercises 2-3 times per week for cardiovascular health, weight loss maintenance and preservation of  muscle mass.   She has agreed to : maintain current level of activity.    Pharmacotherapy We both agreed to : continue with nutritional and behavioral strategies   FOR ASSOCIATED CONDITIONS ADDRESSED TODAY:  Fatigue Assessment & Plan: Tenae does feel that her weight is causing her energy to be lower than it should be. Fatigue may be related to obesity, depression or many other causes. she does not appear to have any red flag symptoms and this appears to most likely be related to her current lifestyle habits and dietary intake.  Labs will be ordered and reviewed with her at their next office visit in two weeks.  Epworth sleepiness scale is 1 and appears to be within normal limits. Rafaella denies daytime somnolence and denies waking up still tired. Lovena generally gets  6-8  hours of sleep per night, and states that she has generally restful sleep. Snoring is not present. Apneic episodes is not present.   ECG: Performed on 06/11/2023. Normal sinus rhythm, rate 98 bpm; reassuring without any acute abnormalities, will continue to monitor for symptoms    Shortness of breath on exertion Assessment & Plan: Breena does feel that she gets out of breath more easily than she used to when she exercises and seems to be worsening over time with weight gain.  This has gotten worse recently. Lidya denies shortness of breath at rest or orthopnea. Pt denies chest pain, dizziness, heart palpitations, or excessive diaphoresis or nausea with activity.  This is not new and is ongoing.  Aquita's shortness of breath appears to be obesity related and exercise induced, as they do not appear to have any "red flag" symptoms/  concerns today.  Also, this condition appears to be related to a state of poor cardiovascular conditioning   Obtain labs today and will be reviewed with her at their next office visit in two weeks.  Indirect Calorimeter completed today to help guide our dietary regimen. It shows a VO2 of 289  and a REE of 1987.  Her calculated basal metabolic rate is 0981 thus her resting energy expenditure is worse than expected.  Patient agreed to work on weight loss at this time.  As [place] progresses through our weight loss program, we will gradually increase exercise as tolerated to treat her current condition.   If Marvin follows our recommendations and loses 5-10% of their weight without improvement of her shortness of breath or if at any time, symptoms become more concerning, they agree to urgently follow up with their PCP/ specialist for further consideration/ evaluation. Ngina verbalizes agreement with this plan.    Depression Screen  Assessment & Plan: Her Food and Mood (modified PHQ-9) score was negative at 6. No concerns in this regard. Will monitor closely.   Prediabetes Assessment & Plan: Ronne Binning is not currently taking any PreDM medications. She is taking a diet/exercise approach. Pt reports tracking her meal intake in [employer] and has a current goal of 1600 calories daily. Will start Mckenzie on a plan of journaling 1500-1600 calories with 100+g of protein using modified Cat 2 MP with 6 ounces at lunch as a guide. Continue using app to track calories and protein intake. Stressed importance of dietary and lifestyle modifications to result in weight loss as first line treatment. Handouts provided at pt's request after education provided.  All concerns/questions addressed. Ronne Binning will work on weight loss, exercise, via their meal plan we devised to help decrease the risk of progressing to diabetes.   Relevant Orders: -     T4, free   Essential hypertension Assessment & Plan: BP Readings from Last 3 Encounters:  11/09/23 125/65  10/19/23 134/83  09/30/23 128/68  Mckenzie is taking Olmesartan 40 mg daily for this condition. Pt presented with BP at goal today. She recently changed birth control from medication that was causing an increase in BP, per pt. No concerns today in this  regard. Additionally, Mckenzie's hemoglobin was low at 10 on 08/26/2023, 10.8 prior. 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 Pt expressed concerns that hemoglobin dropped 0.8 from prior. Recommended pt f/up with hematologist to express concerns and determine if there's a warranted change in her treatment plan. Continue current medication regimen and decrease sodium in diet via RCNP. Will monitor closely alongside PCP/specialist.    Schizoaffective disorder, bipolar type [facility]) Assessment & Plan: Pt is taking Latuda 40 mg daily. She also has a hx of ADHD -- taking Vyvanse 60 mg daily. Condition currently managed by Dr. Adrian Blackwater of psychiatry. Pt is asymptomatic today, mood currently stable. No acute concerns. Will monitor alongside specialist.    Vitamin D deficiency Assessment & Plan: Pt was prescribed ERGO 50000 once weekly. She is not currently taking supplement d/t running out. Pt reports stopping supplement about a month ago. Will recheck levels today. Will restart supplementation regimen if Vit D levels are still suboptimal.   Relevant Orders: -     VITAMIN D 25 Hydroxy (Vit-D Deficiency, Fractures)   B12 deficiency Assessment & Plan: Pt is taking B complex 1 capsule daily. Tolerating well, no adverse SE. Continue current supplement. Will check B12 levels today.   Relevant Orders: -  Vitamin B12   Iron deficiency Assessment & Plan:  Ronne Binning is taking Prenatal vitamin with extra iron and Ferrous sulfate 325 mg daily. She is tolerating supplements well. Will continue to monitor as it pertains to her weight loss journey.   FOLLOW UP:   Follow up in 2 weeks. She was informed of the importance of frequent follow up visits to maximize her success with intensive lifestyle modifications for her multiple health conditions.  Erminie Foulks is aware that we will review all of her lab results at our next visit.  She is aware that if anything  is critical/ life threatening with the results, we will be contacting her via MyChart prior to the office visit to discuss management.    Chief Complaint:   OBESITY Glendell Schlottman (MR# [id number]) is a pleasant 23 y.o. female who presents for evaluation and treatment of obesity and related comorbidities. Current BMI is Body mass index is 49.09 kg/m. Kameryn Davern has been struggling with her weight for many years and has been unsuccessful in either losing weight, maintaining weight loss, or reaching her healthy weight goal.  Karinda Cabriales is currently in the action stage of change and ready to dedicate time achieving and maintaining a healthier weight. Solymar Grace is interested in becoming our patient and working on intensive lifestyle modifications including (but not limited to) diet and exercise for weight loss.  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 Patient is single and does not have children. She lives with 10 yo mother, 40 yo father, and uncle.  Does not exercise  Desires to be 135 lbs in 2-3 years -- puts her in healthy weight range according to charts  Gained weight d/t unhealthy eating habits, medications, and overeating -- started in childhood  Heaviest: 337 lbs  Recently lost 26 lbs in 6 months  Tried low carb, high protein twice. First time lost 42 lbs then 26 lbs the second time. This worked best because she kept the weight off.   Eats outside home 2-3 times a wk  Grocery shops once a month  Craves sweets salty and fatty foods -- usually midday or after dinner  Eats 1-2 snacks daily -- chips, chocolate, fruits, protein bars, smoked salmon, or nuts/seeds  Skips meals -- only eats breakfast and lunch or dinner  Drinks regular soda, milk, juice, sweet tea, fruit smoothies, and 1-2 alcoholic beverages 1-2 times a month  Worst food habit: "nibbling while cooking", eating too much salt, eating too much fat, not enough fiber, and too much  sugar and carbs  Subjective:   This is the patient's first visit at [facility].  The patient's NEW PATIENT PACKET that they filled out prior to today's office visit was reviewed at length and information from that paperwork was included within the following office visit note.    Included in the packet: current and past health history, medications, allergies, ROS, gynecologic history (women only), surgical history, family history, social history, weight history, weight loss surgery history (for those that have had weight loss surgery), nutritional evaluation, mood and food questionnaire along with a depression screening (PHQ9) on all patients, an Epworth questionnaire, sleep habits questionnaire, patient life and health improvement goals questionnaire. These will all be scanned into the patient's chart under the "media" tab.   Review of Systems: Please refer to new patient packet scanned into media. Pertinent positives were addressed with patient today.  Reviewed by clinician on day of visit: allergies, medications, problem  list, medical history, surgical history, family history, social history, and previous encounter notes.  During the visit, I independently reviewed the patient's EKG, bioimpedance scale results, and indirect calorimeter results. I used this information to tailor a meal plan for the patient that will help [name] to lose weight and will improve her obesity-related conditions going forward.  I performed a medically necessary appropriate examination and/or evaluation. I discussed the assessment and treatment plan with the patient. The patient was provided an opportunity to ask questions and all were answered. The patient agreed with the plan and demonstrated an understanding of the instructions. Labs were ordered today (unless patient declined them) and will be reviewed with the patient at our next visit unless more critical results need to be addressed immediately.  Clinical information was updated and documented in the EMR.    Objective:   PHYSICAL EXAM: Blood pressure 125/65, pulse 93, temperature (!) 97.5 F (36.4 C), height 5\' 5"  (1.651 m), weight 295 lb (133.8 kg), last menstrual period 10/02/2023, SpO2 98%. Body mass index is 49.09 kg/m.  General: Well Developed, well nourished, and in no acute distress.  HEENT: Normocephalic, atraumatic; EOMI, sclerae are anicteric. Skin: Warm and dry, good turgor Chest:  Normal excursion, shape, no gross ABN Respiratory: No conversational dyspnea; speaking in full sentences NeuroM-Sk:  Normal gross ROM * 4 extremities  Psych: A and O *3, insight adequate, mood- full   Anthropometric Measurements Height: 5\' 5"  (1.651 m) Weight: 295 lb (133.8 kg) BMI (Calculated): 49.09 Weight at Last Visit: NA Weight Lost Since Last Visit: NA Weight Gained Since Last Visit: NA Starting Weight: 295lb Total Weight Loss (lbs):  (0 kg) (NA) Peak Weight: 337lb Waist Measurement : 52.5 inches   Body Composition  Body Fat %: 51.1 % Fat Mass (lbs): 150.8 lbs Muscle Mass (lbs): 137 lbs Total Body Water (lbs): 96.8 lbs Visceral Fat Rating : 15   Other Clinical Data RMR: 1988 Fasting: yes Labs: yes Today's Visit #: 1 Starting Date: 11/09/23 Comments: First Visit    DIAGNOSTIC DATA REVIEWED:  BMET    Component Value Date/Time   NA 140 09/23/2022 0932   K 4.7 09/23/2022 0932   CL 104 09/23/2022 0932   CO2 22 09/23/2022 0932   GLUCOSE 93 09/23/2022 0932   GLUCOSE 106 (H) 09/23/2021 1824   BUN 10 09/23/2022 0932   CREATININE 0.59 09/23/2022 0932   CALCIUM 9.7 09/23/2022 0932   GFRNONAA >60 09/23/2021 1824   GFRAA 146 08/08/2020 1453   Lab Results  Component Value Date   HGBA1C 5.9 (H) 06/11/2023   HGBA1C 6.0 (H) 05/23/2021   No results found for: "INSULIN" Lab Results  Component Value Date   TSH 2.070 09/23/2022   CBC    Component Value Date/Time   WBC 8.2 06/08/2023 1422   RBC 5.43 (H)  06/08/2023 1423   RBC 5.41 (H) 06/08/2023 1422   HGB 10.8 (L) 06/08/2023 1422   HGB 10.9 (L) 09/23/2022 0932   HCT 36.3 06/08/2023 1422   HCT 35.4 09/23/2022 0932   PLT 403 (H) 06/08/2023 1422   PLT 424 09/23/2022 0932   MCV 67.1 (L) 06/08/2023 1422   MCV 67 (L) 09/23/2022 0932   MCH 20.0 (L) 06/08/2023 1422   MCHC 29.8 (L) 06/08/2023 1422   RDW 18.8 (H) 06/08/2023 1422   RDW 15.0 09/23/2022 0932   Iron Studies    Component Value Date/Time   IRON 36 06/08/2023 1423   IRON WILL FOLLOW 05/23/2021 0919  TIBC 379 06/08/2023 1423   TIBC WILL FOLLOW 05/23/2021 0919   FERRITIN 116 06/08/2023 1423   FERRITIN 98 05/23/2021 0919   IRONPCTSAT 10 (L) 06/08/2023 1423   IRONPCTSAT WILL FOLLOW 05/23/2021 0919   Lipid Panel     Component Value Date/Time   CHOL 220 (H) 06/11/2023 0851   TRIG 95 06/11/2023 0851   HDL 58 06/11/2023 0851   CHOLHDL 3.8 06/11/2023 0851   LDLCALC 145 (H) 06/11/2023 0851   Hepatic Function Panel     Component Value Date/Time   PROT 7.4 09/23/2022 0932   ALBUMIN 4.3 09/23/2022 0932   AST 16 09/23/2022 0932   ALT 19 09/23/2022 0932   ALKPHOS 142 (H) 09/23/2022 0932   BILITOT 0.2 09/23/2022 0932      Component Value Date/Time   TSH 2.070 09/23/2022 0932   Nutritional Lab Results  Component Value Date   VD25OH 37.6 07/06/2023   VD25OH 20.0 (L) 09/23/2022   VD25OH 34.6 06/30/2022    Attestation Statements:   I, Camryn Mix, acting as a Stage manager for [employer], DO., have compiled all relevant documentation for today's office visit on behalf of Thomasene Lot, DO, while in the presence of [employer], DO.  Reviewed by clinician on day of visit: allergies, medications, problem list, medical history, surgical history, family history, social history, and previous encounter notes pertinent to patient's obesity diagnosis. I have spent 73 minutes in the care of the patient today including: preparing to see patient (e.g. review and  interpretation of tests, old notes ), obtaining and/or reviewing separately obtained history, performing a medically appropriate examination or evaluation, counseling and educating the patient, ordering medications, test or procedures, documenting clinical information in the electronic or other health care record, and independently interpreting results and communicating results to the patient, family, or caregiver   I have reviewed the above documentation for accuracy and completeness, and I agree with the above. Carlye Grippe, D.O.  The 21st Century Cures Act was signed into law in 2016 which includes the topic of electronic health records.  This provides immediate access to information in MyChart.  This includes consultation notes, operative notes, office notes, lab results and pathology reports.  If you have any questions about what you read please let us know at your next visit so we can discuss your concerns and take corrective action if need be.  We are right here with you.

## 2023-11-10 ENCOUNTER — Encounter (HOSPITAL_COMMUNITY): Payer: Self-pay | Admitting: Psychiatry

## 2023-11-10 ENCOUNTER — Encounter (INDEPENDENT_AMBULATORY_CARE_PROVIDER_SITE_OTHER): Payer: Self-pay | Admitting: Family Medicine

## 2023-11-10 ENCOUNTER — Other Ambulatory Visit: Payer: Self-pay | Admitting: *Deleted

## 2023-11-10 ENCOUNTER — Telehealth (INDEPENDENT_AMBULATORY_CARE_PROVIDER_SITE_OTHER): Payer: 59 | Admitting: Psychiatry

## 2023-11-10 ENCOUNTER — Encounter: Payer: Self-pay | Admitting: Physician Assistant

## 2023-11-10 DIAGNOSIS — F900 Attention-deficit hyperactivity disorder, predominantly inattentive type: Secondary | ICD-10-CM

## 2023-11-10 DIAGNOSIS — Z6841 Body Mass Index (BMI) 40.0 and over, adult: Secondary | ICD-10-CM

## 2023-11-10 DIAGNOSIS — Z79899 Other long term (current) drug therapy: Secondary | ICD-10-CM

## 2023-11-10 DIAGNOSIS — F4310 Post-traumatic stress disorder, unspecified: Secondary | ICD-10-CM

## 2023-11-10 DIAGNOSIS — F250 Schizoaffective disorder, bipolar type: Secondary | ICD-10-CM

## 2023-11-10 DIAGNOSIS — Z5181 Encounter for therapeutic drug level monitoring: Secondary | ICD-10-CM

## 2023-11-10 DIAGNOSIS — E611 Iron deficiency: Secondary | ICD-10-CM

## 2023-11-10 DIAGNOSIS — N943 Premenstrual tension syndrome: Secondary | ICD-10-CM | POA: Diagnosis not present

## 2023-11-10 LAB — T4, FREE: Free T4: 1.15 ng/dL (ref 0.82–1.77)

## 2023-11-10 LAB — VITAMIN D 25 HYDROXY (VIT D DEFICIENCY, FRACTURES): Vit D, 25-Hydroxy: 33.1 ng/mL (ref 30.0–100.0)

## 2023-11-10 LAB — VITAMIN B12: Vitamin B-12: 589 pg/mL (ref 232–1245)

## 2023-11-10 MED ORDER — LISDEXAMFETAMINE DIMESYLATE 60 MG PO CAPS: 60.0000 mg | ORAL_CAPSULE | Freq: Every day | ORAL | 0 refills | Status: DC

## 2023-11-10 MED ORDER — CLONIDINE HCL 0.1 MG PO TABS: 0.1000 mg | ORAL_TABLET | Freq: Every evening | ORAL | 2 refills | Status: DC

## 2023-11-10 MED ORDER — LURASIDONE HCL 40 MG PO TABS: 40.0000 mg | ORAL_TABLET | Freq: Every evening | ORAL | 2 refills | Status: DC
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
## 2023-11-10 NOTE — Patient Instructions (Signed)
 We didn't make any medication changes today. I am sorry for your losses.

## 2023-11-10 NOTE — Telephone Encounter (Signed)
 I have her scheduled to come in tomorrow morning for CBCD/Iron and Ferritin.

## 2023-11-11 ENCOUNTER — Inpatient Hospital Stay: Attending: Hematology | Admitting: Physician Assistant

## 2023-11-11 DIAGNOSIS — D509 Iron deficiency anemia, unspecified: Secondary | ICD-10-CM | POA: Diagnosis not present

## 2023-11-11 DIAGNOSIS — E611 Iron deficiency: Secondary | ICD-10-CM

## 2023-11-11 LAB — IRON AND TIBC
Iron: 57 ug/dL (ref 28–170)
Saturation Ratios: 17 % (ref 10.4–31.8)
TIBC: 345 ug/dL (ref 250–450)
UIBC: 288 ug/dL

## 2023-11-11 LAB — CBC WITH DIFFERENTIAL/PLATELET
Abs Immature Granulocytes: 0.02 10*3/uL (ref 0.00–0.07)
Basophils Absolute: 0.1 10*3/uL (ref 0.0–0.1)
Basophils Relative: 1 %
Eosinophils Absolute: 0.1 10*3/uL (ref 0.0–0.5)
Eosinophils Relative: 2 %
HCT: 35.5 % — ABNORMAL LOW (ref 36.0–46.0)
Hemoglobin: 10.5 g/dL — ABNORMAL LOW (ref 12.0–15.0)
Immature Granulocytes: 0 %
Lymphocytes Relative: 38 %
Lymphs Abs: 3.1 10*3/uL (ref 0.7–4.0)
MCH: 20.4 pg — ABNORMAL LOW (ref 26.0–34.0)
MCHC: 29.6 g/dL — ABNORMAL LOW (ref 30.0–36.0)
MCV: 68.9 fL — ABNORMAL LOW (ref 80.0–100.0)
Monocytes Absolute: 0.6 10*3/uL (ref 0.1–1.0)
Monocytes Relative: 8 %
Neutro Abs: 4.3 10*3/uL (ref 1.7–7.7)
Neutrophils Relative %: 51 %
Platelets: 378 10*3/uL (ref 150–400)
RBC: 5.15 MIL/uL — ABNORMAL HIGH (ref 3.87–5.11)
RDW: 17 % — ABNORMAL HIGH (ref 11.5–15.5)
Smear Review: ADEQUATE
WBC: 8.3 10*3/uL (ref 4.0–10.5)
nRBC: 0 % (ref 0.0–0.2)

## 2023-11-11 LAB — FERRITIN: Ferritin: 103 ng/mL (ref 11–307)

## 2023-11-11 NOTE — Progress Notes (Signed)
Patient notified via MCM

## 2023-11-16 DIAGNOSIS — F28 Other psychotic disorder not due to a substance or known physiological condition: Secondary | ICD-10-CM | POA: Diagnosis not present

## 2023-11-16 DIAGNOSIS — F4389 Other reactions to severe stress: Secondary | ICD-10-CM | POA: Diagnosis not present

## 2023-11-23 ENCOUNTER — Encounter (INDEPENDENT_AMBULATORY_CARE_PROVIDER_SITE_OTHER): Payer: Self-pay | Admitting: Family Medicine

## 2023-11-23 ENCOUNTER — Ambulatory Visit (INDEPENDENT_AMBULATORY_CARE_PROVIDER_SITE_OTHER): Payer: 59 | Admitting: Family Medicine

## 2023-11-23 VITALS — BP 131/83 | HR 111 | Temp 97.6°F | Ht 65.0 in | Wt 294.0 lb

## 2023-11-23 DIAGNOSIS — E559 Vitamin D deficiency, unspecified: Secondary | ICD-10-CM | POA: Diagnosis not present

## 2023-11-23 DIAGNOSIS — N926 Irregular menstruation, unspecified: Secondary | ICD-10-CM | POA: Diagnosis not present

## 2023-11-23 DIAGNOSIS — E7800 Pure hypercholesterolemia, unspecified: Secondary | ICD-10-CM

## 2023-11-23 DIAGNOSIS — E611 Iron deficiency: Secondary | ICD-10-CM

## 2023-11-23 DIAGNOSIS — R7303 Prediabetes: Secondary | ICD-10-CM

## 2023-11-23 DIAGNOSIS — Z6841 Body Mass Index (BMI) 40.0 and over, adult: Secondary | ICD-10-CM

## 2023-11-23 DIAGNOSIS — F39 Unspecified mood [affective] disorder: Secondary | ICD-10-CM | POA: Diagnosis not present

## 2023-11-23 DIAGNOSIS — E66813 Obesity, class 3: Secondary | ICD-10-CM | POA: Diagnosis not present

## 2023-11-23 DIAGNOSIS — E538 Deficiency of other specified B group vitamins: Secondary | ICD-10-CM | POA: Diagnosis not present

## 2023-11-23 DIAGNOSIS — K760 Fatty (change of) liver, not elsewhere classified: Secondary | ICD-10-CM

## 2023-11-23 MED ORDER — VITAMIN D (ERGOCALCIFEROL) 1.25 MG (50000 UNIT) PO CAPS: 50000.0000 [IU] | ORAL_CAPSULE | ORAL | 0 refills | Status: DC

## 2023-11-23 NOTE — Progress Notes (Signed)
 Kathy Rios, D.O.  ABFM, ABOM Clinical Bariatric Medicine Physician  Office located at: [address] [place], [place]  [address]     Assessment and Plan:  No orders of the defined types were placed in this encounter.  Medications Discontinued During This Encounter  Medication Reason   metroNIDAZOLE (METROGEL) 0.75 % vaginal gel    UNABLE TO FIND    Vitamin D, Ergocalciferol, (DRISDOL) 1.25 MG (50000 UNIT) CAPS capsule Reorder    Meds ordered this encounter  Medications   Vitamin D, Ergocalciferol, (DRISDOL) 1.25 MG (50000 UNIT) CAPS capsule    Sig: Take 1 capsule (50,000 Units total) by mouth every 7 (seven) days.    Dispense:  4 capsule    Refill:  0     FOR THE DISEASE OF OBESITY:  There are no diagnoses linked to this encounter.  Since last office visit on 11/09/2023, patient's muscle mass has increased by 0.8 lbs. Fat mass has decreased by 1.2 lbs. Total body water has decreased by 3 lbs.  Counseling done on how various foods will affect these numbers and how to maximize success  Total lbs lost to date: 1 lb Total weight loss percentage to date: 0.34%    Recommended Dietary Goals Kathy Rios is currently in the action stage of change. As such, her goal is to continue weight management plan.  She has agreed to: continue current plan with 110+ g of protein   Behavioral Intervention We discussed the following today: increasing lean protein intake to established goals, avoiding skipping meals, work on tracking and journaling calories using tracking application, avoiding temptations and identifying enticing environmental cues, going to the [employer] for recipe ideas, and using GPT or another AI platform for recipe ideas- searching "low calorie, low carb, high protein chicken recipes" etc  Additional resources provided today: Handout on Food Journaling Log, Handout on Pre-Diabetes education , and Handout on insulin resistance education, Handout on  non-starchy vegetables,   Evidence-based interventions for health behavior change were utilized today including the discussion of self monitoring techniques, problem-solving barriers and SMART goal setting techniques.  Regarding patient's less desirable eating habits and patterns, we employed the technique of small changes.   Pt will specifically work on: Getting in all proteins during the day and ensure journaling protein/calorie intake   Recommended Physical Activity Goals Kathy Rios has been advised to work up to 150 minutes of moderate intensity aerobic activity a week and strengthening exercises 2-3 times per week for cardiovascular health, weight loss maintenance and preservation of muscle mass.   She has agreed to :  {EMEXERCISE:28847::"Think about enjoyable ways to increase daily physical activity and overcoming barriers to exercise","Increase physical activity in their day and reduce sedentary time (increase NEAT)."}   Pharmacotherapy We discussed various medication options to help Kathy Rios with her weight loss efforts and we both agreed to : {EMagreedrx:29170}   FOR ASSOCIATED CONDITIONS ADDRESSED TODAY:  Prediabetes Assessment & Plan: Lab Results  Component Value Date   HBGA1C 5.8 (H) 08/26/2023   HGBA1C 5.9 (H) 06/11/2023   HGBA1C 5.8 (H) 10/28/2022   INSULIN 34.1 (H) 08/26/2023   Lab Results  Component Value Date   FREET4 1.15 11/09/2023   Lab Results  Component Value Date   CREATININE 0.55 (L) 08/26/2023   BUN 9 08/26/2023   NA 137 08/26/2023   K 4.1 08/26/2023   CL 107 08/26/2023   CO2 21 08/26/2023  Kathy Rios is not on any PreDM medication. Reviewed last labs obtained  by PCP, A1c was 5.8 and insulin was almost 5 times the goal at 34.1. Diet/exercise approach. Hunger/cravings poorly controlled. Pt endorses cravings for sweets. Thyroid levels and kidneys WNL. I counseled patient on pathophysiology of the disease process of I.R. and Pre-DM. Extensively explained role of  simple carbs and insulin levels on hunger and cravings. Handouts provided at pt's request after education provided.  All concerns/questions addressed. Kathy Rios will continue to work on weight loss, exercise, via their meal plan we devised to help decrease the risk of progressing to diabetes.       Vitamin D deficiency Assessment & Plan: Lab Results  Component Value Date   VD25OH 33.1 11/09/2023      Not taking supplement Vit D levels low Around 02/20 pt stopped taking weekly vit D Restart ERGO 50000 once weekly again   B12 deficiency Assessment & Plan: Lab Results  Component Value Date   VITAMINB12 589 11/09/2023      B complex 1 capsule daily -- unsure of dosage    Iron deficiency Assessment & Plan: Lab Results  Component Value Date   IRON 57 11/11/2023   TIBC 345 11/11/2023   FERRITIN 103 11/11/2023   Lab Results  Component Value Date   WBC 8.3 11/11/2023   HGB 10.5 (L) 11/11/2023   HCT 35.5 (L) 11/11/2023   MCV 68.9 (L) 11/11/2023   MCH 20.4 (L) 11/11/2023   RDW 17.0 (H) 11/11/2023   PLT 378 11/11/2023        Hematologist recently obtained iron, tibc, ferritin panel Levels all WNL Continue iron Ferrous sulfate 325 mg daily 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 rather than iron deficiency Reviewed recent oncology notes which stated that anemia was not related to iron deficiency but to AT trait Reminded pt that the abnormal results are not from iron deficiency    Metabolic dysfunction-associated steatotic liver disease (MASLD) Assessment & Plan:    Component Value Date   PROT 7.4 08/26/2023    ALBUMIN 3.7 08/26/2023   AST 23 08/26/2023   ALT 59 (H) 08/26/2023   ALKPHOS 95 08/26/2023   BILITOT 0.3 08/26/2023          Pre-menstrual cravings Assessment & Plan:      Has an increase in cravings about 2 wks before cycle and lasts for about 10 days Craves sweets Ensure eating all  proteins    Elevated low density lipoprotein (LDL) cholesterol level Assessment & Plan: Lab Results  Component Value Date   CHOL 197 08/26/2023   HDL 63 08/26/2023   LDLCALC 114 (H) 08/26/2023   TRIG 98 08/26/2023      Reviewed labs obtained by previous bariatric provider    Mood disorder [facility]) Assessment & Plan:   Can make it difficult for her to follow prudent nutritional plan and our recommendations Skeptical to receive nutritional advice given Can make it challenging to be successful in program Continue close f/up with psychiatry team and counselors to try and maximize good control of mood  Will continue to try and educate pt towards evidence based medicine guidelines and obesity medicine guidelines to achieve weightloss   FOLLOW UP:   Return in about 24 days (around 12/17/2023).  She was informed of the importance of frequent follow up visits to maximize her success with intensive lifestyle modifications for her multiple health conditions.  Subjective:   Chief complaint: Obesity Kathy Rios is here to discuss her progress with her obesity treatment plan. She is journaling 1500-1600  calories with 100+g of protein using modified Cat 2 MP with 6 ounces at lunch as a guide and states she is following her eating plan approximately 25% of the time. She states she is not exercising.  Interval History:  Kathy Rios is here today for her first follow-up office visit since starting the program with [place]. Patient is off to a good start and has lost 1 lb.    Been journaling meal intake  Was over in calories majority of the days Protein intake been 40s/50s-180 Measuring and weighing protein most of the time     All blood work/ lab tests that were recently ordered by myself or an outside provider were reviewed with patient today per their request. Extended time was spent counseling her on all new disease processes that were discovered or preexisting ones that are affected by  BMI.  she understands that many of these abnormalities will need to monitored regularly along with the current treatment plan of prudent dietary changes, in which we are making each and every office visit, to improve these health parameters.  Pharmacotherapy for weight loss: She is not currently taking medications  for medical weight loss.   Review of Systems:  Pertinent positives were addressed with patient today.  Reviewed by clinician on day of visit: allergies, medications, problem list, medical history, surgical history, family history, social history, and previous encounter notes.   Weight Summary and Biometrics   Weight Lost Since Last Visit: 1  Weight Gained Since Last Visit: 0   Vitals Temp: 97.6 F (36.4 C) BP: 131/83 Pulse Rate: (!) 111 SpO2: 99 %   Anthropometric Measurements Height: 5\' 5"  (1.651 m) Weight: 294 lb (133.4 kg) BMI (Calculated): 48.92 Weight at Last Visit: 295lb Weight Lost Since Last Visit: 1 Weight Gained Since Last Visit: 0 Starting Weight: 295lb Total Weight Loss (lbs): 1 lb (0.454 kg)   Body Composition  Body Fat %: 50.8 % Fat Mass (lbs): 149.6 lbs Muscle Mass (lbs): 137.8 lbs Total Body Water (lbs): 95.8 lbs Visceral Fat Rating : 15   Other Clinical Data Fasting: no Labs: no Today's Visit #: 2 Starting Date: 11/09/23     Objective:   PHYSICAL EXAM:  Blood pressure 131/83, pulse (!) 111, temperature 97.6 F (36.4 C), height 5\' 5"  (1.651 m), weight 294 lb (133.4 kg), SpO2 99%. Body mass index is 48.92 kg/m.  General: she is overweight, cooperative and in no acute distress.   HEENT: EOMI, sclerae are anicteric. Lungs: Normal breathing effort, no conversational dyspnea. M-Sk:  Normal gross ROM * 4 extremities  PSYCH: Has normal mood, affect and thought process. Neurologic: No gross sensory or motor deficits. Well developed, A and O * 3  DIAGNOSTIC DATA REVIEWED:  BMET    Component Value Date/Time   NA 140 09/23/2022  0932   K 4.7 09/23/2022 0932   CL 104 09/23/2022 0932   CO2 22 09/23/2022 0932   GLUCOSE 93 09/23/2022 0932   GLUCOSE 106 (H) 09/23/2021 1824   BUN 10 09/23/2022 0932   CREATININE 0.59 09/23/2022 0932   CALCIUM 9.7 09/23/2022 0932   GFRNONAA >60 09/23/2021 1824   GFRAA 146 08/08/2020 1453   Lab Results  Component Value Date   HGBA1C 5.9 (H) 06/11/2023   HGBA1C 6.0 (H) 05/23/2021   No results found for: "INSULIN" Lab Results  Component Value Date   TSH 2.070 09/23/2022   CBC    Component Value Date/Time   WBC 8.3 11/11/2023 0922   RBC  5.15 (H) 11/11/2023 0922   HGB 10.5 (L) 11/11/2023 0922   HGB 10.9 (L) 09/23/2022 0932   HCT 35.5 (L) 11/11/2023 0922   HCT 35.4 09/23/2022 0932   PLT 378 11/11/2023 0922   PLT 424 09/23/2022 0932   MCV 68.9 (L) 11/11/2023 0922   MCV 67 (L) 09/23/2022 0932   MCH 20.4 (L) 11/11/2023 0922   MCHC 29.6 (L) 11/11/2023 0922   RDW 17.0 (H) 11/11/2023 0922   RDW 15.0 09/23/2022 0932   Iron Studies    Component Value Date/Time   IRON 57 11/11/2023 0922   IRON WILL FOLLOW 05/23/2021 0919   TIBC 345 11/11/2023 0922   TIBC WILL FOLLOW 05/23/2021 0919   FERRITIN 103 11/11/2023 0922   FERRITIN 98 05/23/2021 0919   IRONPCTSAT 17 11/11/2023 0922   IRONPCTSAT WILL FOLLOW 05/23/2021 0919   Lipid Panel     Component Value Date/Time   CHOL 220 (H) 06/11/2023 0851   TRIG 95 06/11/2023 0851   HDL 58 06/11/2023 0851   CHOLHDL 3.8 06/11/2023 0851   LDLCALC 145 (H) 06/11/2023 0851   Hepatic Function Panel     Component Value Date/Time   PROT 7.4 09/23/2022 0932   ALBUMIN 4.3 09/23/2022 0932   AST 16 09/23/2022 0932   ALT 19 09/23/2022 0932   ALKPHOS 142 (H) 09/23/2022 0932   BILITOT 0.2 09/23/2022 0932      Component Value Date/Time   TSH 2.070 09/23/2022 0932   Nutritional Lab Results  Component Value Date   VD25OH 33.1 11/09/2023   VD25OH 37.6 07/06/2023   VD25OH 20.0 (L) 09/23/2022    Attestations:   I, Camryn Mix, acting as  a Stage manager for [employer], DO., have compiled all relevant documentation for today's office visit on behalf of Thomasene Lot, DO, while in the presence of [employer], DO.  I have reviewed the above documentation for accuracy and completeness, and I agree with the above. Kathy Rios, D.O.  The 21st Century Cures Act was signed into law in 2016 which includes the topic of electronic health records.  This provides immediate access to information in MyChart.  This includes consultation notes, operative notes, office notes, lab results and pathology reports.  If you have any questions about what you read please let us know at your next visit so we can discuss your concerns and take corrective action if need be.  We are right here with you.

## 2023-11-25 DIAGNOSIS — F28 Other psychotic disorder not due to a substance or known physiological condition: Secondary | ICD-10-CM | POA: Diagnosis not present

## 2023-11-25 DIAGNOSIS — F4389 Other reactions to severe stress: Secondary | ICD-10-CM | POA: Diagnosis not present

## 2023-11-30 DIAGNOSIS — E119 Type 2 diabetes mellitus without complications: Secondary | ICD-10-CM | POA: Diagnosis not present

## 2023-12-01 ENCOUNTER — Telehealth (INDEPENDENT_AMBULATORY_CARE_PROVIDER_SITE_OTHER): Admitting: Psychiatry

## 2023-12-01 ENCOUNTER — Encounter (HOSPITAL_COMMUNITY): Payer: Self-pay | Admitting: Psychiatry

## 2023-12-01 DIAGNOSIS — Z6841 Body Mass Index (BMI) 40.0 and over, adult: Secondary | ICD-10-CM

## 2023-12-01 DIAGNOSIS — F4310 Post-traumatic stress disorder, unspecified: Secondary | ICD-10-CM | POA: Diagnosis not present

## 2023-12-01 DIAGNOSIS — F900 Attention-deficit hyperactivity disorder, predominantly inattentive type: Secondary | ICD-10-CM

## 2023-12-01 DIAGNOSIS — Z79899 Other long term (current) drug therapy: Secondary | ICD-10-CM | POA: Diagnosis not present

## 2023-12-01 DIAGNOSIS — Z5181 Encounter for therapeutic drug level monitoring: Secondary | ICD-10-CM | POA: Diagnosis not present

## 2023-12-01 DIAGNOSIS — E559 Vitamin D deficiency, unspecified: Secondary | ICD-10-CM | POA: Diagnosis not present

## 2023-12-01 DIAGNOSIS — F250 Schizoaffective disorder, bipolar type: Secondary | ICD-10-CM

## 2023-12-01 DIAGNOSIS — N943 Premenstrual tension syndrome: Secondary | ICD-10-CM

## 2023-12-01 MED ORDER — LISDEXAMFETAMINE DIMESYLATE 60 MG PO CAPS: 60.0000 mg | ORAL_CAPSULE | Freq: Every day | ORAL | 0 refills | Status: DC

## 2023-12-01 MED ORDER — LISDEXAMFETAMINE DIMESYLATE 60 MG PO CAPS: 60.0000 mg | ORAL_CAPSULE | ORAL | 0 refills | Status: DC

## 2023-12-01 NOTE — Patient Instructions (Signed)
 We did not make any medication changes today.
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
## 2023-12-04 DIAGNOSIS — F28 Other psychotic disorder not due to a substance or known physiological condition: Secondary | ICD-10-CM | POA: Diagnosis not present

## 2023-12-04 DIAGNOSIS — F4389 Other reactions to severe stress: Secondary | ICD-10-CM | POA: Diagnosis not present

## 2023-12-09 ENCOUNTER — Ambulatory Visit (INDEPENDENT_AMBULATORY_CARE_PROVIDER_SITE_OTHER): Admitting: Family Medicine

## 2023-12-09 ENCOUNTER — Encounter (INDEPENDENT_AMBULATORY_CARE_PROVIDER_SITE_OTHER): Payer: Self-pay | Admitting: Family Medicine

## 2023-12-09 VITALS — BP 108/72 | HR 98 | Temp 98.0°F | Ht 65.0 in | Wt 295.0 lb

## 2023-12-09 DIAGNOSIS — E669 Obesity, unspecified: Secondary | ICD-10-CM

## 2023-12-09 DIAGNOSIS — Z6841 Body Mass Index (BMI) 40.0 and over, adult: Secondary | ICD-10-CM | POA: Diagnosis not present

## 2023-12-09 DIAGNOSIS — F4389 Other reactions to severe stress: Secondary | ICD-10-CM | POA: Diagnosis not present

## 2023-12-09 DIAGNOSIS — F28 Other psychotic disorder not due to a substance or known physiological condition: Secondary | ICD-10-CM | POA: Diagnosis not present

## 2023-12-09 DIAGNOSIS — I10 Essential (primary) hypertension: Secondary | ICD-10-CM

## 2023-12-09 DIAGNOSIS — N926 Irregular menstruation, unspecified: Secondary | ICD-10-CM

## 2023-12-09 DIAGNOSIS — E66813 Obesity, class 3: Secondary | ICD-10-CM

## 2023-12-09 DIAGNOSIS — R7303 Prediabetes: Secondary | ICD-10-CM

## 2023-12-09 DIAGNOSIS — E559 Vitamin D deficiency, unspecified: Secondary | ICD-10-CM | POA: Diagnosis not present

## 2023-12-09 MED ORDER — VITAMIN D (ERGOCALCIFEROL) 1.25 MG (50000 UNIT) PO CAPS: 50000.0000 [IU] | ORAL_CAPSULE | ORAL | 1 refills | Status: DC

## 2023-12-09 MED ORDER — VITAMIN D (ERGOCALCIFEROL) 1.25 MG (50000 UNIT) PO CAPS: 50000.0000 [IU] | ORAL_CAPSULE | ORAL | 0 refills | Status: DC

## 2023-12-09 MED ORDER — B COMPLEX VITAMINS PO CAPS: 1.0000 | ORAL_CAPSULE | Freq: Every day | ORAL | Status: AC

## 2023-12-09 NOTE — Progress Notes (Signed)
 Kathy Rios, D.O.  ABFM, ABOM Specializing in Clinical Bariatric Medicine  Office located at: [address] [place], [place]  [address]   Assessment and Plan:  No orders of the defined types were placed in this encounter.  Medications Discontinued During This Encounter  Medication Reason   lisdexamfetamine (VYVANSE ) 60 MG capsule    b complex vitamins capsule Reorder   Vitamin D , Ergocalciferol , (DRISDOL ) 1.25 MG (50000 UNIT) CAPS capsule Reorder   Vitamin D , Ergocalciferol , (DRISDOL ) 1.25 MG (50000 UNIT) CAPS capsule     Meds ordered this encounter  Medications   DISCONTD: Vitamin D , Ergocalciferol , (DRISDOL ) 1.25 MG (50000 UNIT) CAPS capsule    Sig: Take 1 capsule (50,000 Units total) by mouth every 7 (seven) days.    Dispense:  4 capsule    Refill:  0   b complex vitamins capsule    Sig: Take 1 capsule by mouth daily.   Vitamin D , Ergocalciferol , (DRISDOL ) 1.25 MG (50000 UNIT) CAPS capsule    Sig: Take 1 capsule (50,000 Units total) by mouth every 7 (seven) days.    Dispense:  4 capsule    Refill:  1     FOR THE DISEASE OF OBESITY:  BMI 45.0-49.9, adult (HCC) - Current 48.92 Starting (BMI) of 45.0 to 49.9 in adult, unspecified obesity type [facility]) Assessment & Plan: Since last office visit on 11/23/2023, patient's muscle mass has decreased by 0.6 lbs. Fat mass has increased by 1.4 lbs. Total body water has increased by 1.4 lbs.  Counseling done on how various foods will affect these numbers and how to maximize success  Total lbs lost to date: 0 lbs Total weight loss percentage to date: 0%    Recommended Dietary Goals Kathy Rios is currently in the action stage of change. As such, her goal is to continue weight management plan.  She has agreed to: continue current plan   Behavioral Intervention We discussed the following today: work on tracking and journaling calories using tracking application and continue to practice mindfulness when eating  Additional  resources provided today: Handout on Food Journaling Log and Handout on Common Characteristics of Successful Weight Losers,  Evidence-based interventions for health behavior change were utilized today including the discussion of self monitoring techniques, problem-solving barriers and SMART goal setting techniques.   Regarding patient's less desirable eating habits and patterns, we employed the technique of small changes.   Pt will specifically work on: Write down food intake daily for next visit.    Recommended Physical Activity Goals Kathy Rios has been advised to work up to 300-450 minutes of moderate intensity aerobic activity a week and strengthening exercises 2-3 times per week for cardiovascular health, weight loss maintenance and preservation of muscle mass.   She has agreed to:  Think about enjoyable ways to increase daily physical activity and overcoming barriers to exercise   Pharmacotherapy We both agreed to : continue with nutritional and behavioral strategies   FOR ASSOCIATED CONDITIONS ADDRESSED TODAY:  Prediabetes Assessment & Plan: Lab Results  Component Value Date   HGBA1C 5.8 (H) 08/26/2023   HGBA1C 5.9 (H) 06/11/2023   HGBA1C 5.8 10/28/2022  Kathy Rios is not taking any medications for this condition. She was on Metformin  in the past but was experiencing N/V. Pt feels better overall and an increase in energy when she eats less carbs and more protein. Per last obtained labs, A1c decreased but was still in PreDM range. Informed pt of benefits of restarting Metformin  to further decrease A1c  levels. Kathy Rios prefers to stay off of medication at this time d/t past SE. She agrees to continue focusing on increasing protein and decreasing carbs. Will monitor levels closely.    Vitamin D  deficiency Assessment & Plan: Kathy Rios is on ERGO 50,000 units once weekly. Pt is tolerating well, however reports she feels it makes her feel "sluggish" during the day so she takes the  supplement at night. Denies any N/V or muscle weakness. Continue current supplementation regimen. Will refill today.   Relevant Orders: -     Vitamin D  (Ergocalciferol ); Take 1 capsule (50,000 Units total) by mouth every 7 (seven) days.  Dispense: 4 capsule; Refill: 1 -     B Complex Vitamins; Take 1 capsule by mouth daily.    Essential hypertension Assessment & Plan: BP Readings from Last 3 Encounters:  12/09/23 108/72  11/23/23 131/83  11/09/23 125/65  Kathy Rios is taking Benicar  40 mg and Clonidine  0.1 mg daily. Pt is tolerating both medications well, no adverse SE reported. Ambulatory blood pressure monitoring encouraged.  Reminded patient that if they ever feel poorly in any way, to check their blood pressure and pulse as well. Lifestyle changes such as following our low salt, heart healthy meal plan and engaging in a regular exercise program discussed. We will continue to monitor symptoms as they relate to the her weight loss journey.   Pre-menstrual cravings Assessment & Plan: Pt is still occasionally snacking during menstrual cycle, but she endorses eating better. For example, instead of a box of donuts she will eat 1-2 pieces of candy. Kathy Rios tries to only eat sweets when she has additional snack calories. Continue to practice mindfulness while eating and decrease carbs in diet. Will continue to monitor.   Follow up:   Return in about 2 weeks (around 12/23/2023). She was informed of the importance of frequent follow up visits to maximize her success with intensive lifestyle modifications for her multiple health conditions.  Subjective:   Chief complaint: Obesity Kathy Rios is here to discuss her progress with her obesity treatment plan. She is journaling 1500-1600 calories with 110+g of protein using modified Cat 2 MP with 6 ounces at lunch as a guide  and states she is following her eating plan approximately 90% of the time. She states she is not exercising.  Interval History:   Kathy Rios is here for a follow up office visit. Since last OV on 11/23/2023, pt is up 1 lb. She has been more consistent with getting in 3 meals daily. Kathy Rios endorses eating at least 8 ounces of protein at dinner daily. Additionally, she has decreased snacking.   Pharmacotherapy for weight loss: She is currently taking no anti-obesity medication.   Review of Systems:  Pertinent positives were addressed with patient today.  Reviewed by clinician on day of visit: allergies, medications, problem list, medical history, surgical history, family history, social history, and previous encounter notes.  Weight Summary and Biometrics   Weight Lost Since Last Visit: 0lb  Weight Gained Since Last Visit: 1lb   Vitals Temp: 98 F (36.7 C) BP: 108/72 Pulse Rate: 98 SpO2: 100 %   Anthropometric Measurements Height: 5\' 5"  (1.651 m) Weight: 295 lb (133.8 kg) BMI (Calculated): 49.09 Weight at Last Visit: 294lb Weight Lost Since Last Visit: 0lb Weight Gained Since Last Visit: 1lb Starting Weight: 295lb Total Weight Loss (lbs): 0 lb (0 kg) Peak Weight: 337lb   Body Composition  Body Fat %: 51.1 % Fat Mass (lbs): 151 lbs Muscle Mass (lbs): 137.2 lbs  Total Body Water (lbs): 97.2 lbs Visceral Fat Rating : 15   Other Clinical Data Fasting: No Labs: No Today's Visit #: 3 Starting Date: 11/09/23    Objective:   PHYSICAL EXAM: Blood pressure 108/72, pulse 98, temperature 98 F (36.7 C), height 5\' 5"  (1.651 m), weight 295 lb (133.8 kg), SpO2 100%. Body mass index is 49.09 kg/m.  General: she is overweight, cooperative and in no acute distress. PSYCH: Has normal mood, affect and thought process.   HEENT: EOMI, sclerae are anicteric. Lungs: Normal breathing effort, no conversational dyspnea. Extremities: Moves * 4 Neurologic: A and O * 3, good insight  DIAGNOSTIC DATA REVIEWED: BMET    Component Value Date/Time   NA 140 09/23/2022 0932   K 4.7 09/23/2022 0932   CL  104 09/23/2022 0932   CO2 22 09/23/2022 0932   GLUCOSE 93 09/23/2022 0932   GLUCOSE 106 (H) 09/23/2021 1824   BUN 10 09/23/2022 0932   CREATININE 0.59 09/23/2022 0932   CALCIUM 9.7 09/23/2022 0932   GFRNONAA >60 09/23/2021 1824   GFRAA 146 08/08/2020 1453   Lab Results  Component Value Date   HGBA1C 5.9 (H) 06/11/2023   HGBA1C 6.0 (H) 05/23/2021   No results found for: "INSULIN " Lab Results  Component Value Date   TSH 2.070 09/23/2022   CBC    Component Value Date/Time   WBC 8.3 11/11/2023 0922   RBC 5.15 (H) 11/11/2023 0922   HGB 10.5 (L) 11/11/2023 0922   HGB 10.9 (L) 09/23/2022 0932   HCT 35.5 (L) 11/11/2023 0922   HCT 35.4 09/23/2022 0932   PLT 378 11/11/2023 0922   PLT 424 09/23/2022 0932   MCV 68.9 (L) 11/11/2023 0922   MCV 67 (L) 09/23/2022 0932   MCH 20.4 (L) 11/11/2023 0922   MCHC 29.6 (L) 11/11/2023 0922   RDW 17.0 (H) 11/11/2023 0922   RDW 15.0 09/23/2022 0932   Iron Studies    Component Value Date/Time   IRON 57 11/11/2023 0922   IRON WILL FOLLOW 05/23/2021 0919   TIBC 345 11/11/2023 0922   TIBC WILL FOLLOW 05/23/2021 0919   FERRITIN 103 11/11/2023 0922   FERRITIN 98 05/23/2021 0919   IRONPCTSAT 17 11/11/2023 0922   IRONPCTSAT WILL FOLLOW 05/23/2021 0919   Lipid Panel     Component Value Date/Time   CHOL 220 (H) 06/11/2023 0851   TRIG 95 06/11/2023 0851   HDL 58 06/11/2023 0851   CHOLHDL 3.8 06/11/2023 0851   LDLCALC 145 (H) 06/11/2023 0851   Hepatic Function Panel     Component Value Date/Time   PROT 7.4 09/23/2022 0932   ALBUMIN 4.3 09/23/2022 0932   AST 16 09/23/2022 0932   ALT 19 09/23/2022 0932   ALKPHOS 142 (H) 09/23/2022 0932   BILITOT 0.2 09/23/2022 0932      Component Value Date/Time   TSH 2.070 09/23/2022 0932   Nutritional Lab Results  Component Value Date   VD25OH 33.1 11/09/2023   VD25OH 37.6 07/06/2023   VD25OH 20.0 (L) 09/23/2022    Attestations:   I, Camryn Mix, acting as a Stage manager for [employer], DO., have compiled all relevant documentation for today's office visit on behalf of Marceil Sensor, DO, while in the presence of [employer], DO.  I have reviewed the above documentation for accuracy and completeness, and I agree with the above. Kathy Rios, D.O.  The 21st Century Cures Act was signed into law in 2016 which includes the topic of electronic  health records.  This provides immediate access to information in MyChart.  This includes consultation notes, operative notes, office notes, lab results and pathology reports.  If you have any questions about what you read please let us  know at your next visit so we can discuss your concerns and take corrective action if need be.  We are right here with you.

## 2023-12-15 ENCOUNTER — Encounter (HOSPITAL_COMMUNITY): Payer: Self-pay | Admitting: Psychiatry

## 2023-12-15 ENCOUNTER — Telehealth (INDEPENDENT_AMBULATORY_CARE_PROVIDER_SITE_OTHER): Admitting: Psychiatry

## 2023-12-15 DIAGNOSIS — Z79899 Other long term (current) drug therapy: Secondary | ICD-10-CM | POA: Diagnosis not present

## 2023-12-15 DIAGNOSIS — F900 Attention-deficit hyperactivity disorder, predominantly inattentive type: Secondary | ICD-10-CM | POA: Diagnosis not present

## 2023-12-15 DIAGNOSIS — N943 Premenstrual tension syndrome: Secondary | ICD-10-CM

## 2023-12-15 DIAGNOSIS — Z5181 Encounter for therapeutic drug level monitoring: Secondary | ICD-10-CM

## 2023-12-15 DIAGNOSIS — F250 Schizoaffective disorder, bipolar type: Secondary | ICD-10-CM | POA: Diagnosis not present
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
## 2023-12-15 NOTE — Patient Instructions (Signed)
 We did not make any medication changes today.

## 2023-12-16 DIAGNOSIS — F28 Other psychotic disorder not due to a substance or known physiological condition: Secondary | ICD-10-CM | POA: Diagnosis not present

## 2023-12-16 DIAGNOSIS — F4389 Other reactions to severe stress: Secondary | ICD-10-CM | POA: Diagnosis not present

## 2023-12-17 ENCOUNTER — Ambulatory Visit (INDEPENDENT_AMBULATORY_CARE_PROVIDER_SITE_OTHER): Admitting: Family Medicine

## 2023-12-22 ENCOUNTER — Encounter (HOSPITAL_COMMUNITY): Payer: Self-pay

## 2023-12-22 NOTE — Telephone Encounter (Signed)
 Your pt

## 2023-12-23 ENCOUNTER — Ambulatory Visit (INDEPENDENT_AMBULATORY_CARE_PROVIDER_SITE_OTHER): Admitting: Family Medicine

## 2023-12-23 ENCOUNTER — Encounter (INDEPENDENT_AMBULATORY_CARE_PROVIDER_SITE_OTHER): Payer: Self-pay | Admitting: Family Medicine

## 2023-12-23 VITALS — BP 140/81 | HR 71 | Temp 98.4°F | Ht 65.0 in | Wt 297.0 lb

## 2023-12-23 DIAGNOSIS — R7303 Prediabetes: Secondary | ICD-10-CM | POA: Diagnosis not present

## 2023-12-23 DIAGNOSIS — E669 Obesity, unspecified: Secondary | ICD-10-CM | POA: Diagnosis not present

## 2023-12-23 DIAGNOSIS — F4389 Other reactions to severe stress: Secondary | ICD-10-CM | POA: Diagnosis not present

## 2023-12-23 DIAGNOSIS — Z6841 Body Mass Index (BMI) 40.0 and over, adult: Secondary | ICD-10-CM

## 2023-12-23 DIAGNOSIS — I10 Essential (primary) hypertension: Secondary | ICD-10-CM

## 2023-12-23 DIAGNOSIS — F28 Other psychotic disorder not due to a substance or known physiological condition: Secondary | ICD-10-CM | POA: Diagnosis not present

## 2023-12-23 NOTE — Progress Notes (Signed)
 Office: [phone]  /  Fax: [phone]  WEIGHT SUMMARY AND BIOMETRICS  Anthropometric Measurements Height: 5\' 5"  (1.651 m) Weight: 297 lb (134.7 kg) BMI (Calculated): 49.42 Weight at Last Visit: 295 lb Weight Lost Since Last Visit: 0 Weight Gained Since Last Visit: 2 lb Starting Weight: 295 lb Total Weight Loss (lbs): 0 lb (0 kg) Peak Weight: 337 lb   Body Composition  Body Fat %: 52.2 % Fat Mass (lbs): 155.4 lbs Muscle Mass (lbs): 135.2 lbs Total Body Water (lbs): 98.8 lbs Visceral Fat Rating : 16   Other Clinical Data Fasting: No Labs: No Today's Visit #: 4 Starting Date: 11/09/23    Chief Complaint: OBESITY  History of Present Illness Kathy Rios "Kathy Rios" is a 24 year old female with obesity and prediabetes who presents for obesity treatment plan assessment and progress evaluation.  She is on a category two eating plan with a calorie goal of 1500 to 1600 calories and a protein goal of 110 grams or more daily. She adheres to the plan 75% of the time but has gained two pounds since starting the program. She finds it easier to meet her protein goal than her calorie goal, especially when eating at home. Tracking nutrition is challenging when dining out, particularly at local restaurants without posted nutrition information. She has reduced her carbohydrate intake from 160 grams to 90 grams per day, which has led to brain fog, possibly due to carbohydrate withdrawal. She has a history of consuming large amounts of sweets but has significantly reduced her intake, now practicing portion control.  She has not been exercising yet. Her blood pressure is elevated at 140/81, and she is currently taking Benicar  and clonidine  for management. She has a history of prediabetes since age [age] and recalls an insulin  level of 34 from a test done in January. She has been tracking her calories since 2021 using the [employer] app. She reports a slow metabolism, confirmed by tests at  [facility] in January and March, both showing a metabolic rate of 2956.  She has a family history of obesity and possible PCOS, as her mother and grandmother are obese and have hirsutism. She experiences minimal hirsutism herself. She has a history of taking metformin  for 19 years but stopped last year when switched to Ozempic , which caused stomach issues. She was then switched to Zepbound , which was well-tolerated. She is currently on Vyvanse , which helps with appetite control but is primarily for focus.      PHYSICAL EXAM:  Blood pressure (!) 140/81, pulse 71, temperature 98.4 F (36.9 C), height 5\' 5"  (1.651 m), weight 297 lb (134.7 kg), SpO2 94%. Body mass index is 49.42 kg/m.  DIAGNOSTIC DATA REVIEWED:  BMET    Component Value Date/Time   NA 140 09/23/2022 0932   K 4.7 09/23/2022 0932   CL 104 09/23/2022 0932   CO2 22 09/23/2022 0932   GLUCOSE 93 09/23/2022 0932   GLUCOSE 106 (H) 09/23/2021 1824   BUN 10 09/23/2022 0932   CREATININE 0.59 09/23/2022 0932   CALCIUM 9.7 09/23/2022 0932   GFRNONAA >60 09/23/2021 1824   GFRAA 146 08/08/2020 1453   Lab Results  Component Value Date   HGBA1C 5.9 (H) 06/11/2023   HGBA1C 6.0 (H) 05/23/2021   No results found for: "INSULIN " Lab Results  Component Value Date   TSH 2.070 09/23/2022   CBC    Component Value Date/Time   WBC 8.3 11/11/2023 0922   RBC 5.15 (H) 11/11/2023 2130  HGB 10.5 (L) 11/11/2023 0922   HGB 10.9 (L) 09/23/2022 0932   HCT 35.5 (L) 11/11/2023 0922   HCT 35.4 09/23/2022 0932   PLT 378 11/11/2023 0922   PLT 424 09/23/2022 0932   MCV 68.9 (L) 11/11/2023 0922   MCV 67 (L) 09/23/2022 0932   MCH 20.4 (L) 11/11/2023 0922   MCHC 29.6 (L) 11/11/2023 0922   RDW 17.0 (H) 11/11/2023 0922   RDW 15.0 09/23/2022 0932   Iron Studies    Component Value Date/Time   IRON 57 11/11/2023 0922   IRON WILL FOLLOW 05/23/2021 0919   TIBC 345 11/11/2023 0922   TIBC WILL FOLLOW 05/23/2021 0919   FERRITIN 103  11/11/2023 0922   FERRITIN 98 05/23/2021 0919   IRONPCTSAT 17 11/11/2023 0922   IRONPCTSAT WILL FOLLOW 05/23/2021 0919   Lipid Panel     Component Value Date/Time   CHOL 220 (H) 06/11/2023 0851   TRIG 95 06/11/2023 0851   HDL 58 06/11/2023 0851   CHOLHDL 3.8 06/11/2023 0851   LDLCALC 145 (H) 06/11/2023 0851   Hepatic Function Panel     Component Value Date/Time   PROT 7.4 09/23/2022 0932   ALBUMIN 4.3 09/23/2022 0932   AST 16 09/23/2022 0932   ALT 19 09/23/2022 0932   ALKPHOS 142 (H) 09/23/2022 0932   BILITOT 0.2 09/23/2022 0932      Component Value Date/Time   TSH 2.070 09/23/2022 0932   Nutritional Lab Results  Component Value Date   VD25OH 33.1 11/09/2023   VD25OH 37.6 07/06/2023   VD25OH 20.0 (L) 09/23/2022     Assessment and Plan Assessment & Plan Obesity Obesity management includes a category two eating plan with a calorie goal of 1500-1600 and protein goal of 110 grams. She is effectively tracking calories and protein intake but struggles with eating out. Weight has increased by 2 pounds. Emphasized maintaining muscle mass to prevent metabolic slowdown. She is currently plateauing after significant weight loss. Discussed insulin  resistance's role in weight management and potential metformin  benefits. Highlighted focusing on nutritional intake over restrictions to avoid increased cortisol and insulin  production. - Continue category two eating plan with calorie and protein goals. - Encourage journaling of food intake. - Start metformin  500 mg once daily to aid in weight management. - Schedule follow-up appointments every 2 to 3 weeks to monitor progress.  Prediabetes Long-standing prediabetes with insulin  resistance and elevated insulin  levels. Discussed insulin 's role in weight gain and hunger signals. Metformin  is beneficial for weight management and insulin  resistance. Discussed potential gastrointestinal side effects of metformin  and efficacy differences  between immediate and extended-release formulations. - Start metformin  500 mg once daily with breakfast. - Monitor for gastrointestinal side effects and adjust as needed. - Consider extended-release formulation if gastrointestinal side effects occur.  Hypertension Blood pressure is elevated at 140/81 mmHg, managed with Benicar  and clonidine . Weight management is crucial. Discussed diet and exercise's importance in managing hypertension. - Continue Benicar  and clonidine  for blood pressure control. - Recheck blood pressure in 2 to 3 weeks. - Adjust medications if blood pressure remains elevated.     I have personally spent 42 minutes total time today in preparation, patient care, and documentation for this visit, including the following: review of clinical lab tests; review of medical history, and nutritional and behavioral counseling   She was informed of the importance of frequent follow up visits to maximize her success with intensive lifestyle modifications for her multiple health conditions.    Jasmine Mesi, MD

## 2023-12-23 NOTE — Telephone Encounter (Signed)
 Your pt

## 2023-12-24 ENCOUNTER — Other Ambulatory Visit (INDEPENDENT_AMBULATORY_CARE_PROVIDER_SITE_OTHER): Payer: Self-pay | Admitting: Family Medicine

## 2023-12-24 ENCOUNTER — Encounter (INDEPENDENT_AMBULATORY_CARE_PROVIDER_SITE_OTHER): Payer: Self-pay | Admitting: Family Medicine

## 2023-12-24 MED ORDER — METFORMIN HCL 500 MG PO TABS
500.0000 mg | ORAL_TABLET | Freq: Every day | ORAL | 0 refills | Status: DC
Start: 2023-12-24 — End: 2024-01-14

## 2023-12-25 IMAGING — DX DG WRIST COMPLETE 3+V*L*
4 series · 4 of 4 positions shown · non-contrast
Comparison: None.

CLINICAL DATA: Left wrist pain.  No injury.

EXAM:
LEFT WRIST - COMPLETE 3+ VIEW

[wrist pa]
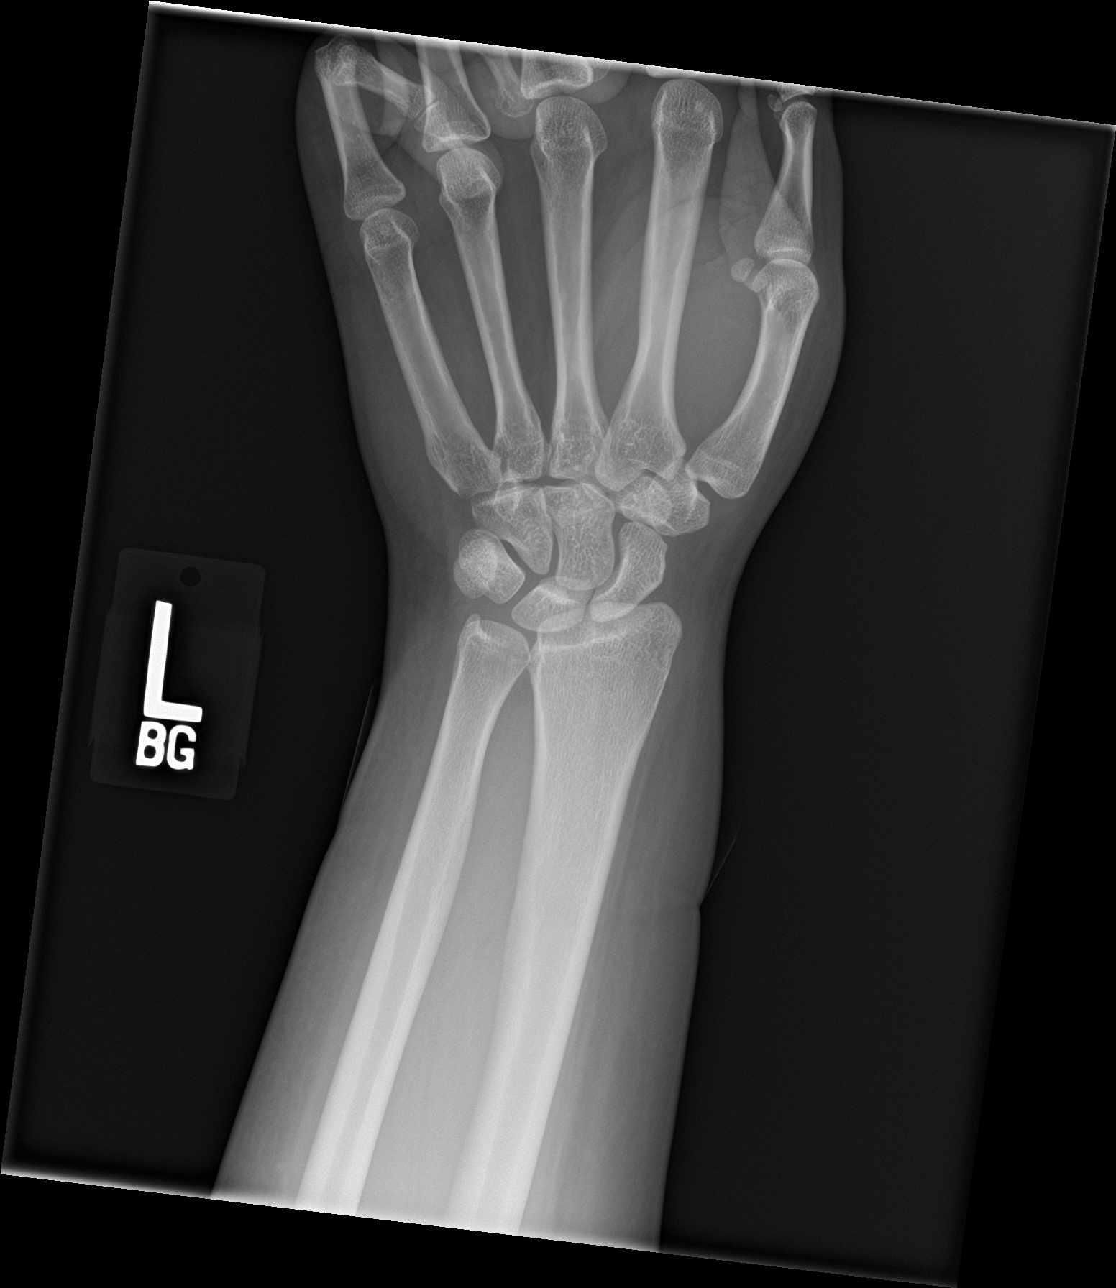

[wrist obl]
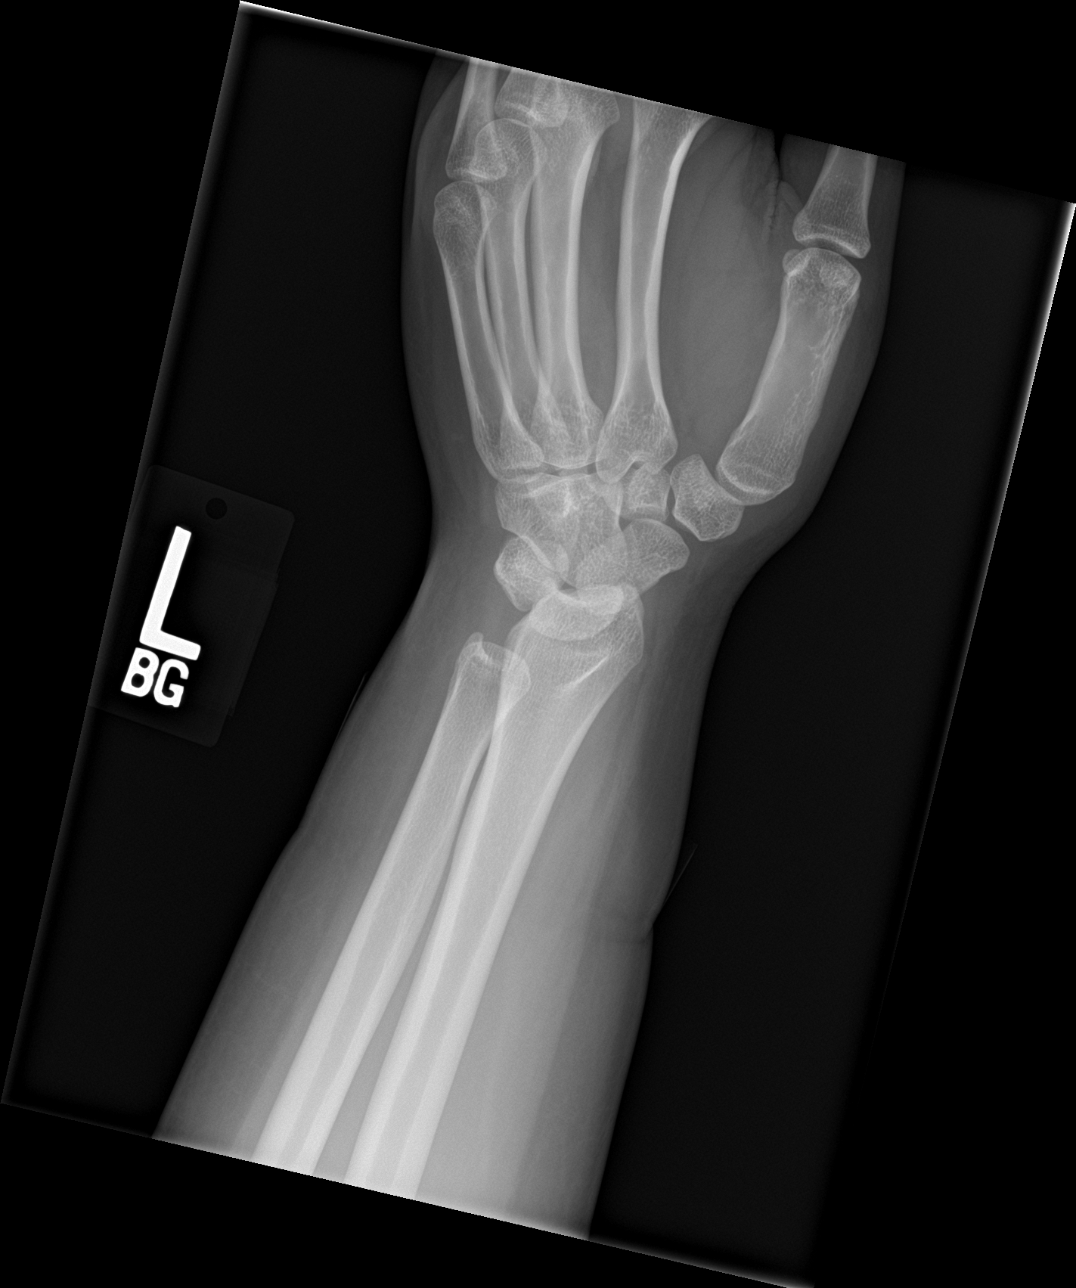

[wrist lat]
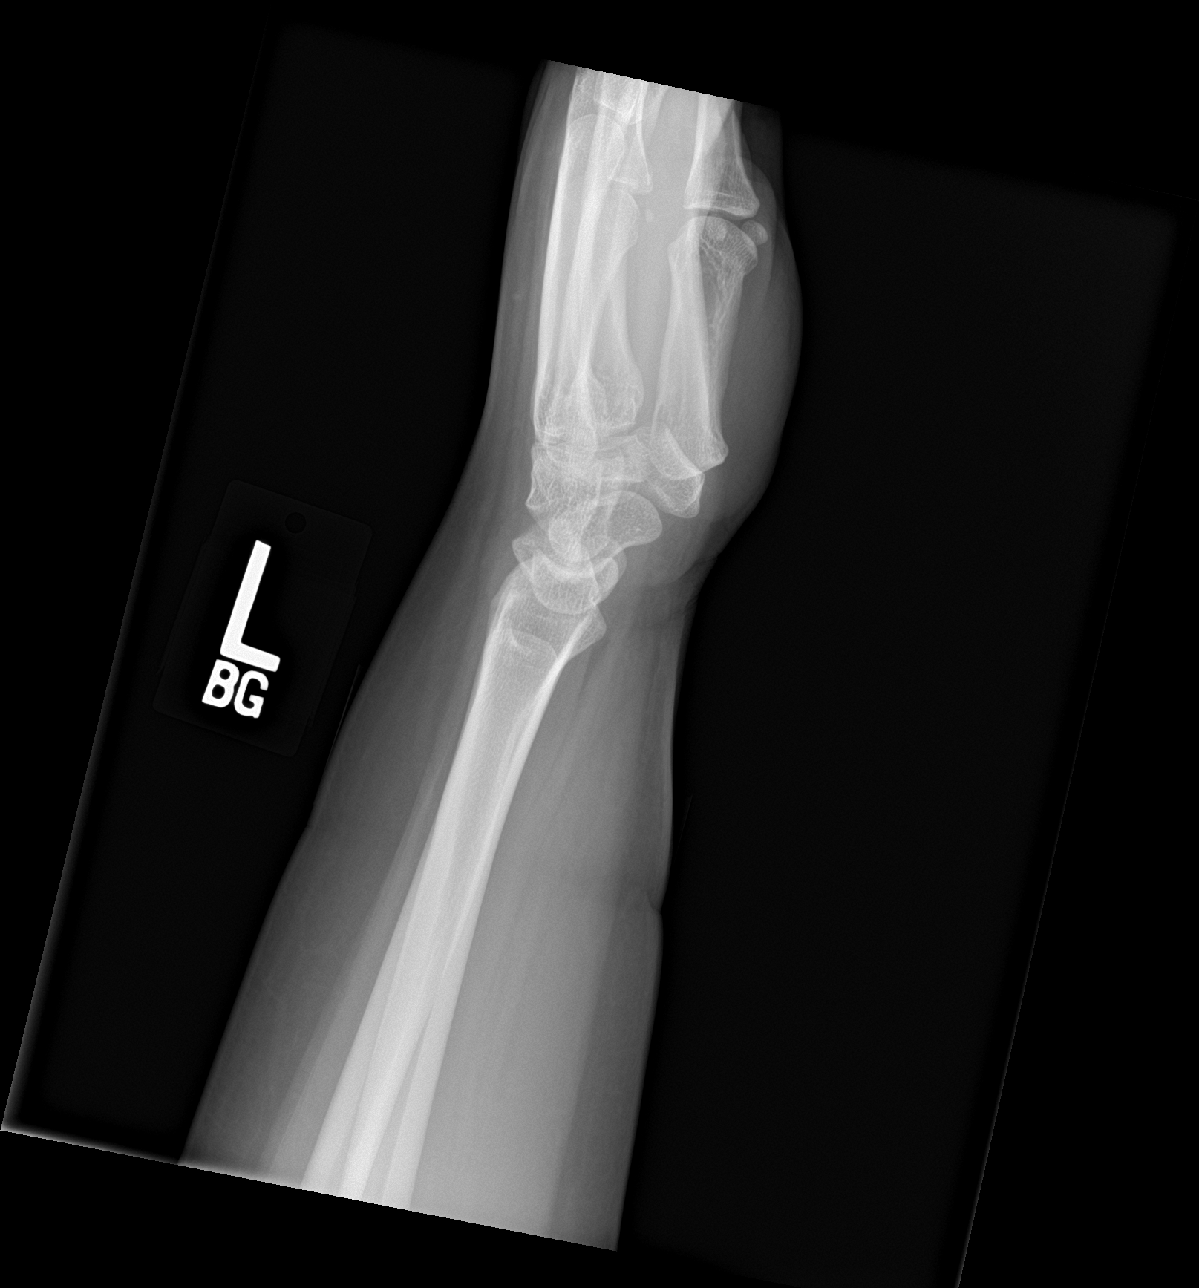

[wrist navicular]
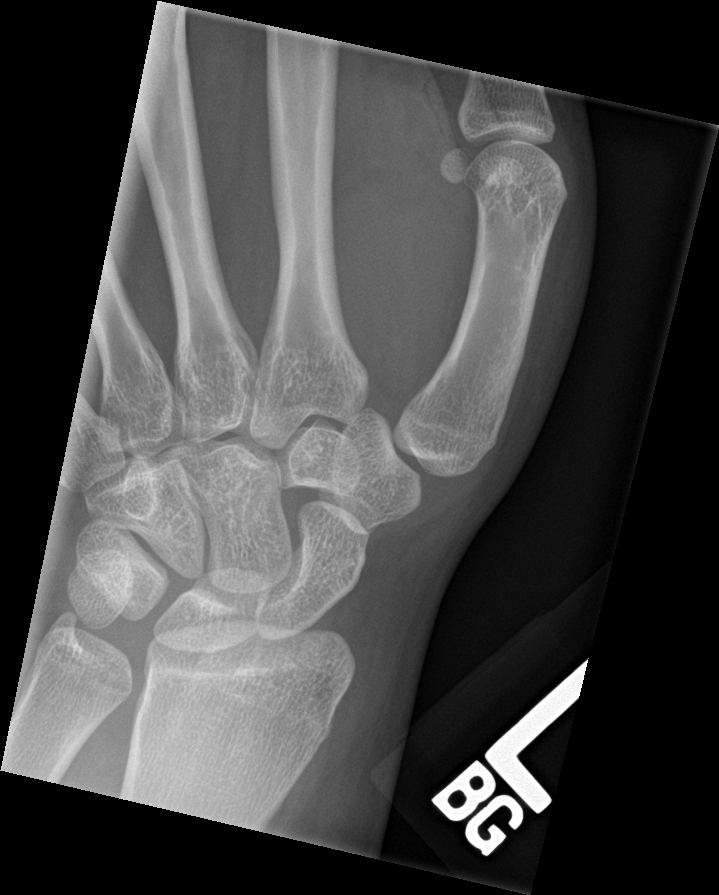

[4 of 4 positions shown; findings below may reference images not displayed]

FINDINGS: There is no evidence of fracture or dislocation. There is no
evidence of arthropathy or other focal bone abnormality. Soft
tissues are unremarkable.
IMPRESSION: Negative.

## 2023-12-28 ENCOUNTER — Ambulatory Visit: Payer: MEDICAID | Admitting: Physician Assistant

## 2023-12-28 ENCOUNTER — Other Ambulatory Visit: Payer: MEDICAID

## 2023-12-29 ENCOUNTER — Encounter (HOSPITAL_COMMUNITY): Payer: Self-pay | Admitting: Psychiatry

## 2023-12-29 ENCOUNTER — Telehealth (INDEPENDENT_AMBULATORY_CARE_PROVIDER_SITE_OTHER): Admitting: Psychiatry

## 2023-12-29 DIAGNOSIS — Z5181 Encounter for therapeutic drug level monitoring: Secondary | ICD-10-CM

## 2023-12-29 DIAGNOSIS — Z6841 Body Mass Index (BMI) 40.0 and over, adult: Secondary | ICD-10-CM

## 2023-12-29 DIAGNOSIS — F900 Attention-deficit hyperactivity disorder, predominantly inattentive type: Secondary | ICD-10-CM | POA: Diagnosis not present

## 2023-12-29 DIAGNOSIS — F250 Schizoaffective disorder, bipolar type: Secondary | ICD-10-CM

## 2023-12-29 DIAGNOSIS — Z79899 Other long term (current) drug therapy: Secondary | ICD-10-CM | POA: Diagnosis not present

## 2023-12-29 MED ORDER — LISDEXAMFETAMINE DIMESYLATE 60 MG PO CAPS: 60.0000 mg | ORAL_CAPSULE | ORAL | 0 refills | Status: AC

## 2023-12-29 MED ORDER — LISDEXAMFETAMINE DIMESYLATE 60 MG PO CAPS: 60.0000 mg | ORAL_CAPSULE | ORAL | 0 refills | Status: DC

## 2023-12-29 MED ORDER — LISDEXAMFETAMINE DIMESYLATE 60 MG PO CAPS: 60.0000 mg | ORAL_CAPSULE | Freq: Every day | ORAL | 0 refills | Status: AC

## 2023-12-29 MED ORDER — CLONIDINE HCL 0.1 MG PO TABS: 0.1000 mg | ORAL_TABLET | Freq: Every evening | ORAL | 5 refills | Status: DC

## 2023-12-29 MED ORDER — LURASIDONE HCL 40 MG PO TABS: 40.0000 mg | ORAL_TABLET | Freq: Every evening | ORAL | 5 refills | Status: AC
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
## 2023-12-29 NOTE — Patient Instructions (Signed)
 We did not make any medication changes today.

## 2023-12-31 DIAGNOSIS — F4389 Other reactions to severe stress: Secondary | ICD-10-CM | POA: Diagnosis not present

## 2023-12-31 DIAGNOSIS — F28 Other psychotic disorder not due to a substance or known physiological condition: Secondary | ICD-10-CM | POA: Diagnosis not present

## 2024-01-04 ENCOUNTER — Other Ambulatory Visit: Payer: MEDICAID

## 2024-01-04 ENCOUNTER — Ambulatory Visit (INDEPENDENT_AMBULATORY_CARE_PROVIDER_SITE_OTHER): Payer: MEDICAID | Admitting: Internal Medicine

## 2024-01-04 ENCOUNTER — Encounter: Payer: Self-pay | Admitting: Internal Medicine

## 2024-01-04 ENCOUNTER — Ambulatory Visit: Payer: MEDICAID | Admitting: Physician Assistant

## 2024-01-04 VITALS — BP 134/83 | HR 91 | Ht 65.0 in | Wt 298.0 lb

## 2024-01-04 DIAGNOSIS — E611 Iron deficiency: Secondary | ICD-10-CM

## 2024-01-04 DIAGNOSIS — R5383 Other fatigue: Secondary | ICD-10-CM | POA: Diagnosis not present

## 2024-01-04 DIAGNOSIS — R7303 Prediabetes: Secondary | ICD-10-CM

## 2024-01-04 DIAGNOSIS — F250 Schizoaffective disorder, bipolar type: Secondary | ICD-10-CM

## 2024-01-04 DIAGNOSIS — I10 Essential (primary) hypertension: Secondary | ICD-10-CM

## 2024-01-04 DIAGNOSIS — Z6841 Body Mass Index (BMI) 40.0 and over, adult: Secondary | ICD-10-CM

## 2024-01-04 DIAGNOSIS — F900 Attention-deficit hyperactivity disorder, predominantly inattentive type: Secondary | ICD-10-CM | POA: Diagnosis not present

## 2024-01-04 MED ORDER — OLMESARTAN MEDOXOMIL 40 MG PO TABS: 40.0000 mg | ORAL_TABLET | Freq: Every day | ORAL | 6 refills | Status: AC

## 2024-01-04 NOTE — Assessment & Plan Note (Signed)
 Closely followed by psychiatry.  Stable on Latuda .

## 2024-01-04 NOTE — Patient Instructions (Signed)
 It was a pleasure to see you today.  Thank you for giving us  the opportunity to be involved in your care.  Below is a brief recap of your visit and next steps.  We will plan to see you again in 6 months.  Summary No medication changes today Olmesartan  refilled Repeat labs ordered Follow up in 6 months

## 2024-01-04 NOTE — Assessment & Plan Note (Signed)
 Symptoms are adequately controlled with Vyvanse .

## 2024-01-04 NOTE — Assessment & Plan Note (Signed)
 Her acute concern is fatigue that has worsened within the last 2 weeks since resuming metformin .  Likely medication side effect but will update basic labs to rule out contributing metabolic etiologies given known history of iron deficiency.

## 2024-01-04 NOTE — Assessment & Plan Note (Signed)
 She has establish care with healthy weight and wellness.  Current weight 298 pounds.  BMI 49.5.  She has lost 14 pounds since January.

## 2024-01-04 NOTE — Progress Notes (Signed)
 Established Patient Office Visit  Subjective   Patient ID: Kathy Rios, female    DOB: 2001-07-01  Age: 23 y.o. MRN: [id number]  Chief Complaint  Patient presents with   Care Management    Six month follow up    Fatigue    Patient feels fatigued, no energy , sleeping later than usual    Kathy Rios returns to care today for routine follow-up.  She was last evaluated by me on 2/20 for an acute visit through video encounter in the setting of URI symptoms.  Previously seen by me for routine follow-up in November 2024.  In the interim she has been seen by psychiatry, healthy weight and wellness, and allergy /immunology.  Today she endorses fatigue that has worsened within the last 2 weeks since starting metformin .  She is otherwise asymptomatic and has no acute concerns to discuss.  Past Medical History:  Diagnosis Date   ADD (attention deficit disorder)    ADHD (attention deficit hyperactivity disorder)    Alpha thalassemia trait 08/20/2012   Anemia    Anxiety    Back pain    Bipolar 1 disorder (HCC)    Chronic constipation 03/08/2018   Depression    Diabetes mellitus, type II (HCC)    Edema of both lower extremities    Generalized anxiety disorder 07/03/2022   Hypertension    Insomnia 06/27/2022   Iron deficiency    Irregular periods    Major depressive disorder 03/22/2018   Multiple food allergies    Obesity    PMS (premenstrual syndrome) 11/05/2015   PTSD (post-traumatic stress disorder)    Reactive hypoglycemia    Schizoaffective disorder, depressive type (HCC)    SOB (shortness of breath) on exertion    Thalassemia trait, alpha    Urticaria    Past Surgical History:  Procedure Laterality Date   ADENOIDECTOMY     TONSILLECTOMY     WISDOM TOOTH EXTRACTION  07/2016   Social History   Tobacco Use   Smoking status: Never    Passive exposure: Current   Smokeless tobacco: Never  Vaping Use   Vaping status: Never Used  Substance Use Topics   Alcohol use: Yes     Comment: once per month will have 1 alcohol unit   Drug use: Not Currently    Types: Marijuana    Comment: previously every 3-6 months but hasn't smoked in a few years   Family History  Problem Relation Age of Onset   Heart disease Mother    Hypertension Mother    Urticaria Mother    Asthma Mother    Allergic rhinitis Mother    Seizures Mother    Arthritis Mother    Diabetes Mother    Hyperlipidemia Mother    Depression Mother    Anxiety disorder Mother    Sleep apnea Mother    Obesity Mother    Alcohol abuse Father    Diabetes Father    Hyperlipidemia Father    Hypertension Father    Gout Father    Dementia Father    Drug abuse Father    Hypertension Sister    Eczema Brother    Mental illness Brother    Hyperlipidemia Brother    ADD / ADHD Brother    Depression Brother    Hypertension Maternal Grandmother    Cancer Maternal Grandmother    Hypertension Maternal Grandfather    Thyroid disease Maternal Grandfather    Cancer Maternal Grandfather    Prostate cancer Maternal Grandfather  Anemia Paternal Grandmother    Cancer Paternal Grandmother        cervical   Hypertension Paternal Grandfather    Heart disease Paternal Grandfather    Schizophrenia Maternal Aunt    Bipolar disorder Maternal Aunt    Alcohol abuse Maternal Uncle    Hypertension Other    Other Other        heart skips-maternal great grandma   Other Other        fibroids- maternal grandma and great grandma   Heart disease Other    Allergies  Allergen Reactions   Selenium Sulfide Rash   Amoxicillin    Banana Other (See Comments)    headache   Motrin [Ibuprofen] Hives and Itching   Sulfa Antibiotics Rash   Review of Systems  Constitutional:  Positive for malaise/fatigue. Negative for chills and fever.  HENT:  Negative for sore throat.   Respiratory:  Negative for cough and shortness of breath.   Cardiovascular:  Negative for chest pain, palpitations and leg swelling.  Gastrointestinal:   Negative for abdominal pain, blood in stool, constipation, diarrhea, nausea and vomiting.  Genitourinary:  Negative for dysuria and hematuria.  Musculoskeletal:  Negative for myalgias.  Skin:  Negative for itching and rash.  Neurological:  Negative for dizziness and headaches.  Psychiatric/Behavioral:  Negative for depression and suicidal ideas.      Objective:     BP 134/83   Pulse 91   Ht 5\' 5"  (1.651 m)   Wt 298 lb (135.2 kg)   SpO2 98%   BMI 49.59 kg/m  BP Readings from Last 3 Encounters:  01/04/24 134/83  12/23/23 (!) 140/81  12/09/23 108/72   Physical Exam Vitals reviewed.  Constitutional:      General: She is not in acute distress.    Appearance: Normal appearance. She is obese. She is not toxic-appearing.  HENT:     Head: Normocephalic and atraumatic.     Right Ear: External ear normal.     Left Ear: External ear normal.     Nose: Nose normal. No congestion or rhinorrhea.     Mouth/Throat:     Mouth: Mucous membranes are moist.     Pharynx: Oropharynx is clear. No oropharyngeal exudate or posterior oropharyngeal erythema.  Eyes:     General: No scleral icterus.    Extraocular Movements: Extraocular movements intact.     Conjunctiva/sclera: Conjunctivae normal.     Pupils: Pupils are equal, round, and reactive to light.  Cardiovascular:     Rate and Rhythm: Normal rate and regular rhythm.     Pulses: Normal pulses.     Heart sounds: Normal heart sounds. No murmur heard.    No friction rub. No gallop.  Pulmonary:     Effort: Pulmonary effort is normal.     Breath sounds: Normal breath sounds. No wheezing, rhonchi or rales.  Abdominal:     General: Abdomen is flat. Bowel sounds are normal. There is no distension.     Palpations: Abdomen is soft.     Tenderness: There is no abdominal tenderness.  Musculoskeletal:        General: No swelling. Normal range of motion.     Cervical back: Normal range of motion.     Right lower leg: No edema.     Left lower  leg: No edema.  Lymphadenopathy:     Cervical: No cervical adenopathy.  Skin:    General: Skin is warm and dry.     Capillary Refill:  Capillary refill takes less than 2 seconds.     Coloration: Skin is not jaundiced.  Neurological:     General: No focal deficit present.     Mental Status: She is alert and oriented to person, place, and time.  Psychiatric:        Mood and Affect: Mood normal.        Behavior: Behavior normal.   Last CBC Lab Results  Component Value Date   WBC 8.3 11/11/2023   HGB 10.5 (L) 11/11/2023   HCT 35.5 (L) 11/11/2023   MCV 68.9 (L) 11/11/2023   MCH 20.4 (L) 11/11/2023   RDW 17.0 (H) 11/11/2023   PLT 378 11/11/2023   Last metabolic panel Lab Results  Component Value Date   GLUCOSE 93 09/23/2022   NA 140 09/23/2022   K 4.7 09/23/2022   CL 104 09/23/2022   CO2 22 09/23/2022   BUN 10 09/23/2022   CREATININE 0.59 09/23/2022   EGFR 130 09/23/2022   CALCIUM 9.7 09/23/2022   PROT 7.4 09/23/2022   ALBUMIN 4.3 09/23/2022   LABGLOB 3.1 09/23/2022   AGRATIO 1.4 09/23/2022   BILITOT 0.2 09/23/2022   ALKPHOS 142 (H) 09/23/2022   AST 16 09/23/2022   ALT 19 09/23/2022   ANIONGAP 8 09/23/2021   Last lipids Lab Results  Component Value Date   CHOL 220 (H) 06/11/2023   HDL 58 06/11/2023   LDLCALC 145 (H) 06/11/2023   TRIG 95 06/11/2023   CHOLHDL 3.8 06/11/2023   Last hemoglobin A1c Lab Results  Component Value Date   HGBA1C 5.9 (H) 06/11/2023   Last thyroid functions Lab Results  Component Value Date   TSH 2.070 09/23/2022   Last vitamin D  Lab Results  Component Value Date   VD25OH 33.1 11/09/2023   Last vitamin B12 and Folate Lab Results  Component Value Date   VITAMINB12 589 11/09/2023   FOLATE 19.9 06/08/2023     Assessment & Plan:   Problem List Items Addressed This Visit       Essential hypertension   Remains adequately controlled with olmesartan  40 mg daily.  Refill provided today.      Adult BMI 45.0-49.9 kg/sq m (HCC)  - Primary (Chronic)   She has establish care with healthy weight and wellness.  Current weight 298 pounds.  BMI 49.5.  She has lost 14 pounds since January.      Schizoaffective disorder, bipolar type (HCC) (Chronic)   Closely followed by psychiatry.  Stable on Latuda .      Attention deficit hyperactivity disorder (ADHD), predominantly inattentive type   Symptoms are adequately controlled with Vyvanse .      Prediabetes   A1c 5.9 on labs from October 2024.  Today she endorses increasing fatigue since recently resuming metformin .  This may be a medication side effect but will update basic labs to rule out contributing metabolic etiologies.       Fatigue   Her acute concern is fatigue that has worsened within the last 2 weeks since resuming metformin .  Likely medication side effect but will update basic labs to rule out contributing metabolic etiologies given known history of iron deficiency.       Return in about 6 months (around 07/06/2024).   Tobi Fortes, MD

## 2024-01-04 NOTE — Assessment & Plan Note (Signed)
 Remains adequately controlled with olmesartan  40 mg daily.  Refill provided today.

## 2024-01-04 NOTE — Assessment & Plan Note (Signed)
 A1c 5.9 on labs from October 2024.  Today she endorses increasing fatigue since recently resuming metformin .  This may be a medication side effect but will update basic labs to rule out contributing metabolic etiologies.

## 2024-01-05 ENCOUNTER — Ambulatory Visit: Payer: Self-pay | Admitting: Internal Medicine

## 2024-01-05 ENCOUNTER — Ambulatory Visit (INDEPENDENT_AMBULATORY_CARE_PROVIDER_SITE_OTHER): Admitting: Obstetrics and Gynecology

## 2024-01-05 VITALS — BP 130/82 | HR 91 | Wt 299.0 lb

## 2024-01-05 DIAGNOSIS — N926 Irregular menstruation, unspecified: Secondary | ICD-10-CM

## 2024-01-05 LAB — CMP14+EGFR
ALT: 15 IU/L (ref 0–32)
AST: 19 IU/L (ref 0–40)
Albumin: 4.1 g/dL (ref 4.0–5.0)
Alkaline Phosphatase: 124 IU/L — ABNORMAL HIGH (ref 44–121)
BUN/Creatinine Ratio: 19 (ref 9–23)
BUN: 13 mg/dL (ref 6–20)
Bilirubin Total: <0.2 mg/dL (ref 0.0–1.2)
CO2: 20 mmol/L (ref 20–29)
Calcium: 9.7 mg/dL (ref 8.7–10.2)
Chloride: 104 mmol/L (ref 96–106)
Creatinine, Ser: 0.7 mg/dL (ref 0.57–1.00)
Globulin, Total: 3.6 g/dL (ref 1.5–4.5)
Glucose: 95 mg/dL (ref 70–99)
Potassium: 4.6 mmol/L (ref 3.5–5.2)
Sodium: 140 mmol/L (ref 134–144)
Total Protein: 7.7 g/dL (ref 6.0–8.5)
eGFR: 124 mL/min/{1.73_m2} (ref >59)

## 2024-01-05 LAB — CBC WITH DIFFERENTIAL/PLATELET
Basophils Absolute: 0 10*3/uL (ref 0.0–0.2)
Basos: 1 %
EOS (ABSOLUTE): 0.1 10*3/uL (ref 0.0–0.4)
Eos: 2 %
Hematocrit: 36.2 % (ref 34.0–46.6)
Hemoglobin: 10.8 g/dL — ABNORMAL LOW (ref 11.1–15.9)
Immature Grans (Abs): 0 10*3/uL (ref 0.0–0.1)
Immature Granulocytes: 0 %
Lymphocytes Absolute: 2.6 10*3/uL (ref 0.7–3.1)
Lymphs: 37 %
MCH: 21.3 pg — ABNORMAL LOW (ref 26.6–33.0)
MCHC: 29.8 g/dL — ABNORMAL LOW (ref 31.5–35.7)
MCV: 71 fL — ABNORMAL LOW (ref 79–97)
Monocytes Absolute: 0.5 10*3/uL (ref 0.1–0.9)
Monocytes: 7 %
Neutrophils Absolute: 3.8 10*3/uL (ref 1.4–7.0)
Neutrophils: 53 %
Platelets: 414 10*3/uL (ref 150–450)
RBC: 5.08 x10E6/uL (ref 3.77–5.28)
RDW: 16.1 % — ABNORMAL HIGH (ref 11.7–15.4)
WBC: 7.2 10*3/uL (ref 3.4–10.8)

## 2024-01-05 LAB — IRON,TIBC AND FERRITIN PANEL
Ferritin: 200 ng/mL — ABNORMAL HIGH (ref 15–150)
Iron Saturation: 13 % — ABNORMAL LOW (ref 15–55)
Iron: 36 ug/dL (ref 27–159)
Total Iron Binding Capacity: 281 ug/dL (ref 250–450)
UIBC: 245 ug/dL (ref 131–425)

## 2024-01-05 LAB — LIPID PANEL
Chol/HDL Ratio: 4.2 ratio (ref 0.0–4.4)
Cholesterol, Total: 236 mg/dL — ABNORMAL HIGH (ref 100–199)
HDL: 56 mg/dL (ref >39)
LDL Chol Calc (NIH): 163 mg/dL — ABNORMAL HIGH (ref 0–99)
Triglycerides: 94 mg/dL (ref 0–149)
VLDL Cholesterol Cal: 17 mg/dL (ref 5–40)

## 2024-01-05 LAB — B12 AND FOLATE PANEL
Folate: 8.5 ng/mL (ref >3.0)
Vitamin B-12: 683 pg/mL (ref 232–1245)

## 2024-01-05 LAB — VITAMIN D 25 HYDROXY (VIT D DEFICIENCY, FRACTURES): Vit D, 25-Hydroxy: 34.8 ng/mL (ref 30.0–100.0)

## 2024-01-05 LAB — HEMOGLOBIN A1C
Est. average glucose Bld gHb Est-mCnc: 123 mg/dL
Hgb A1c MFr Bld: 5.9 % — ABNORMAL HIGH (ref 4.8–5.6)

## 2024-01-05 LAB — TSH+FREE T4
Free T4: 0.99 ng/dL (ref 0.82–1.77)
TSH: 1.39 u[IU]/mL (ref 0.450–4.500)

## 2024-01-05 NOTE — Progress Notes (Signed)
   RETURN GYNECOLOGY VISIT  Subjective:  Kathy Rios is a 23 y.o. G0P0000  presents with concerns about PCOS symptoms and difficulty losing weight.  Note written with AI scribe software with consent from patient.  She is concerned about potential PCOS symptoms, despite a 2022 ultrasound showing no cysts. Her therapist suggested checking androgen levels, FSH, and LH, which were not previously assessed. She is experiencing difficulty losing weight despite attending  [facility]. She has been restarted on metformin  500 mg once daily for prediabetes and insulin  resistance.  She has been on birth control pills since age 89 due to irregular and heavy menstrual cycles, which began at age 72. Her cycles were initially irregular, occurring two to three times a month, prompting the initiation of birth control to regulate them at age 40. Recently switched to Slynd  due to HTN and desire for medication to help with PMDD.  She recently stopped taking the pill two weeks ago and has not yet resumed her menstrual cycle. Single, not regularly sexually active.   No excessive hair growth, acne, or significant hair thinning.   Objective:   Vitals:   01/05/24 1031  BP: 130/82  Pulse: 91  Weight: 299 lb (135.6 kg)    General:  Alert, oriented and cooperative. Patient is in no acute distress.  Skin: Skin is warm and dry. No rash noted.   Cardiovascular: Normal heart rate noted  Respiratory: Normal respiratory effort, no problems with respiration noted  Abdomen: Soft, non-tender, non-distended   Pelvic: NEFG.   Exam performed in the presence of a chaperone  Assessment and Plan:  Kathy Rios is a 23 y.o. with difficultly losing weight and desire for PCOS testing  - Will wait additional 4 weeks then do testing for PCOS/AUB with TSH, PRL, FSH/E2, and total T.  - May resume Slynd  after results come back - Discussed management will not change with PCOS diagnosis. Already on  progestin for endometrial protection and metformin  for insulin  resistance - Pt inquired about IUD - discussed excellent contraceptive option, but may not help with PMDD symptoms  Return in about 4 weeks (around 02/02/2024) for lab visit for prolactin, TSH, FSH, estradiol , and total testosterone.  Total encounter time: 24 minutes  Future Appointments  Date Time Provider Department Center  01/12/2024 10:00 AM Saucier, Murry Art, NP [facility] None  01/13/2024  9:30 AM [facility] None  01/13/2024 10:30 AM Sonnie Dusky, PA-C [facility] None  01/14/2024 10:00 AM Napoleon Backer D, NP [facility] None  02/05/2024 10:00 AM [facility] None  02/10/2024 11:00 AM Jasmine Mesi D, MD [facility] None  03/02/2024  9:00 AM Glenora Laos, MD [facility] None  07/06/2024 10:20 AM Alison Irvine, FNP [facility]  10/26/2024 11:00 AM Rochester Chuck, MD [facility] None   Izell Marsh, MD

## 2024-01-05 NOTE — Progress Notes (Signed)
 Pt states she has not yet started on BC pill.   Pt states she is interested in PCOS testing and discuss if other [facility] options are better.  Pt has upcoming appt with Hematolgy due to lower hgb.

## 2024-01-12 ENCOUNTER — Ambulatory Visit (INDEPENDENT_AMBULATORY_CARE_PROVIDER_SITE_OTHER): Admitting: Psychiatry

## 2024-01-12 ENCOUNTER — Encounter: Payer: Self-pay | Admitting: Psychiatry

## 2024-01-12 VITALS — BP 122/86 | HR 108 | Temp 98.3°F | Ht 65.0 in | Wt 300.6 lb

## 2024-01-12 DIAGNOSIS — F4310 Post-traumatic stress disorder, unspecified: Secondary | ICD-10-CM

## 2024-01-12 DIAGNOSIS — F250 Schizoaffective disorder, bipolar type: Secondary | ICD-10-CM | POA: Diagnosis not present

## 2024-01-12 DIAGNOSIS — Z79899 Other long term (current) drug therapy: Secondary | ICD-10-CM | POA: Diagnosis not present

## 2024-01-12 DIAGNOSIS — N943 Premenstrual tension syndrome: Secondary | ICD-10-CM

## 2024-01-12 DIAGNOSIS — F900 Attention-deficit hyperactivity disorder, predominantly inattentive type: Secondary | ICD-10-CM

## 2024-01-12 NOTE — Progress Notes (Unsigned)
 [facility] [address], [place] [address]   CLINIC:  Medical Oncology/Hematology  PCP:  Tobi Fortes, MD [address] [place] [address] [phone]   REASON FOR VISIT:  Follow-up for microcytic anemia  PRIOR THERAPY: None  CURRENT THERAPY: Under workup  INTERVAL HISTORY:   Ms. Knobloch 23 y.o. female returns for routine follow-up of her microcytic anemia.  She was last seen by Sheril Dines PA-C on 06/30/2023. She denies any interim changes in her symptoms or health status since her last visit.   She reports intermittent fatigue, but this has improved 75%. She denies any ice pica.   She denies any rectal bleeding or melena.   She has monthly menstrual periods, which are moderate; previous menorrhagia improved after starting OCP (temporarily on hold while being tested for PCOS). She is taking ferrous sulfate  325 mg every other day.  ASSESSMENT & PLAN:  1.  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 - Patient was told to have alpha thalassemia trait when she was a child at [facility], but records not available - Labs from 06/08/2023: Hgb at baseline 10.8 with MCV 67.1.  Mild thrombocytosis with platelets 403. Hemoglobin fractionation cascade unremarkable. 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  Normal B12 391, normal MMA.  Normal folate and copper . Normal LDH and reticulocytes. 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  She may also have some mild functional iron deficiency and reactive thrombocytosis (related to iron deficiency and obesity). - She has a history of menorrhagia, on OCP since age 61, with improvement in menstrual bleeding. - Started on  ferrous sulfate  every other day in November 2024.   - Most recent labs (01/04/2024): Hgb 10.8/MCV 71.0 (baseline).  Platelets mildly elevated at 414.  Normal WBC/differential. Ferritin 200, iron saturation 13% - PLAN: Continue ferrous sulfate  325 mg due to mild iron deficiency, but advised her that in general she should be wary of iron supplementation in the setting of alpha thalassemia minor. - We discussed diagnosis of alpha thalassemia minor as the cause of her mild anemia.  We discussed that there could be reproductive implications. - Repeat labs (CBC/D, ferritin, iron/TIBC) with office visit in 1 year.  If no new abnormalities at follow-up, will consider discharge from clinic at that time.    2.  Social/family history: - She is a non-smoker. - Mother is alpha thalassemia carrier.  Maternal grandfather had prostate cancer.  Paternal grandmother had cervical cancer.  PLAN SUMMARY: >> Labs in 1 year = CBC/D, ferritin, iron/TIBC >> OFFICE visit in 1 year (1 week after labs)    REVIEW OF SYSTEMS:   Review of Systems  Constitutional:  Positive for fatigue. Negative for appetite change, chills, diaphoresis, fever and unexpected weight change.  HENT:   Negative for lump/mass and nosebleeds.   Eyes:  Negative for eye problems.  Respiratory:  Positive for shortness of breath (with exertion). Negative for cough and hemoptysis.   Cardiovascular:  Positive for palpitations. Negative for chest pain and leg swelling.  Gastrointestinal:  Positive for constipation. Negative for abdominal pain, blood in stool, diarrhea, nausea and vomiting.  Genitourinary:  Negative for hematuria.   Musculoskeletal:  Positive for back pain.  Skin: Negative.   Neurological:  Negative for  dizziness, headaches and light-headedness.  Hematological:  Does not bruise/bleed easily.     PHYSICAL EXAM:  ECOG PERFORMANCE STATUS: 1 - Symptomatic but completely ambulatory  Vitals:   01/13/24 1037  BP: (!) 126/49  Pulse:  83  Resp: 18  Temp: (!) 97.4 F (36.3 C)  SpO2: 100%    Filed Weights   01/13/24 1037  Weight: 298 lb (135.2 kg)    Physical Exam Constitutional:      Appearance: Normal appearance. She is morbidly obese.  Cardiovascular:     Heart sounds: Normal heart sounds.  Pulmonary:     Breath sounds: Normal breath sounds.  Neurological:     General: No focal deficit present.     Mental Status: Mental status is at baseline.  Psychiatric:        Behavior: Behavior normal. Behavior is cooperative.    PAST MEDICAL/SURGICAL HISTORY:  Past Medical History:  Diagnosis Date   ADD (attention deficit disorder)    ADHD (attention deficit hyperactivity disorder)    Alpha thalassemia trait 08/20/2012   Anemia    Anxiety    Back pain    Bipolar 1 disorder (HCC)    Chronic constipation 03/08/2018   Depression    Diabetes mellitus, type II (HCC)    Edema of both lower extremities    Generalized anxiety disorder 07/03/2022   Hypertension    Insomnia 06/27/2022   Iron deficiency    Irregular periods    Major depressive disorder 03/22/2018   Multiple food allergies    Obesity    PMS (premenstrual syndrome) 11/05/2015   PTSD (post-traumatic stress disorder)    Reactive hypoglycemia    Schizoaffective disorder, depressive type (HCC)    SOB (shortness of breath) on exertion    Thalassemia trait, alpha    Urticaria    Past Surgical History:  Procedure Laterality Date   ADENOIDECTOMY     TONSILLECTOMY     WISDOM TOOTH EXTRACTION  07/2016    SOCIAL HISTORY:  Social History   Socioeconomic History   Marital status: Single    Spouse name: Not on file   Number of children: 0   Years of education: Not on file   Highest education level: 12th grade  Occupational History   Not on file  Tobacco Use   Smoking status: Never    Passive exposure: Current   Smokeless tobacco: Never  Vaping Use   Vaping status: Never Used  Substance and Sexual Activity   Alcohol use: Yes    Comment:  once per month will have 1 alcohol unit   Drug use: Not Currently    Types: Marijuana    Comment: previously every 3-6 months but hasn't smoked in a few years   Sexual activity: Not Currently    Birth control/protection: Pill, Abstinence  Other Topics Concern   Not on file  Social History Narrative   Not on file   Social Drivers of Health   Financial Resource Strain: Low Risk  (09/03/2023)   Overall Financial Resource Strain (CARDIA)    Difficulty of Paying Living Expenses: Not hard at all  Recent Concern: Financial Resource Strain - Medium Risk (06/09/2023)   Overall Financial Resource Strain (CARDIA)    Difficulty of Paying Living Expenses: Somewhat hard  Food Insecurity: No Food Insecurity (09/03/2023)   Hunger Vital Sign    Worried About Running Out of Food in the Last Year: Never true    Ran Out of Food in the Last Year: Never true  Transportation Needs: No Transportation Needs (09/03/2023)   PRAPARE - Administrator, Civil Service (Medical): No    Lack of Transportation (Non-Medical): No  Physical Activity: Sufficiently Active (09/03/2023)   Exercise Vital Sign    Days of Exercise per Week: 3 days    Minutes of Exercise per Session: 60 min  Recent Concern: Physical Activity - Insufficiently Active (08/26/2023)   Exercise Vital Sign    Days of Exercise per Week: 2 days    Minutes of Exercise per Session: 60 min  Stress: No Stress Concern Present (09/03/2023)   [employer] of Occupational Health - Occupational Stress Questionnaire    Feeling of Stress : Not at all  Social Connections: Moderately Integrated (09/03/2023)   Social Connection and Isolation Panel [NHANES]    Frequency of Communication with Friends and Family: More than three times a week    Frequency of Social Gatherings with Friends and Family: Once a week    Attends Religious Services: 1 to 4 times per year    Active Member of [employer] or Organizations: Yes    Attends Engineer, structural:  More than 4 times per year    Marital Status: Never married  Intimate Partner Violence: Not At Risk (05/12/2022)   Humiliation, Afraid, Rape, and Kick questionnaire    Fear of Current or Ex-Partner: No    Emotionally Abused: No    Physically Abused: No    Sexually Abused: No    FAMILY HISTORY:  Family History  Problem Relation Age of Onset   Heart disease Mother    Hypertension Mother    Urticaria Mother    Asthma Mother    Allergic rhinitis Mother    Seizures Mother    Arthritis Mother    Diabetes Mother    Hyperlipidemia Mother    Depression Mother    Anxiety disorder Mother    Sleep apnea Mother    Obesity Mother    Alcohol abuse Father    Diabetes Father    Hyperlipidemia Father    Hypertension Father    Gout Father    Dementia Father    Drug abuse Father    Eczema Brother    Mental illness Brother    Hyperlipidemia Brother    ADD / ADHD Brother    Depression Brother    Schizophrenia Maternal Aunt    Bipolar disorder Maternal Aunt    Alcohol abuse Maternal Uncle    Hypertension Maternal Grandfather    Thyroid disease Maternal Grandfather    Cancer Maternal Grandfather    Prostate cancer Maternal Grandfather    Hypertension Maternal Grandmother    Cancer Maternal Grandmother    Hypertension Paternal Grandfather    Heart disease Paternal Grandfather    Anemia Paternal Grandmother    Cancer Paternal Grandmother        cervical   Hypertension Other    Other Other        heart skips-maternal great grandma   Other Other        fibroids- maternal grandma and great grandma   Heart disease Other     CURRENT MEDICATIONS:  Outpatient Encounter Medications as of 01/13/2024  Medication Sig   acetaminophen  (TYLENOL ) 500 MG tablet Take 1,000 mg by mouth every 6 (six) hours as needed for mild pain or headache.   ascorbic acid (VITAMIN C) 500 MG tablet Take 500 mg by mouth daily.   b complex vitamins capsule Take 1 capsule by mouth daily.   cloNIDine  (  CATAPRES ) 0.1  MG tablet Take 1 tablet (0.1 mg total) by mouth at bedtime.   Drospirenone  (SLYND ) 4 MG TABS Take 1 tablet (4 mg total) by mouth daily.   ferrous sulfate  325 (65 FE) MG tablet Take 325 mg by mouth daily with breakfast.   [START ON 03/30/2024] lisdexamfetamine (VYVANSE ) 60 MG capsule Take 1 capsule (60 mg total) by mouth daily.   lisdexamfetamine (VYVANSE ) 60 MG capsule Take 1 capsule (60 mg total) by mouth every morning.   [START ON 01/29/2024] lisdexamfetamine (VYVANSE ) 60 MG capsule Take 1 capsule (60 mg total) by mouth every morning.   [START ON 02/28/2024] lisdexamfetamine (VYVANSE ) 60 MG capsule Take 1 capsule (60 mg total) by mouth every morning.   lurasidone  (LATUDA ) 40 MG TABS tablet Take 1 tablet (40 mg total) by mouth at bedtime. With meal.   metFORMIN  (GLUCOPHAGE ) 500 MG tablet Take 1 tablet (500 mg total) by mouth daily with breakfast.   olmesartan  (BENICAR ) 40 MG tablet Take 1 tablet (40 mg total) by mouth daily.   Omega-3 Fatty Acids (OMEGA 3 PO) Take by mouth.   triamcinolone  ointment (KENALOG ) 0.1 % Apply 1 Application topically 2 (two) times daily as needed.   Vitamin D , Ergocalciferol , (DRISDOL ) 1.25 MG (50000 UNIT) CAPS capsule Take 1 capsule (50,000 Units total) by mouth every 7 (seven) days.   No facility-administered encounter medications on file as of 01/13/2024.    ALLERGIES:  Allergies  Allergen Reactions   Selenium Sulfide Rash   Amoxicillin    Banana Other (See Comments)    headache   Motrin [Ibuprofen] Hives and Itching   Sulfa Antibiotics Rash    LABORATORY DATA:  I have reviewed the labs as listed.  CBC    Component Value Date/Time   WBC 8.1 01/13/2024 0930   RBC 4.91 01/13/2024 0930   HGB 10.6 (L) 01/13/2024 0930   HGB 10.8 (L) 01/04/2024 1048   HCT 34.6 (L) 01/13/2024 0930   HCT 36.2 01/04/2024 1048   PLT 347 01/13/2024 0930   PLT 414 01/04/2024 1048   MCV 70.5 (L) 01/13/2024 0930   MCV 71 (L) 01/04/2024 1048   MCH 21.6 (L) 01/13/2024 0930    MCHC 30.6 01/13/2024 0930   RDW 17.8 (H) 01/13/2024 0930   RDW 16.1 (H) 01/04/2024 1048   LYMPHSABS 2.9 01/13/2024 0930   LYMPHSABS 2.6 01/04/2024 1048   MONOABS 0.6 01/13/2024 0930   EOSABS 0.1 01/13/2024 0930   EOSABS 0.1 01/04/2024 1048   BASOSABS 0.1 01/13/2024 0930   BASOSABS 0.0 01/04/2024 1048      Latest Ref Rng & Units 01/04/2024   10:48 AM 09/23/2022    9:32 AM 06/30/2022   10:39 AM  CMP  Glucose 70 - 99 mg/dL 95  93  98   BUN 6 - 20 mg/dL 13  10  11    Creatinine 0.57 - 1.00 mg/dL 1.61  0.96  0.45   Sodium 134 - 144 mmol/L 140  140  136   Potassium 3.5 - 5.2 mmol/L 4.6  4.7  5.0   Chloride 96 - 106 mmol/L 104  104  104   CO2 20 - 29 mmol/L 20  22  21    Calcium 8.7 - 10.2 mg/dL 9.7  9.7  9.1   Total Protein 6.0 - 8.5 g/dL 7.7  7.4  7.5   Total Bilirubin 0.0 - 1.2 mg/dL <4.0  0.2  <9.8   Alkaline Phos 44 - 121 IU/L 124  142  117  AST 0 - 40 IU/L 19  16  12    ALT 0 - 32 IU/L 15  19  18      DIAGNOSTIC IMAGING:  I have independently reviewed the relevant imaging and discussed with the patient.   WRAP UP:  All questions were answered. The patient knows to call the clinic with any problems, questions or concerns.  Medical decision making: Low  Time spent on visit: I spent 15 minutes counseling the patient face to face. The total time spent in the appointment was 22 minutes and more than 50% was on counseling.  Sonnie Dusky, PA-C  01/13/24 12:52 PM
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
## 2024-01-13 ENCOUNTER — Inpatient Hospital Stay (HOSPITAL_BASED_OUTPATIENT_CLINIC_OR_DEPARTMENT_OTHER): Payer: Self-pay | Admitting: Physician Assistant

## 2024-01-13 ENCOUNTER — Inpatient Hospital Stay: Payer: Self-pay | Attending: Hematology

## 2024-01-13 VITALS — BP 126/49 | HR 83 | Temp 97.4°F | Resp 18 | Ht 65.0 in | Wt 298.0 lb

## 2024-01-13 DIAGNOSIS — Z808 Family history of malignant neoplasm of other organs or systems: Secondary | ICD-10-CM | POA: Diagnosis not present

## 2024-01-13 DIAGNOSIS — Z809 Family history of malignant neoplasm, unspecified: Secondary | ICD-10-CM | POA: Insufficient documentation

## 2024-01-13 DIAGNOSIS — Z79899 Other long term (current) drug therapy: Secondary | ICD-10-CM | POA: Diagnosis not present

## 2024-01-13 DIAGNOSIS — E611 Iron deficiency: Secondary | ICD-10-CM

## 2024-01-13 DIAGNOSIS — D563 Thalassemia minor: Secondary | ICD-10-CM | POA: Diagnosis not present

## 2024-01-13 DIAGNOSIS — D509 Iron deficiency anemia, unspecified: Secondary | ICD-10-CM | POA: Insufficient documentation

## 2024-01-13 DIAGNOSIS — Z8042 Family history of malignant neoplasm of prostate: Secondary | ICD-10-CM | POA: Diagnosis not present

## 2024-01-13 LAB — CBC WITH DIFFERENTIAL/PLATELET
Abs Immature Granulocytes: 0.02 10*3/uL (ref 0.00–0.07)
Basophils Absolute: 0.1 10*3/uL (ref 0.0–0.1)
Basophils Relative: 1 %
Eosinophils Absolute: 0.1 10*3/uL (ref 0.0–0.5)
Eosinophils Relative: 2 %
HCT: 34.6 % — ABNORMAL LOW (ref 36.0–46.0)
Hemoglobin: 10.6 g/dL — ABNORMAL LOW (ref 12.0–15.0)
Immature Granulocytes: 0 %
Lymphocytes Relative: 36 %
Lymphs Abs: 2.9 10*3/uL (ref 0.7–4.0)
MCH: 21.6 pg — ABNORMAL LOW (ref 26.0–34.0)
MCHC: 30.6 g/dL (ref 30.0–36.0)
MCV: 70.5 fL — ABNORMAL LOW (ref 80.0–100.0)
Monocytes Absolute: 0.6 10*3/uL (ref 0.1–1.0)
Monocytes Relative: 8 %
Neutro Abs: 4.3 10*3/uL (ref 1.7–7.7)
Neutrophils Relative %: 53 %
Platelets: 347 10*3/uL (ref 150–400)
RBC: 4.91 MIL/uL (ref 3.87–5.11)
RDW: 17.8 % — ABNORMAL HIGH (ref 11.5–15.5)
WBC: 8.1 10*3/uL (ref 4.0–10.5)
nRBC: 0 % (ref 0.0–0.2)

## 2024-01-13 LAB — IRON AND TIBC
Iron: 46 ug/dL (ref 28–170)
Saturation Ratios: 14 % (ref 10.4–31.8)
TIBC: 328 ug/dL (ref 250–450)
UIBC: 282 ug/dL

## 2024-01-13 LAB — FERRITIN: Ferritin: 106 ng/mL (ref 11–307)

## 2024-01-13 NOTE — Patient Instructions (Signed)
 [facility] at [facility] **VISIT SUMMARY & IMPORTANT INSTRUCTIONS **   You were seen today by Sheril Dines PA-C for your anemia.    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 This causes your mild anemia and small red blood cells. In your case, it is not causing any harm, but these low blood levels are simply "your normal." Your hemoglobin tends to run between 10.0 -11.0.  As long as you are within this range, I do not see any cause for concern. As we discussed, this may have reproductive implications, and if you are considering having children in the future, you and your partner may want to discuss this with a Dentist.  IRON DEFICIENCY You have very mild iron deficiency, which is likely related to monthly blood loss from your menstrual periods. You should continue taking over-the-counter iron supplement (ferrous sulfate  325 mg) every other day. This may cause mild constipation.  You can relieve this by drinking plenty of water and eating a high-fiber diet, or taking over-the-counter stool softeners. Will recheck your iron levels in 6 months to see whether or not you should continue taking iron pill.  FOLLOW-UP APPOINTMENT: Labs and office visit in 1 year  ** Thank you for trusting me with your healthcare!  I strive to provide all of my patients with quality care at each visit.  If you receive a survey for this visit, I would be so grateful to you for taking the time to provide feedback.  Thank you in advance!  ~ Damone Fancher                   Dr. Paulett Boros   &   Sheril Dines, PA-C   - - - - - - - - - - - - - - - - - -    Thank you for choosing [facility] at [facility] to provide your oncology and hematology care.  To afford each patient quality time with our provider, please arrive at least 15 minutes before  your scheduled appointment time.   If you have a lab appointment with the Cancer Center please come in thru the Main Entrance and check in at the main information desk.  You need to re-schedule your appointment should you arrive 10 or more minutes late.  We strive to give you quality time with our providers, and arriving late affects you and other patients whose appointments are after yours.  Also, if you no show three or more times for appointments you may be dismissed from the clinic at the providers discretion.     Again, thank you for choosing [facility].  Our hope is that these requests will decrease the amount of time that you wait before being seen by our physicians.       _____________________________________________________________  Should you have questions after your visit to [facility], please contact our office at [phone] and follow the prompts.  Our office hours are 8:00 a.m. and 4:30 p.m. Monday - Friday.  Please note that voicemails left after 4:00 p.m. may not be returned until the following business day.  We are closed weekends and major holidays.  You do have access to a nurse 24-7, just call the main number to the clinic [phone] and do not press any options, hold on the line and a nurse  will answer the phone.    For prescription refill requests, have your pharmacy contact our office and allow 72 hours.

## 2024-01-14 ENCOUNTER — Ambulatory Visit (INDEPENDENT_AMBULATORY_CARE_PROVIDER_SITE_OTHER): Admitting: Adult Health

## 2024-01-14 ENCOUNTER — Encounter (INDEPENDENT_AMBULATORY_CARE_PROVIDER_SITE_OTHER): Payer: Self-pay | Admitting: Adult Health

## 2024-01-14 VITALS — BP 116/76 | HR 101 | Temp 98.3°F | Ht 65.0 in | Wt 294.0 lb

## 2024-01-14 DIAGNOSIS — E611 Iron deficiency: Secondary | ICD-10-CM | POA: Diagnosis not present

## 2024-01-14 DIAGNOSIS — R7303 Prediabetes: Secondary | ICD-10-CM | POA: Diagnosis not present

## 2024-01-14 DIAGNOSIS — F900 Attention-deficit hyperactivity disorder, predominantly inattentive type: Secondary | ICD-10-CM | POA: Diagnosis not present

## 2024-01-14 DIAGNOSIS — E669 Obesity, unspecified: Secondary | ICD-10-CM | POA: Diagnosis not present

## 2024-01-14 DIAGNOSIS — Z6841 Body Mass Index (BMI) 40.0 and over, adult: Secondary | ICD-10-CM

## 2024-01-14 DIAGNOSIS — I10 Essential (primary) hypertension: Secondary | ICD-10-CM | POA: Diagnosis not present

## 2024-01-14 DIAGNOSIS — F4389 Other reactions to severe stress: Secondary | ICD-10-CM | POA: Diagnosis not present

## 2024-01-14 DIAGNOSIS — F28 Other psychotic disorder not due to a substance or known physiological condition: Secondary | ICD-10-CM | POA: Diagnosis not present

## 2024-01-14 MED ORDER — METFORMIN HCL 500 MG PO TABS: 500.0000 mg | ORAL_TABLET | Freq: Two times a day (BID) | ORAL | 0 refills | Status: DC

## 2024-01-14 NOTE — Progress Notes (Signed)
 WEIGHT SUMMARY AND BIOMETRICS  Vitals Temp: 98.3 F (36.8 C) BP: 116/76 Pulse Rate: (!) 101 SpO2: 97 %   Anthropometric Measurements Height: 5\' 5"  (1.651 m) Weight: 294 lb (133.4 kg) BMI (Calculated): 48.92 Weight at Last Visit: 297 lb Weight Lost Since Last Visit: 3 lb Weight Gained Since Last Visit: 0 Starting Weight: 295 lb Total Weight Loss (lbs): 3 lb (1.361 kg) Peak Weight: 337 lb   Body Composition  Body Fat %: 50.5 % Fat Mass (lbs): 148.8 lbs Muscle Mass (lbs): 138.6 lbs Total Body Water (lbs): 96.2 lbs Visceral Fat Rating : 15   Other Clinical Data Fasting: no Labs: no Today's Visit #: 5 Starting Date: 11/09/23    Chief Complaint:   OBESITY Kathy Rios is here to discuss her progress with her obesity treatment plan.  She is on the the Category 2 Plan and states she is following her eating plan approximately 75 % of the time.  She states she is exercising Light Walking 30 minutes 3-4 times per week.  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 Iron deficiency 01/13/2024 Oncology OV Notes: ASSESSMENT & PLAN:  1.  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 - Patient was told to have alpha thalassemia trait when she was a child at [facility], but records not available - Labs from 06/08/2023: Hgb at baseline 10.8 with MCV 67.1.  Mild thrombocytosis with platelets 403. Hemoglobin fractionation cascade unremarkable. Alpha thalassemia genotype testing consistent with alpha thalassemia minor (two of the four alpha globin genes deleted) Ferritin 116, iron  saturation 10%.  Normal B12 391, normal MMA.  Normal folate and copper . Normal LDH and reticulocytes. 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  She may also have some mild functional iron deficiency and reactive thrombocytosis (related to iron deficiency and obesity). - She has a history of menorrhagia, on OCP since age 15, with improvement in menstrual bleeding. - Started on ferrous sulfate  every other day in November 2024.   - Most recent labs (01/04/2024): Hgb 10.8/MCV 71.0 (baseline).  Platelets mildly elevated at 414.  Normal WBC/differential. Ferritin 200, iron saturation 13% - PLAN: Continue ferrous sulfate  325 mg due to mild iron deficiency, but advised her that in general she should be wary of iron supplementation in the setting of alpha thalassemia minor. - We discussed diagnosis of alpha thalassemia minor as the cause of her mild anemia.  We discussed that there could be reproductive implications. - Repeat labs (CBC/D, ferritin, iron/TIBC) with office visit in 1 year.  If no new abnormalities at follow-up, will consider discharge from clinic at that time.    2. Prediabetes Lab Results  Component Value Date   HGBA1C 5.9 (H) 01/04/2024   HGBA1C 5.9 (H) 06/11/2023   HGBA1C 5.8 10/28/2022    12/23/2023 Restarted on daily Metformin  500mg  She denies SE and reports that she was previously on BID therapy- verified via EPIC MAR review She is agreeable to increase dose  3. Essential hypertension BP at goal  She denies CP with exertion She is on: cloNIDine  (CATAPRES ) 0.1 MG tablet- managed by [facility]  olmesartan  (  BENICAR ) 40 MG tablet- managed by PCP   4. Attention deficit hyperactivity disorder (ADHD), predominantly inattentive type PDMP Reviewed - no aberrances noted She reports concentration levels stable and she is able to easily complete ADLs/IADLs  Assessment/Plan:   1. Iron deficiency Continue close f/u with Oncology  2.  Prediabetes Refill and INCREASE metFORMIN  (GLUCOPHAGE ) 500 MG tablet Take 1 tablet (500 mg total) by mouth 2 (two) times daily with a meal. Dispense: 60 tablet, Refills: 0 ordered   3. Essential hypertension Limit Na+ Increase regular exercise Continue antihypertensives per PCP and Psych  4. Attention deficit hyperactivity disorder (ADHD), predominantly inattentive type Continue regular exercise Continue close f/u with Mental Health Care Team  5. Adult BMI 45.0-49.9 kg/sq m (HCC), CURRENT BMI 49.1 (Primary)  Kathy Rios is currently in the action stage of change. As such, her goal is to continue with weight loss efforts. She has agreed to the Category 2 Plan.   Exercise goals: All adults should avoid inactivity. Some physical activity is better than none, and adults who participate in any amount of physical activity gain some health benefits. Adults should also include muscle-strengthening activities that involve all major muscle groups on 2 or more days a week.  Behavioral modification strategies: increasing lean protein intake, decreasing simple carbohydrates, increasing vegetables, increasing water intake, meal planning and cooking strategies, keeping healthy foods in the home, ways to avoid boredom eating, and planning for success.  Kathy Rios has agreed to follow-up with our clinic in 4 weeks. She was informed of the importance of frequent follow-up visits to maximize her success with intensive lifestyle modifications for her multiple health conditions.   Objective:   Blood pressure 116/76, pulse (!) 101, temperature 98.3 F (36.8 C), height 5\' 5"  (1.651 m), weight 294 lb (133.4 kg), last menstrual period 10/30/2023, SpO2 97%. Body mass index is 48.92 kg/m.  General: Cooperative, alert, well developed, in no acute distress. HEENT: Conjunctivae and lids unremarkable. Cardiovascular: Regular rhythm.  Lungs: Normal work of breathing. Neurologic: No focal deficits.   Lab Results   Component Value Date   CREATININE 0.70 01/04/2024   BUN 13 01/04/2024   NA 140 01/04/2024   K 4.6 01/04/2024   CL 104 01/04/2024   CO2 20 01/04/2024   Lab Results  Component Value Date   ALT 15 01/04/2024   AST 19 01/04/2024   ALKPHOS 124 (H) 01/04/2024   BILITOT <0.2 01/04/2024   Lab Results  Component Value Date   HGBA1C 5.9 (H) 01/04/2024   HGBA1C 5.9 (H) 06/11/2023   HGBA1C 5.8 10/28/2022   HGBA1C 6.0 (H) 09/23/2022   HGBA1C 5.8 (H) 06/30/2022   No results found for: "INSULIN " Lab Results  Component Value Date   TSH 1.390 01/04/2024   Lab Results  Component Value Date   CHOL 236 (H) 01/04/2024   HDL 56 01/04/2024   LDLCALC 163 (H) 01/04/2024   TRIG 94 01/04/2024   CHOLHDL 4.2 01/04/2024   Lab Results  Component Value Date   VD25OH 34.8 01/04/2024   VD25OH 33.1 11/09/2023   VD25OH 37.6 07/06/2023   Lab Results  Component Value Date   WBC 8.1 01/13/2024   HGB 10.6 (L) 01/13/2024   HCT 34.6 (L) 01/13/2024   MCV 70.5 (L) 01/13/2024   PLT 347 01/13/2024   Lab Results  Component Value Date   IRON 46 01/13/2024   TIBC 328 01/13/2024   FERRITIN 106 01/13/2024   Attestation Statements:   Reviewed by clinician on day of visit: allergies,  medications, problem list, medical history, surgical history, family history, social history, and previous encounter notes.  I have reviewed the above documentation for accuracy and completeness, and I agree with the above. -  Laden Fieldhouse d. Neilani Duffee, NP-C

## 2024-01-21 DIAGNOSIS — F28 Other psychotic disorder not due to a substance or known physiological condition: Secondary | ICD-10-CM | POA: Diagnosis not present

## 2024-01-21 DIAGNOSIS — F4389 Other reactions to severe stress: Secondary | ICD-10-CM | POA: Diagnosis not present

## 2024-01-25 ENCOUNTER — Telehealth (INDEPENDENT_AMBULATORY_CARE_PROVIDER_SITE_OTHER): Admitting: Psychiatry

## 2024-01-25 DIAGNOSIS — F4310 Post-traumatic stress disorder, unspecified: Secondary | ICD-10-CM

## 2024-01-25 DIAGNOSIS — F250 Schizoaffective disorder, bipolar type: Secondary | ICD-10-CM | POA: Diagnosis not present

## 2024-01-25 DIAGNOSIS — N943 Premenstrual tension syndrome: Secondary | ICD-10-CM

## 2024-01-25 DIAGNOSIS — F900 Attention-deficit hyperactivity disorder, predominantly inattentive type: Secondary | ICD-10-CM
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
## 2024-01-28 DIAGNOSIS — F4389 Other reactions to severe stress: Secondary | ICD-10-CM | POA: Diagnosis not present

## 2024-01-28 DIAGNOSIS — F28 Other psychotic disorder not due to a substance or known physiological condition: Secondary | ICD-10-CM | POA: Diagnosis not present

## 2024-02-03 ENCOUNTER — Other Ambulatory Visit: Payer: Self-pay | Admitting: Psychiatry

## 2024-02-03 DIAGNOSIS — F28 Other psychotic disorder not due to a substance or known physiological condition: Secondary | ICD-10-CM | POA: Diagnosis not present

## 2024-02-03 DIAGNOSIS — F900 Attention-deficit hyperactivity disorder, predominantly inattentive type: Secondary | ICD-10-CM

## 2024-02-03 DIAGNOSIS — Z6841 Body Mass Index (BMI) 40.0 and over, adult: Secondary | ICD-10-CM

## 2024-02-03 DIAGNOSIS — F4389 Other reactions to severe stress: Secondary | ICD-10-CM | POA: Diagnosis not present

## 2024-02-04 ENCOUNTER — Telehealth: Payer: Self-pay

## 2024-02-04 ENCOUNTER — Other Ambulatory Visit: Payer: Self-pay | Admitting: Psychiatry

## 2024-02-04 DIAGNOSIS — F900 Attention-deficit hyperactivity disorder, predominantly inattentive type: Secondary | ICD-10-CM

## 2024-02-04 DIAGNOSIS — Z6841 Body Mass Index (BMI) 40.0 and over, adult: Secondary | ICD-10-CM

## 2024-02-04 MED ORDER — LISDEXAMFETAMINE DIMESYLATE 60 MG PO CAPS: 60.0000 mg | ORAL_CAPSULE | ORAL | 0 refills | Status: AC

## 2024-02-04 NOTE — Telephone Encounter (Signed)
 [facility] states that they did not received a rx to be filled for june. states that they got one from5-13 for that date, one to be fill july-13 and then another one for the 8-13. can you resend rx for the june.

## 2024-02-04 NOTE — Telephone Encounter (Signed)
 left message that rx was sent to the pharmacy and to check with pharmacy later today.

## 2024-02-04 NOTE — Telephone Encounter (Signed)
 Message was left today @ 9:52 that rx had been sent to the pharmacy

## 2024-02-05 ENCOUNTER — Other Ambulatory Visit

## 2024-02-05 DIAGNOSIS — F28 Other psychotic disorder not due to a substance or known physiological condition: Secondary | ICD-10-CM | POA: Diagnosis not present

## 2024-02-05 DIAGNOSIS — N926 Irregular menstruation, unspecified: Secondary | ICD-10-CM

## 2024-02-05 DIAGNOSIS — D563 Thalassemia minor: Secondary | ICD-10-CM

## 2024-02-05 DIAGNOSIS — F4389 Other reactions to severe stress: Secondary | ICD-10-CM | POA: Diagnosis not present

## 2024-02-05 DIAGNOSIS — E611 Iron deficiency: Secondary | ICD-10-CM

## 2024-02-10 ENCOUNTER — Encounter: Payer: Self-pay | Admitting: Obstetrics and Gynecology

## 2024-02-10 ENCOUNTER — Ambulatory Visit (INDEPENDENT_AMBULATORY_CARE_PROVIDER_SITE_OTHER): Admitting: Family Medicine

## 2024-02-10 ENCOUNTER — Encounter (INDEPENDENT_AMBULATORY_CARE_PROVIDER_SITE_OTHER): Payer: Self-pay | Admitting: Family Medicine

## 2024-02-10 VITALS — BP 140/86 | HR 114 | Temp 98.0°F | Ht 65.0 in | Wt 296.0 lb

## 2024-02-10 DIAGNOSIS — F28 Other psychotic disorder not due to a substance or known physiological condition: Secondary | ICD-10-CM | POA: Diagnosis not present

## 2024-02-10 DIAGNOSIS — E559 Vitamin D deficiency, unspecified: Secondary | ICD-10-CM

## 2024-02-10 DIAGNOSIS — R7303 Prediabetes: Secondary | ICD-10-CM

## 2024-02-10 DIAGNOSIS — Z6841 Body Mass Index (BMI) 40.0 and over, adult: Secondary | ICD-10-CM

## 2024-02-10 DIAGNOSIS — F4389 Other reactions to severe stress: Secondary | ICD-10-CM | POA: Diagnosis not present

## 2024-02-10 DIAGNOSIS — E669 Obesity, unspecified: Secondary | ICD-10-CM | POA: Diagnosis not present

## 2024-02-10 DIAGNOSIS — I10 Essential (primary) hypertension: Secondary | ICD-10-CM

## 2024-02-10 LAB — TSH: TSH: 1.72 u[IU]/mL (ref 0.450–4.500)

## 2024-02-10 LAB — TESTOSTERONE,FREE AND TOTAL
Testosterone, Free: 0.4 pg/mL (ref 0.0–4.2)
Testosterone: 14 ng/dL (ref 13–71)

## 2024-02-10 LAB — ESTRADIOL: Estradiol: 49.3 pg/mL

## 2024-02-10 LAB — FOLLICLE STIMULATING HORMONE: FSH: 16.9 m[IU]/mL

## 2024-02-10 LAB — PROLACTIN: Prolactin: 25.1 ng/mL (ref 4.8–33.4)

## 2024-02-10 MED ORDER — METFORMIN HCL 500 MG PO TABS: 500.0000 mg | ORAL_TABLET | Freq: Two times a day (BID) | ORAL | 0 refills | Status: DC

## 2024-02-10 MED ORDER — VITAMIN D (ERGOCALCIFEROL) 1.25 MG (50000 UNIT) PO CAPS: 50000.0000 [IU] | ORAL_CAPSULE | ORAL | 1 refills | Status: DC

## 2024-02-10 NOTE — Progress Notes (Signed)
 Office: [phone]  /  Fax: [phone]  WEIGHT SUMMARY AND BIOMETRICS  Anthropometric Measurements Height: 5' 5 (1.651 m) Weight: 296 lb (134.3 kg) BMI (Calculated): 49.26 Weight at Last Visit: 294 lb Weight Lost Since Last Visit: 0 Weight Gained Since Last Visit: 2 lb Starting Weight: 295 lb Total Weight Loss (lbs): 1 lb (0.454 kg) Peak Weight: 337 lb   Body Composition  Body Fat %: 51.7 % Fat Mass (lbs): 153.2 lbs Muscle Mass (lbs): 136 lbs Total Body Water (lbs): 98 lbs Visceral Fat Rating : 16   Other Clinical Data Fasting: No Labs: No Today's Visit #: 6 Starting Date: 11/09/23    Chief Complaint: OBESITY    History of Present Illness Kathy Rios is a 23 year old female with obesity, hypertension, and prediabetes who presents for obesity treatment plan assessment and progress evaluation.  She follows the category two eating plan approximately seventy percent of the time and engages in cardio exercise for thirty minutes two to three times per week. Despite these efforts, she has gained two pounds in the last four weeks. She incorporates strength training for at least thirty minutes, two to three days a week, and feels great after exercising, although her hunger has increased.  Her blood pressure was elevated today, and she is currently taking Benicar  and clonidine  0.1 mg at night for hypertension. She checks her blood pressure at home, which is usually normal, and attributes the elevated readings in the office to 'white coat syndrome.'  She has a history of prediabetes and is being treated with metformin , which was increased from once a day to twice a day at her last visit. She takes the first dose after breakfast and the second dose at night. The meal plan provided has helped reduce her cravings for sweets by including more proteins and leafy green vegetables.  She is on vitamin D  supplementation and recently refilled her prescription. She has  been experiencing some stress related to school, which she believes may be affecting her blood pressure. She monitors her weight weekly and has noticed fluctuations, which she attributes to water retention, especially in the heat.  No emotional eating despite increased stress. Reports increased hunger with exercise.      PHYSICAL EXAM:  Blood pressure (!) 140/86, pulse (!) 114, temperature 98 F (36.7 C), height 5' 5 (1.651 m), weight 296 lb (134.3 kg), SpO2 100%. Body mass index is 49.26 kg/m.  DIAGNOSTIC DATA REVIEWED:  BMET    Component Value Date/Time   NA 140 01/04/2024 1048   K 4.6 01/04/2024 1048   CL 104 01/04/2024 1048   CO2 20 01/04/2024 1048   GLUCOSE 95 01/04/2024 1048   GLUCOSE 106 (H) 09/23/2021 1824   BUN 13 01/04/2024 1048   CREATININE 0.70 01/04/2024 1048   CALCIUM 9.7 01/04/2024 1048   GFRNONAA >60 09/23/2021 1824   GFRAA 146 08/08/2020 1453   Lab Results  Component Value Date   HGBA1C 5.9 (H) 01/04/2024   HGBA1C 6.0 (H) 05/23/2021   No results found for: INSULIN  Lab Results  Component Value Date   TSH 1.390 01/04/2024   CBC    Component Value Date/Time   WBC 8.1 01/13/2024 0930   RBC 4.91 01/13/2024 0930   HGB 10.6 (L) 01/13/2024 0930   HGB 10.8 (L) 01/04/2024 1048   HCT 34.6 (L) 01/13/2024 0930   HCT 36.2 01/04/2024 1048   PLT 347 01/13/2024 0930   PLT 414 01/04/2024 1048   MCV 70.5 (L)  01/13/2024 0930   MCV 71 (L) 01/04/2024 1048   MCH 21.6 (L) 01/13/2024 0930   MCHC 30.6 01/13/2024 0930   RDW 17.8 (H) 01/13/2024 0930   RDW 16.1 (H) 01/04/2024 1048   Iron Studies    Component Value Date/Time   IRON 46 01/13/2024 0930   IRON 36 01/04/2024 1048   TIBC 328 01/13/2024 0930   TIBC 281 01/04/2024 1048   FERRITIN 106 01/13/2024 0930   FERRITIN 200 (H) 01/04/2024 1048   IRONPCTSAT 14 01/13/2024 0930   IRONPCTSAT 13 (L) 01/04/2024 1048   Lipid Panel     Component Value Date/Time   CHOL 236 (H) 01/04/2024 1048   TRIG 94  01/04/2024 1048   HDL 56 01/04/2024 1048   CHOLHDL 4.2 01/04/2024 1048   LDLCALC 163 (H) 01/04/2024 1048   Hepatic Function Panel     Component Value Date/Time   PROT 7.7 01/04/2024 1048   ALBUMIN 4.1 01/04/2024 1048   AST 19 01/04/2024 1048   ALT 15 01/04/2024 1048   ALKPHOS 124 (H) 01/04/2024 1048   BILITOT <0.2 01/04/2024 1048      Component Value Date/Time   TSH 1.390 01/04/2024 1048   Nutritional Lab Results  Component Value Date   VD25OH 34.8 01/04/2024   VD25OH 33.1 11/09/2023   VD25OH 37.6 07/06/2023     Assessment and Plan Assessment & Plan Obesity She adheres to the category two eating plan approximately 70% of the time and engages in cardio exercise for 30 minutes two to three times weekly, along with strength training. Despite these efforts, she has gained two pounds in the last four weeks, attributed to water retention due to heat rather than fat gain. Emphasized the importance of maintaining healthy habits and increasing exercise frequency to manage hunger and promote weight loss. Discussed strategies for healthier food choices when dining out and stressed the importance of hydration, especially in hot weather. - Continue category two eating plan - Increase exercise frequency to three times per week - Monitor weight and body composition - Encourage healthy eating habits and meal planning - Promote hydration, especially in hot weather  Prediabetes She is treated with metformin , increased to twice daily at the last visit. She is also working on diet, exercise, and weight loss to manage prediabetes. Explained the importance of timing the second dose of metformin  to optimize its effectiveness during waking hours to better control hunger and blood glucose levels. Suggested taking the second dose at lunch to maintain coverage during waking hours. - Take metformin  twice daily, with the second dose at lunch if possible - Continue diet and exercise regimen - Monitor  blood glucose levels  Hypertension Blood pressure was elevated at 159/85 initially and improved to 140/86 upon repeat measurement. She is on Benicar  and clonidine  for blood pressure management. Home blood pressure readings are generally normal, suggesting possible white coat hypertension. Stress from school may be contributing to elevated readings. Discussed the impact of stress and other temporary factors on blood pressure. - Continue Benicar  and clonidine  - Monitor blood pressure at home regularly - Address stress management strategies  Vitamin D  Deficiency She is on vitamin D  supplementation and has recently refilled her prescription. Plans to ensure she has an adequate supply of vitamin D  to prevent any lapse in treatment. - Continue vitamin D  supplementation - Refill vitamin D  prescription early    She was informed of the importance of frequent follow up visits to maximize her success with intensive lifestyle modifications for her  multiple health conditions.    Louann Penton, MD

## 2024-02-11 ENCOUNTER — Telehealth: Payer: Self-pay | Admitting: Obstetrics and Gynecology

## 2024-02-11 NOTE — Telephone Encounter (Signed)
 Lab orders will be placed when pt presents for lab appointment.

## 2024-02-11 NOTE — Telephone Encounter (Signed)
 Pt is calling in to see if she can get lab orders placed for PCOS.  Pt has been scheduled for labs and visit with Dr. Erik.

## 2024-02-16 ENCOUNTER — Ambulatory Visit: Payer: Self-pay | Admitting: Obstetrics and Gynecology

## 2024-02-18 DIAGNOSIS — F4389 Other reactions to severe stress: Secondary | ICD-10-CM | POA: Diagnosis not present

## 2024-02-18 DIAGNOSIS — F28 Other psychotic disorder not due to a substance or known physiological condition: Secondary | ICD-10-CM | POA: Diagnosis not present

## 2024-02-23 ENCOUNTER — Telehealth: Admitting: Psychiatry

## 2024-02-23 DIAGNOSIS — F900 Attention-deficit hyperactivity disorder, predominantly inattentive type: Secondary | ICD-10-CM | POA: Diagnosis not present

## 2024-02-23 DIAGNOSIS — N943 Premenstrual tension syndrome: Secondary | ICD-10-CM | POA: Diagnosis not present

## 2024-02-23 DIAGNOSIS — F250 Schizoaffective disorder, bipolar type: Secondary | ICD-10-CM

## 2024-02-23 DIAGNOSIS — Z79899 Other long term (current) drug therapy: Secondary | ICD-10-CM | POA: Diagnosis not present

## 2024-02-23 DIAGNOSIS — F4310 Post-traumatic stress disorder, unspecified: Secondary | ICD-10-CM
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
## 2024-02-25 DIAGNOSIS — F4389 Other reactions to severe stress: Secondary | ICD-10-CM | POA: Diagnosis not present

## 2024-02-25 DIAGNOSIS — F28 Other psychotic disorder not due to a substance or known physiological condition: Secondary | ICD-10-CM | POA: Diagnosis not present

## 2024-03-02 ENCOUNTER — Encounter (INDEPENDENT_AMBULATORY_CARE_PROVIDER_SITE_OTHER): Payer: Self-pay | Admitting: Family Medicine

## 2024-03-02 ENCOUNTER — Ambulatory Visit (INDEPENDENT_AMBULATORY_CARE_PROVIDER_SITE_OTHER): Admitting: Family Medicine

## 2024-03-02 VITALS — BP 112/68 | HR 89 | Temp 97.5°F | Ht 65.0 in | Wt 294.0 lb

## 2024-03-02 DIAGNOSIS — E669 Obesity, unspecified: Secondary | ICD-10-CM

## 2024-03-02 DIAGNOSIS — R7303 Prediabetes: Secondary | ICD-10-CM | POA: Diagnosis not present

## 2024-03-02 DIAGNOSIS — Z6841 Body Mass Index (BMI) 40.0 and over, adult: Secondary | ICD-10-CM

## 2024-03-02 DIAGNOSIS — E559 Vitamin D deficiency, unspecified: Secondary | ICD-10-CM | POA: Diagnosis not present

## 2024-03-02 MED ORDER — METFORMIN HCL 500 MG PO TABS: 500.0000 mg | ORAL_TABLET | Freq: Two times a day (BID) | ORAL | 0 refills | Status: DC

## 2024-03-02 NOTE — Progress Notes (Signed)
 Office: [phone]  /  Fax: [phone]  WEIGHT SUMMARY AND BIOMETRICS  Anthropometric Measurements Height: 5' 5 (1.651 m) Weight: 294 lb (133.4 kg) BMI (Calculated): 48.92 Weight at Last Visit: 296 lb Weight Lost Since Last Visit: 2 lb Weight Gained Since Last Visit: 0 Starting Weight: 295 lb Total Weight Loss (lbs): 1 lb (0.454 kg) Peak Weight: 337 lb   Body Composition  Body Fat %: 49.8 % Fat Mass (lbs): 146.6 lbs Muscle Mass (lbs): 140.2 lbs Total Body Water (lbs): 94 lbs Visceral Fat Rating : 15   Other Clinical Data Fasting: yes Labs: no Today's Visit #: 7 Starting Date: 11/09/23 Comments: [id number]    Chief Complaint: OBESITY    History of Present Illness Kathy Rios is a 23 year old female with obesity who presents for obesity treatment.  She is adhering to a category two eating plan approximately 75% of the time and engages in physical activity by walking for 60 to 120 minutes three times a week. Over the past three weeks, she has lost two pounds since her last visit.  She experiences increased cravings, particularly for foods like pancakes, tacos, and beef smoked sausage, leading to exceeding her weekly calorie limit by 1,112 calories during the week of the seventh. Despite this, she has managed to lose weight.  Her current dietary goal is to maintain a daily intake of 1,500 to 1,600 calories and at least 110 grams of protein. She often exceeds her protein target, which she believes has contributed to an increase in muscle mass.  She engages in non-exercise activity thermogenesis (NEAT) through activities like walking in large stores, contributing to her overall physical activity. She has a [employer] and previously attended water aerobics three times a week, followed by additional cardio sessions.  Her current medications include metformin  and vitamin D , which she takes weekly. She has no issues with these medications and has  sufficient refills for vitamin D  but requires a refill for metformin .  She has a history of prediabetes and vitamin D  deficiency.      PHYSICAL EXAM:  Blood pressure 112/68, pulse 89, temperature (!) 97.5 F (36.4 C), height 5' 5 (1.651 m), weight 294 lb (133.4 kg), SpO2 98%. Body mass index is 48.92 kg/m.  DIAGNOSTIC DATA REVIEWED:  BMET    Component Value Date/Time   NA 140 01/04/2024 1048   K 4.6 01/04/2024 1048   CL 104 01/04/2024 1048   CO2 20 01/04/2024 1048   GLUCOSE 95 01/04/2024 1048   GLUCOSE 106 (H) 09/23/2021 1824   BUN 13 01/04/2024 1048   CREATININE 0.70 01/04/2024 1048   CALCIUM 9.7 01/04/2024 1048   GFRNONAA >60 09/23/2021 1824   GFRAA 146 08/08/2020 1453   Lab Results  Component Value Date   HGBA1C 5.9 (H) 01/04/2024   HGBA1C 6.0 (H) 05/23/2021   No results found for: INSULIN  Lab Results  Component Value Date   TSH 1.720 02/05/2024   CBC    Component Value Date/Time   WBC 8.1 01/13/2024 0930   RBC 4.91 01/13/2024 0930   HGB 10.6 (L) 01/13/2024 0930   HGB 10.8 (L) 01/04/2024 1048   HCT 34.6 (L) 01/13/2024 0930   HCT 36.2 01/04/2024 1048   PLT 347 01/13/2024 0930   PLT 414 01/04/2024 1048   MCV 70.5 (L) 01/13/2024 0930   MCV 71 (L) 01/04/2024 1048   MCH 21.6 (L) 01/13/2024 0930   MCHC 30.6 01/13/2024 0930   RDW 17.8 (H) 01/13/2024 0930  RDW 16.1 (H) 01/04/2024 1048   Iron Studies    Component Value Date/Time   IRON 46 01/13/2024 0930   IRON 36 01/04/2024 1048   TIBC 328 01/13/2024 0930   TIBC 281 01/04/2024 1048   FERRITIN 106 01/13/2024 0930   FERRITIN 200 (H) 01/04/2024 1048   IRONPCTSAT 14 01/13/2024 0930   IRONPCTSAT 13 (L) 01/04/2024 1048   Lipid Panel     Component Value Date/Time   CHOL 236 (H) 01/04/2024 1048   TRIG 94 01/04/2024 1048   HDL 56 01/04/2024 1048   CHOLHDL 4.2 01/04/2024 1048   LDLCALC 163 (H) 01/04/2024 1048   Hepatic Function Panel     Component Value Date/Time   PROT 7.7 01/04/2024 1048    ALBUMIN 4.1 01/04/2024 1048   AST 19 01/04/2024 1048   ALT 15 01/04/2024 1048   ALKPHOS 124 (H) 01/04/2024 1048   BILITOT <0.2 01/04/2024 1048      Component Value Date/Time   TSH 1.720 02/05/2024 1023   Nutritional Lab Results  Component Value Date   VD25OH 34.8 01/04/2024   VD25OH 33.1 11/09/2023   VD25OH 37.6 07/06/2023     Assessment and Plan Assessment & Plan Obesity She is adhering to a category two eating plan 75% of the time and engages in physical activity by walking 60 to 120 minutes three times a week. She has lost two pounds in the last three weeks, indicating progress in weight management. Despite increased cravings and occasional overeating, exceeding her weekly calorie limit by 1,112 calories, she has gained muscle mass and reduced water retention, suggesting an increase in resting metabolic rate (RMR). Her RMR was previously measured at 2,000 calories, with a target of 2,200 calories. The current regimen is effective, but future plans include adding strength training to maintain muscle mass and support metabolism as weight loss continues. - Continue category two eating plan and track calorie intake - Maintain physical activity with walking and consider adding strength training - Discuss recipes and meal plan adjustments to manage cravings - Refill metformin  prescription  Prediabetes She is on metformin  and is managing her prediabetes through diet and exercise, focusing on protein intake and calorie management, contributing to weight loss and improved metabolic health. - Refill metformin  prescription - Maintain current diet and exercise regimen with consideration for adding strength training - Repeat labs at the end of August to monitor progress  Vitamin D  deficiency She is on a prescription vitamin D  regimen with two refills remaining. The current treatment is being tolerated, though she has reported some cravings and occasional overeating. - Continue prescription  vitamin D  weekly    She was informed of the importance of frequent follow up visits to maximize her success with intensive lifestyle modifications for her multiple health conditions.    Louann Penton, MD

## 2024-03-03 DIAGNOSIS — F4389 Other reactions to severe stress: Secondary | ICD-10-CM | POA: Diagnosis not present

## 2024-03-03 DIAGNOSIS — F28 Other psychotic disorder not due to a substance or known physiological condition: Secondary | ICD-10-CM | POA: Diagnosis not present

## 2024-03-03 DIAGNOSIS — Z6841 Body Mass Index (BMI) 40.0 and over, adult: Secondary | ICD-10-CM | POA: Diagnosis not present

## 2024-03-03 DIAGNOSIS — Z713 Dietary counseling and surveillance: Secondary | ICD-10-CM | POA: Diagnosis not present

## 2024-03-09 ENCOUNTER — Other Ambulatory Visit

## 2024-03-09 DIAGNOSIS — N926 Irregular menstruation, unspecified: Secondary | ICD-10-CM

## 2024-03-10 DIAGNOSIS — F4389 Other reactions to severe stress: Secondary | ICD-10-CM | POA: Diagnosis not present

## 2024-03-10 DIAGNOSIS — F411 Generalized anxiety disorder: Secondary | ICD-10-CM | POA: Diagnosis not present

## 2024-03-10 DIAGNOSIS — Z79899 Other long term (current) drug therapy: Secondary | ICD-10-CM | POA: Diagnosis not present

## 2024-03-10 DIAGNOSIS — F4312 Post-traumatic stress disorder, chronic: Secondary | ICD-10-CM | POA: Diagnosis not present

## 2024-03-10 DIAGNOSIS — F28 Other psychotic disorder not due to a substance or known physiological condition: Secondary | ICD-10-CM | POA: Diagnosis not present

## 2024-03-10 DIAGNOSIS — F902 Attention-deficit hyperactivity disorder, combined type: Secondary | ICD-10-CM | POA: Diagnosis not present

## 2024-03-10 DIAGNOSIS — F250 Schizoaffective disorder, bipolar type: Secondary | ICD-10-CM | POA: Diagnosis not present

## 2024-03-11 ENCOUNTER — Ambulatory Visit: Payer: Self-pay | Admitting: Obstetrics and Gynecology

## 2024-03-11 LAB — PROLACTIN: Prolactin: 17 ng/mL (ref 4.8–33.4)

## 2024-03-11 LAB — TESTOSTERONE,FREE AND TOTAL
Testosterone, Free: 0.6 pg/mL (ref 0.0–4.2)
Testosterone: 12 ng/dL — ABNORMAL LOW (ref 13–71)

## 2024-03-11 LAB — ESTRADIOL: Estradiol: 80.9 pg/mL

## 2024-03-11 LAB — TSH: TSH: 1.46 u[IU]/mL (ref 0.450–4.500)

## 2024-03-11 LAB — FOLLICLE STIMULATING HORMONE: FSH: 4.9 m[IU]/mL

## 2024-03-14 ENCOUNTER — Telehealth: Payer: Self-pay | Admitting: Psychiatry

## 2024-03-14 NOTE — Telephone Encounter (Signed)
 PT called to cancel her 8/5 follow up appt b/c she is re-establishing care with her previous provider.

## 2024-03-15 DIAGNOSIS — Z6841 Body Mass Index (BMI) 40.0 and over, adult: Secondary | ICD-10-CM | POA: Diagnosis not present

## 2024-03-15 DIAGNOSIS — Z713 Dietary counseling and surveillance: Secondary | ICD-10-CM | POA: Diagnosis not present

## 2024-03-16 ENCOUNTER — Ambulatory Visit (INDEPENDENT_AMBULATORY_CARE_PROVIDER_SITE_OTHER): Admitting: Family Medicine

## 2024-03-16 ENCOUNTER — Encounter (INDEPENDENT_AMBULATORY_CARE_PROVIDER_SITE_OTHER): Payer: Self-pay | Admitting: Family Medicine

## 2024-03-16 DIAGNOSIS — F4389 Other reactions to severe stress: Secondary | ICD-10-CM | POA: Diagnosis not present

## 2024-03-16 DIAGNOSIS — R7303 Prediabetes: Secondary | ICD-10-CM | POA: Diagnosis not present

## 2024-03-16 DIAGNOSIS — E669 Obesity, unspecified: Secondary | ICD-10-CM | POA: Diagnosis not present

## 2024-03-16 DIAGNOSIS — Z6841 Body Mass Index (BMI) 40.0 and over, adult: Secondary | ICD-10-CM

## 2024-03-16 DIAGNOSIS — E559 Vitamin D deficiency, unspecified: Secondary | ICD-10-CM | POA: Diagnosis not present

## 2024-03-16 DIAGNOSIS — F28 Other psychotic disorder not due to a substance or known physiological condition: Secondary | ICD-10-CM | POA: Diagnosis not present

## 2024-03-16 MED ORDER — METFORMIN HCL 500 MG PO TABS: ORAL_TABLET | ORAL | 1 refills | Status: DC

## 2024-03-16 MED ORDER — VITAMIN D (ERGOCALCIFEROL) 1.25 MG (50000 UNIT) PO CAPS
50000.0000 [IU] | ORAL_CAPSULE | ORAL | 1 refills | Status: DC
Start: 2024-03-16 — End: 2024-03-30

## 2024-03-16 NOTE — Progress Notes (Signed)
 Kathy Rios, D.O.  ABFM, ABOM Specializing in Clinical Bariatric Medicine  Office located at: [address] [place], [place]  [address]   Assessment and Plan:   Medications Discontinued During This Encounter  Medication Reason   Vitamin D , Ergocalciferol , (DRISDOL ) 1.25 MG (50000 UNIT) CAPS capsule Reorder   metFORMIN  (GLUCOPHAGE ) 500 MG tablet Reorder    Meds ordered this encounter  Medications   Vitamin D , Ergocalciferol , (DRISDOL ) 1.25 MG (50000 UNIT) CAPS capsule    Sig: Take 1 capsule (50,000 Units total) by mouth every 7 (seven) days.    Dispense:  4 capsule    Refill:  1   metFORMIN  (GLUCOPHAGE ) 500 MG tablet    Sig: 2 po with breakfast and 1 po lunch daily    Dispense:  90 tablet    Refill:  1      FOR THE DISEASE OF OBESITY:  Obesity, Beginning BMI 49.09 BMI 45.0-49.9, adult (HCC) current 49.26 Assessment & Plan: Since last office visit with Dr. Verdon on 03/02/24 patient's muscle mass has decreased by 3 lbs. Fat mass has increased by 5.4 lbs. Total body water has increased by 2.8 lbs.  Body fat % has increased by 1.5%. Counseling done on how various foods will affect these numbers and how to maximize success  Total lbs lost to date: +1 lbs Total weight loss percentage to date: +0.34%   Recommended Dietary Goals Kathy Rios is currently in the action stage of change. As such, her goal is to continue weight management plan.  She has agreed to: continue current plan   Behavioral Intervention We discussed the following today: increasing lean protein intake to established goals, increasing water intake , decreasing eating out or consumption of processed foods, and making healthy choices when eating convenient foods, and continue to practice mindfulness when eating  Additional resources provided today: Handout on Daily Food Journaling Log and Dining Out Guide  Evidence-based interventions for health behavior change were utilized today including the  discussion of self monitoring techniques, problem-solving barriers and SMART goal setting techniques.   Regarding patient's less desirable eating habits and patterns, we employed the technique of small changes.   Pt will specifically work on: Stay within calorie and protein range more often when journaling    Recommended Physical Activity Goals Kathy Rios has been advised to work up to 300-450 minutes of moderate intensity aerobic activity a week and strengthening exercises 2-3 times per week for cardiovascular health, weight loss maintenance and preservation of muscle mass.   She has agreed to: Continue current level of physical activity  and Increase the intensity, frequency or duration of aerobic exercises     Pharmacotherapy We both agreed to: Continue with current nutritional and behavioral strategies and meds aiding in wt loss (metformin ).   ASSOCIATED CONDITIONS ADDRESSED TODAY:  Prediabetes Assessment & Plan: Lab Results  Component Value Date   HGBA1C 5.9 (H) 01/04/2024   HGBA1C 5.9 (H) 06/11/2023   HGBA1C 5.8 10/28/2022    Restarted Metformin  500 mg once daily on 12/23/23. Increased to 500 mg BID on 01/14/24. At her LOV with Rockie Dalton, NP, she was advised to take her 2nd dose at lunch to help with evening cravings. Currently on Metformin  500 BID. Good compliance and tolerance. Endorses increased cravings during the daytime. No adverse SE.   Discussed option of increasing Metformin  for better control of daytime cravings. Shared decision making to INCREASE Metformin  to 1,000 with breakfast and 500 mg at lunch. Risks/benefits reviewed. Encouraged pt  to continue eating on plan to avoid potential SE with increased dose. Pt verbalized understanding/agreement to this plan. Will continue monitoring alongside PCP.     Vitamin D  deficiency Assessment & Plan: Lab Results  Component Value Date   VD25OH 34.8 01/04/2024   VD25OH 33.1 11/09/2023   VD25OH 37.6 07/06/2023   Taking ERGO  50K units once weekly. Good compliance and tolerance. No adverse SE or acute concerns.   Reviewed ideal vit D levels of 50-70. Continue supplementation. Will refill ERGO today with no dose changes. Recheck levels periodically     Follow up:   Return in about 2 weeks (around 03/30/2024) for f/u on 03/30/2024 at 12:20 PM with Dr. Verdon. She was informed of the importance of frequent follow up visits to maximize her success with intensive lifestyle modifications for her multiple health conditions.  Subjective:   Chief complaint: Obesity Kathy Rios is here to discuss her progress with her obesity treatment plan. She is journaling 1500-1600 calories with 110+g of protein using modified Cat 2 MP with 6 ounces at lunch as a guide and states she is following her eating plan approximately 65% of the time. She states she is walking 30-60 minutes 3 days per week.  Interval History:  Kathy Rios is here for a follow up office visit. Since last OV with Dr. Verdon on 02/27/24, she is up 2 lbs. She has done very well with journaling her calorie and protein intake daily. On average, she is hitting 1500-1600 calories. She reports 4 days she went over her goal in calories, but met her protein goal. She had 2 days she went over her calorie goal and under her protein goal -- 7/27 & 7/23. Sees a nutritionist on Tuesdays. For breakfast, she eats 2 eggs, a half cup of egg whites, and either 3 cups of spinach or 2.5 cups of kale. She will add 1/4 mozz cheese to the scrambled eggs if she does not eat it w a yogurt. She reports eating off plan after a f/u with her gynecologist, stating she was fasting and was at the clinic for a long time and wanted something quick to eat.   Pharmacotherapy that aid with weight loss: She is currently taking Metformin  500 mg BID.    Review of Systems:  Pertinent positives were addressed with patient today.  Reviewed by clinician on day of visit: allergies, medications, problem list,  medical history, surgical history, family history, social history, and previous encounter notes.  Weight Summary and Biometrics   Weight Lost Since Last Visit: 0lb  Weight Gained Since Last Visit: 2lb    Vitals Temp: 97.9 F (36.6 C) BP: 133/79 Pulse Rate: 98 SpO2: 98 %   Anthropometric Measurements Height: 5' 5 (1.651 m) Weight: 296 lb (134.3 kg) BMI (Calculated): 49.26 Weight at Last Visit: 294lb Weight Lost Since Last Visit: 0lb Weight Gained Since Last Visit: 2lb Starting Weight: 295lb Total Weight Loss (lbs): 0 lb (0 kg) Peak Weight: 337lb   Body Composition  Body Fat %: 51.3 % Fat Mass (lbs): 152 lbs Muscle Mass (lbs): 137.2 lbs Total Body Water (lbs): 96.8 lbs Visceral Fat Rating : 15   Other Clinical Data Fasting: No Labs: no Today's Visit #: 8 Starting Date: 11/09/23 Comments: Cat 2    Objective:   PHYSICAL EXAM: Blood pressure 133/79, pulse 98, temperature 97.9 F (36.6 C), height 5' 5 (1.651 m), weight 296 lb (134.3 kg), SpO2 98%. Body mass index is 49.26 kg/m.  General: she is overweight, cooperative  and in no acute distress. PSYCH: Has normal mood, affect and thought process.   HEENT: EOMI, sclerae are anicteric. Lungs: Normal breathing effort, no conversational dyspnea. Extremities: Moves * 4 Neurologic: A and O * 3, good insight  DIAGNOSTIC DATA REVIEWED: BMET    Component Value Date/Time   NA 140 01/04/2024 1048   K 4.6 01/04/2024 1048   CL 104 01/04/2024 1048   CO2 20 01/04/2024 1048   GLUCOSE 95 01/04/2024 1048   GLUCOSE 106 (H) 09/23/2021 1824   BUN 13 01/04/2024 1048   CREATININE 0.70 01/04/2024 1048   CALCIUM 9.7 01/04/2024 1048   GFRNONAA >60 09/23/2021 1824   GFRAA 146 08/08/2020 1453   Lab Results  Component Value Date   HGBA1C 5.9 (H) 01/04/2024   HGBA1C 6.0 (H) 05/23/2021   No results found for: INSULIN  Lab Results  Component Value Date   TSH 1.460 03/09/2024   CBC    Component Value Date/Time    WBC 8.1 01/13/2024 0930   RBC 4.91 01/13/2024 0930   HGB 10.6 (L) 01/13/2024 0930   HGB 10.8 (L) 01/04/2024 1048   HCT 34.6 (L) 01/13/2024 0930   HCT 36.2 01/04/2024 1048   PLT 347 01/13/2024 0930   PLT 414 01/04/2024 1048   MCV 70.5 (L) 01/13/2024 0930   MCV 71 (L) 01/04/2024 1048   MCH 21.6 (L) 01/13/2024 0930   MCHC 30.6 01/13/2024 0930   RDW 17.8 (H) 01/13/2024 0930   RDW 16.1 (H) 01/04/2024 1048   Iron Studies    Component Value Date/Time   IRON 46 01/13/2024 0930   IRON 36 01/04/2024 1048   TIBC 328 01/13/2024 0930   TIBC 281 01/04/2024 1048   FERRITIN 106 01/13/2024 0930   FERRITIN 200 (H) 01/04/2024 1048   IRONPCTSAT 14 01/13/2024 0930   IRONPCTSAT 13 (L) 01/04/2024 1048   Lipid Panel     Component Value Date/Time   CHOL 236 (H) 01/04/2024 1048   TRIG 94 01/04/2024 1048   HDL 56 01/04/2024 1048   CHOLHDL 4.2 01/04/2024 1048   LDLCALC 163 (H) 01/04/2024 1048   Hepatic Function Panel     Component Value Date/Time   PROT 7.7 01/04/2024 1048   ALBUMIN 4.1 01/04/2024 1048   AST 19 01/04/2024 1048   ALT 15 01/04/2024 1048   ALKPHOS 124 (H) 01/04/2024 1048   BILITOT <0.2 01/04/2024 1048      Component Value Date/Time   TSH 1.460 03/09/2024 1217   Nutritional Lab Results  Component Value Date   VD25OH 34.8 01/04/2024   VD25OH 33.1 11/09/2023   VD25OH 37.6 07/06/2023    Attestations:   I, Vernell Forest, acting as a Stage manager for Kathy Jenkins, DO., have compiled all relevant documentation for today's office visit on behalf of Kathy Jenkins, DO, while in the presence of [employer], DO.  Reviewed by clinician on day of visit: allergies, medications, problem list, medical history, surgical history, family history, social history, and previous encounter notes pertinent to patient's obesity diagnosis.  I have spent 42 minutes in the care of the patient today including 32 minutes face-to-face assessing and reviewing listed medical problems above  as outlined in office visit note and providing nutritional and behavioral counseling as outlined in obesity care plan.   I have reviewed the above documentation for accuracy and completeness, and I agree with the above. Kathy Rios, D.O.  The 21st Century Cures Act was signed into law in 2016 which includes the topic of electronic  health records.  This provides immediate access to information in MyChart.  This includes consultation notes, operative notes, office notes, lab results and pathology reports.  If you have any questions about what you read please let us  know at your next visit so we can discuss your concerns and take corrective action if need be.  We are right here with you.

## 2024-03-18 ENCOUNTER — Other Ambulatory Visit (HOSPITAL_COMMUNITY)
Admission: RE | Admit: 2024-03-18 | Discharge: 2024-03-18 | Disposition: A | Discharge status: Home / Self Care | Source: Ambulatory Visit | Attending: Obstetrics and Gynecology | Admitting: Obstetrics and Gynecology

## 2024-03-18 ENCOUNTER — Ambulatory Visit: Payer: Self-pay | Admitting: Obstetrics and Gynecology

## 2024-03-18 VITALS — BP 109/62 | HR 104 | Ht 65.0 in | Wt 296.0 lb

## 2024-03-18 DIAGNOSIS — N926 Irregular menstruation, unspecified: Secondary | ICD-10-CM

## 2024-03-18 DIAGNOSIS — Z124 Encounter for screening for malignant neoplasm of cervix: Secondary | ICD-10-CM

## 2024-03-18 NOTE — Progress Notes (Signed)
   RETURN GYNECOLOGY VISIT  Subjective:  Kathy Rios is a 23 y.o. G0P0000 with LMP 03/15/24 presenting for discussion of PMDD/irregular periods and cervical cancer screening  Started slynd  in March (~5 months ago) for PMDD/irregular & heavy periods, not a candidate for estrogen containing methods due to HTN. She desired PCOS eval due to difficulty losing weight so she stopped slynd  in May. We did PCOS testing 1 and 2 months after discontinuation that was normal.   Today, she reports that her cycles are regular and not bothering her. Her moods have improved, too. She would like to stay off birth control. She is not currently sexually active.   Objective:   Vitals:   03/18/24 1041  BP: 109/62  Pulse: (!) 104  Weight: 296 lb (134.3 kg)  Height: 5' 5 (1.651 m)    General:  Alert, oriented and cooperative. Patient is in no acute distress.  Skin: Skin is warm and dry. No rash noted.   Cardiovascular: Normal heart rate noted  Respiratory: Normal respiratory effort, no problems with respiration noted  Abdomen: Soft, non-tender, non-distended   Pelvic: NEFG. Cervix visually normal  Exam performed in the presence of a chaperone  Assessment and Plan:  Kathy Rios is a 23 y.o. with PMS/PMDD and menstrual concerns  Menstrual problem - Will stay off hormonal contraception for now and track symptoms/cycles - condoms for contraception - discussed red flag symptoms re: bleeding (see AVS) - follow up with annual in 1 year or sooner as needed  Cervical cancer screening -     Cytology - [facility]( [place])  Total encounter time: 30 minutes  Future Appointments  Date Time Provider Department Center  03/30/2024 12:20 PM Verdon Louann BIRCH, MD [facility] None  04/13/2024  9:00 AM Verdon Louann D, MD [facility] None  04/28/2024 10:00 AM Verdon Louann BIRCH, MD [facility] None  07/06/2024 10:20 AM Bevely Doffing, FNP [facility]  10/26/2024 11:00 AM Iva Marty Saltness, MD [facility] None  01/04/2025  10:00 AM [facility] None  01/11/2025 10:30 AM Lamon Pleasant HERO, PA-C [facility] None    Kieth JAYSON Carolin, MD

## 2024-03-18 NOTE — Patient Instructions (Signed)
 Please call if:  - cycles are < 21 days apart  - periods last 8+ days - you have bleeding between periods - you are soaking pads in less than 1-2 hours with your period

## 2024-03-18 NOTE — Progress Notes (Signed)
 Pt unsure if she wants to start [facility]. Considering non hormonal BC methods.

## 2024-03-22 ENCOUNTER — Ambulatory Visit: Admitting: Psychiatry

## 2024-03-22 DIAGNOSIS — Z6841 Body Mass Index (BMI) 40.0 and over, adult: Secondary | ICD-10-CM | POA: Diagnosis not present

## 2024-03-22 DIAGNOSIS — Z713 Dietary counseling and surveillance: Secondary | ICD-10-CM | POA: Diagnosis not present

## 2024-03-23 LAB — CYTOLOGY - PAP
Chlamydia: NEGATIVE
Comment: NEGATIVE
Comment: NORMAL
Diagnosis: NEGATIVE
Neisseria Gonorrhea: NEGATIVE

## 2024-03-24 ENCOUNTER — Ambulatory Visit: Payer: Self-pay | Admitting: Obstetrics and Gynecology

## 2024-03-24 DIAGNOSIS — F28 Other psychotic disorder not due to a substance or known physiological condition: Secondary | ICD-10-CM | POA: Diagnosis not present

## 2024-03-24 DIAGNOSIS — F4389 Other reactions to severe stress: Secondary | ICD-10-CM | POA: Diagnosis not present

## 2024-03-29 DIAGNOSIS — Z6841 Body Mass Index (BMI) 40.0 and over, adult: Secondary | ICD-10-CM | POA: Diagnosis not present

## 2024-03-29 DIAGNOSIS — Z713 Dietary counseling and surveillance: Secondary | ICD-10-CM | POA: Diagnosis not present

## 2024-03-30 ENCOUNTER — Encounter (INDEPENDENT_AMBULATORY_CARE_PROVIDER_SITE_OTHER): Payer: Self-pay | Admitting: Family Medicine

## 2024-03-30 ENCOUNTER — Ambulatory Visit (INDEPENDENT_AMBULATORY_CARE_PROVIDER_SITE_OTHER): Admitting: Family Medicine

## 2024-03-30 VITALS — BP 114/79 | HR 91 | Temp 98.0°F | Ht 65.0 in | Wt 299.0 lb

## 2024-03-30 DIAGNOSIS — F5089 Other specified eating disorder: Secondary | ICD-10-CM | POA: Diagnosis not present

## 2024-03-30 DIAGNOSIS — Z6841 Body Mass Index (BMI) 40.0 and over, adult: Secondary | ICD-10-CM | POA: Diagnosis not present

## 2024-03-30 DIAGNOSIS — R7303 Prediabetes: Secondary | ICD-10-CM | POA: Diagnosis not present

## 2024-03-30 DIAGNOSIS — E669 Obesity, unspecified: Secondary | ICD-10-CM

## 2024-03-30 DIAGNOSIS — F3289 Other specified depressive episodes: Secondary | ICD-10-CM

## 2024-03-30 DIAGNOSIS — E559 Vitamin D deficiency, unspecified: Secondary | ICD-10-CM | POA: Diagnosis not present

## 2024-03-30 MED ORDER — TOPIRAMATE 50 MG PO TABS: 50.0000 mg | ORAL_TABLET | Freq: Every day | ORAL | 0 refills | Status: DC

## 2024-03-30 MED ORDER — METFORMIN HCL 500 MG PO TABS: ORAL_TABLET | ORAL | 1 refills | Status: DC

## 2024-03-30 MED ORDER — VITAMIN D (ERGOCALCIFEROL) 1.25 MG (50000 UNIT) PO CAPS
50000.0000 [IU] | ORAL_CAPSULE | ORAL | 1 refills | Status: DC
Start: 2024-03-30 — End: 2024-04-28

## 2024-03-30 NOTE — Progress Notes (Signed)
 Office: [phone]  /  Fax: [phone]  WEIGHT SUMMARY AND BIOMETRICS  Anthropometric Measurements Height: 5' 5 (1.651 m) Weight: 299 lb (135.6 kg) BMI (Calculated): 49.76 Weight at Last Visit: 296 lb Weight Lost Since Last Visit: 0 Weight Gained Since Last Visit: 3 lb Starting Weight: 295 lb Total Weight Loss (lbs): 0 lb (0 kg) Peak Weight: 337 lb   Body Composition  Body Fat %: 51.9 % Fat Mass (lbs): 155.4 lbs Muscle Mass (lbs): 137 lbs Total Body Water (lbs): 99.4 lbs Visceral Fat Rating : 16   Other Clinical Data Fasting: no Labs: no Today's Visit #: 9 Starting Date: 11/09/23 Comments: cat 2    Chief Complaint: OBESITY    History of Present Illness Kathy Rios is a 23 year old female with obesity and prediabetes who presents to discuss her obesity treatment plan and assess her progress.  She is adhering to her prescribed category two eating plan about fifty percent of the time and is attempting to incorporate more whole foods and proteins into her diet. Despite these efforts, she has gained three pounds in the last two weeks. She is getting seven to nine hours of sleep per night but is skipping meals and not hydrating adequately. She experiences cravings at night and sometimes during the day, and her dietitian suggested this may be related to waiting five to six hours between meals.  She has been working with a dietitian for about a month, who advised her to eat three meals a day at regular intervals to manage her insulin  resistance and prediabetes. The dietitian suggested increasing her carbohydrate intake slightly from 90 to 130 grams per day while maintaining a protein goal of 110 grams and monitoring saturated fats. She is incorporating lean meats and reduced-fat dairy into her diet, enjoying products like fat-free Fairlife milk and Dannon [employer] and Fit Greek yogurt.  She has a history of prediabetes and insulin  resistance and is currently  taking metformin , with a recent increase in dosage two weeks ago. She has not noticed changes in her cravings since the dosage increase. She mentions a previous medication, Zetban, which was effective but is not covered by her insurance. She is cautious about medications that may affect her mood due to her history of schizoaffective disorder, recalling a previous issue with prednisone .  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 She is currently abstinent and uses condoms for birth control. No history of kidney stones or seizures.      PHYSICAL EXAM:  Blood pressure 114/79, pulse 91, temperature 98 F (36.7 C), height 5' 5 (1.651 m), weight 299 lb (135.6 kg), SpO2 99%. Body mass index is 49.76 kg/m.  DIAGNOSTIC DATA REVIEWED:  BMET    Component Value Date/Time   NA 140 01/04/2024 1048   K 4.6 01/04/2024 1048   CL 104 01/04/2024 1048   CO2 20 01/04/2024 1048   GLUCOSE 95 01/04/2024 1048   GLUCOSE 106 (H) 09/23/2021 1824   BUN 13 01/04/2024 1048   CREATININE 0.70 01/04/2024 1048   CALCIUM 9.7 01/04/2024 1048   GFRNONAA >60 09/23/2021 1824   GFRAA 146 08/08/2020 1453   Lab Results  Component Value Date   HGBA1C 5.9 (H) 01/04/2024   HGBA1C 6.0 (H) 05/23/2021   No results found for: INSULIN  Lab Results  Component Value Date  TSH 1.460 03/09/2024   CBC    Component Value Date/Time   WBC 8.1 01/13/2024 0930   RBC 4.91 01/13/2024 0930   HGB 10.6 (L) 01/13/2024 0930   HGB 10.8 (L) 01/04/2024 1048   HCT 34.6 (L) 01/13/2024 0930   HCT 36.2 01/04/2024 1048   PLT 347 01/13/2024 0930   PLT 414 01/04/2024 1048   MCV 70.5 (L) 01/13/2024 0930   MCV 71 (L) 01/04/2024 1048   MCH 21.6 (L) 01/13/2024 0930   MCHC 30.6 01/13/2024 0930   RDW 17.8 (H) 01/13/2024 0930   RDW 16.1 (H)  01/04/2024 1048   Iron Studies    Component Value Date/Time   IRON 46 01/13/2024 0930   IRON 36 01/04/2024 1048   TIBC 328 01/13/2024 0930   TIBC 281 01/04/2024 1048   FERRITIN 106 01/13/2024 0930   FERRITIN 200 (H) 01/04/2024 1048   IRONPCTSAT 14 01/13/2024 0930   IRONPCTSAT 13 (L) 01/04/2024 1048   Lipid Panel     Component Value Date/Time   CHOL 236 (H) 01/04/2024 1048   TRIG 94 01/04/2024 1048   HDL 56 01/04/2024 1048   CHOLHDL 4.2 01/04/2024 1048   LDLCALC 163 (H) 01/04/2024 1048   Hepatic Function Panel     Component Value Date/Time   PROT 7.7 01/04/2024 1048   ALBUMIN 4.1 01/04/2024 1048   AST 19 01/04/2024 1048   ALT 15 01/04/2024 1048   ALKPHOS 124 (H) 01/04/2024 1048   BILITOT <0.2 01/04/2024 1048      Component Value Date/Time   TSH 1.460 03/09/2024 1217   Nutritional Lab Results  Component Value Date   VD25OH 34.8 01/04/2024   VD25OH 33.1 11/09/2023   VD25OH 37.6 07/06/2023     Assessment and Plan Assessment & Plan Vitamin D  deficiency Vitamin D  deficiency is managed with supplementation. - Send in prescription for vitamin D  supplementation.  Obesity Obesity management is ongoing. She has gained three pounds in the last two weeks. She follows her prescribed category two eating plan about 50% of the time, works on eating more whole foods and proteins, gets adequate sleep, but skips meals and does not hydrate adequately. She is working with a dietitian to improve her eating habits. - Continue category two eating plan with guidance from dietitian. - Encourage regular meal timing to prevent skipping meals. - Encourage adequate hydration.  Emotional eating behaviors Emotional eating behaviors are addressed with dietary changes and potential medication adjustments. She experiences cravings and has been advised to eat more regularly to manage insulin  resistance and hunger. Topiramate  is considered to help with emotional eating and cravings. Informed  consent was discussed regarding topiramate , including potential side effects such as tingling in fingertips, taste disturbances, and the need for adequate hydration to prevent kidney stones. - Prescribe topiramate  50 mg per day, with the option to start at 25 mg for the first week. - Discuss topiramate  with psychiatrist at her visit tomorrow. - Monitor for side effects such as tingling in fingertips, taste disturbances, and ensure adequate hydration to prevent kidney stones.  Prediabetes Prediabetes is managed with dietary changes and metformin . She is currently taking metformin  without gastrointestinal side effects. The dose may be increased to three times daily with meals to help manage physiologic hunger. Metformin  is effective for physiologic hunger but less so for neurologic hunger. - Increase metformin  to three times daily with meals.     She was informed of the importance of frequent follow up visits to maximize her success with  intensive lifestyle modifications for her multiple health conditions.    Louann Penton, MD

## 2024-03-31 DIAGNOSIS — F28 Other psychotic disorder not due to a substance or known physiological condition: Secondary | ICD-10-CM | POA: Diagnosis not present

## 2024-03-31 DIAGNOSIS — F4312 Post-traumatic stress disorder, chronic: Secondary | ICD-10-CM | POA: Diagnosis not present

## 2024-03-31 DIAGNOSIS — E66813 Obesity, class 3: Secondary | ICD-10-CM | POA: Diagnosis not present

## 2024-03-31 DIAGNOSIS — F4389 Other reactions to severe stress: Secondary | ICD-10-CM | POA: Diagnosis not present

## 2024-03-31 DIAGNOSIS — F411 Generalized anxiety disorder: Secondary | ICD-10-CM | POA: Diagnosis not present

## 2024-03-31 DIAGNOSIS — F250 Schizoaffective disorder, bipolar type: Secondary | ICD-10-CM | POA: Diagnosis not present

## 2024-03-31 DIAGNOSIS — F902 Attention-deficit hyperactivity disorder, combined type: Secondary | ICD-10-CM | POA: Diagnosis not present

## 2024-03-31 DIAGNOSIS — N943 Premenstrual tension syndrome: Secondary | ICD-10-CM | POA: Diagnosis not present

## 2024-04-04 ENCOUNTER — Encounter (INDEPENDENT_AMBULATORY_CARE_PROVIDER_SITE_OTHER): Payer: Self-pay | Admitting: Family Medicine

## 2024-04-06 DIAGNOSIS — Z6841 Body Mass Index (BMI) 40.0 and over, adult: Secondary | ICD-10-CM | POA: Diagnosis not present

## 2024-04-06 DIAGNOSIS — Z713 Dietary counseling and surveillance: Secondary | ICD-10-CM | POA: Diagnosis not present

## 2024-04-07 ENCOUNTER — Ambulatory Visit (INDEPENDENT_AMBULATORY_CARE_PROVIDER_SITE_OTHER): Admitting: Family Medicine

## 2024-04-07 DIAGNOSIS — F28 Other psychotic disorder not due to a substance or known physiological condition: Secondary | ICD-10-CM | POA: Diagnosis not present

## 2024-04-07 DIAGNOSIS — F4389 Other reactions to severe stress: Secondary | ICD-10-CM | POA: Diagnosis not present

## 2024-04-10 ENCOUNTER — Encounter: Payer: Self-pay | Admitting: Obstetrics and Gynecology

## 2024-04-12 DIAGNOSIS — Z713 Dietary counseling and surveillance: Secondary | ICD-10-CM | POA: Diagnosis not present

## 2024-04-12 DIAGNOSIS — Z6841 Body Mass Index (BMI) 40.0 and over, adult: Secondary | ICD-10-CM | POA: Diagnosis not present

## 2024-04-13 ENCOUNTER — Ambulatory Visit (INDEPENDENT_AMBULATORY_CARE_PROVIDER_SITE_OTHER): Admitting: Family Medicine

## 2024-04-13 ENCOUNTER — Encounter (INDEPENDENT_AMBULATORY_CARE_PROVIDER_SITE_OTHER): Payer: Self-pay | Admitting: Family Medicine

## 2024-04-13 VITALS — BP 113/73 | HR 90 | Temp 97.7°F | Ht 65.0 in | Wt 296.0 lb

## 2024-04-13 DIAGNOSIS — D509 Iron deficiency anemia, unspecified: Secondary | ICD-10-CM

## 2024-04-13 DIAGNOSIS — E559 Vitamin D deficiency, unspecified: Secondary | ICD-10-CM | POA: Diagnosis not present

## 2024-04-13 DIAGNOSIS — D560 Alpha thalassemia: Secondary | ICD-10-CM | POA: Diagnosis not present

## 2024-04-13 DIAGNOSIS — Z6841 Body Mass Index (BMI) 40.0 and over, adult: Secondary | ICD-10-CM

## 2024-04-13 DIAGNOSIS — E611 Iron deficiency: Secondary | ICD-10-CM | POA: Diagnosis not present

## 2024-04-13 DIAGNOSIS — E669 Obesity, unspecified: Secondary | ICD-10-CM | POA: Diagnosis not present

## 2024-04-13 DIAGNOSIS — R7303 Prediabetes: Secondary | ICD-10-CM

## 2024-04-13 DIAGNOSIS — I10 Essential (primary) hypertension: Secondary | ICD-10-CM | POA: Diagnosis not present

## 2024-04-13 NOTE — Progress Notes (Signed)
 Office: [phone]  /  Fax: [phone]  WEIGHT SUMMARY AND BIOMETRICS  Anthropometric Measurements Height: 5' 5 (1.651 m) Weight: 296 lb (134.3 kg) BMI (Calculated): 49.26 Weight at Last Visit: 296 lb Weight Lost Since Last Visit: 3 lb Weight Gained Since Last Visit: 0 Starting Weight: 295 lb Total Weight Loss (lbs): 0 lb (0 kg) Peak Weight: 337 lb   Body Composition  Body Fat %: 50.2 % Fat Mass (lbs): 148.8 lbs Muscle Mass (lbs): 140.2 lbs Total Body Water (lbs): 94.4 lbs Visceral Fat Rating : 15   Other Clinical Data Fasting: yes Labs: no Today's Visit #: 10 Starting Date: 11/09/23 Comments: Cat 2    Chief Complaint: OBESITY    History of Present Illness Kathy Rios is a 23 year old female who presents for obesity treatment and progress assessment.  She has been adhering to the category two eating plan about fifty percent of the time, resulting in a weight loss of three pounds over the past two weeks. She is not currently engaging in any exercise regimen but is collaborating with a dietitian who has adjusted her calorie intake to 1900 calories per day based on her basal metabolic rate and activity level. Her dietitian recommended a protein intake goal of at least 110 grams per day.  She has a history of ADHD and schizophrenia. There was a consideration of initiating topiramate  for emotional eating; however, her psychiatrist advised against it due to potential cognitive effects. She is currently taking metformin  for prediabetes, with a regimen of two pills in the morning after breakfast and one in the afternoon with lunch. She also has a history of PTSD and is on clonidine , which aids in managing nightmares and sleep disturbances, though she reports low blood pressure readings at home, with the most recent being 94/52 mmHg. Additionally, she is on olmesartan  for blood pressure management.  She has a history of alpha thalassemia trait and mild iron  deficiency, for which she is taking iron supplements. Her last hemoglobin levels were low, and she is due for a recheck. She also notes a recent light menstrual cycle, which is atypical for her, and has been on a lower carbohydrate diet for an extended period, which her dietitian suggested might influence her cycle.  She has previously participated in the [facility] scholarship program for exercise based on her income and is in the process of renewing her membership.      PHYSICAL EXAM:  Blood pressure 113/73, pulse 90, temperature 97.7 F (36.5 C), height 5' 5 (1.651 m), weight 296 lb (134.3 kg), SpO2 98%. Body mass index is 49.26 kg/m.  DIAGNOSTIC DATA REVIEWED:  BMET    Component Value Date/Time   NA 140 01/04/2024 1048   K 4.6 01/04/2024 1048   CL 104 01/04/2024 1048   CO2 20 01/04/2024 1048   GLUCOSE 95 01/04/2024 1048   GLUCOSE 106 (H) 09/23/2021 1824   BUN 13 01/04/2024 1048   CREATININE 0.70 01/04/2024 1048   CALCIUM 9.7 01/04/2024 1048   GFRNONAA >60 09/23/2021 1824   GFRAA 146 08/08/2020 1453   Lab Results  Component Value Date   HGBA1C 5.9 (H) 01/04/2024   HGBA1C 6.0 (H) 05/23/2021   No results found for: INSULIN  Lab Results  Component Value Date   TSH 1.460 03/09/2024   CBC    Component Value Date/Time   WBC 8.1 01/13/2024 0930   RBC 4.91 01/13/2024 0930   HGB 10.6 (L) 01/13/2024 0930   HGB 10.8 (L)  01/04/2024 1048   HCT 34.6 (L) 01/13/2024 0930   HCT 36.2 01/04/2024 1048   PLT 347 01/13/2024 0930   PLT 414 01/04/2024 1048   MCV 70.5 (L) 01/13/2024 0930   MCV 71 (L) 01/04/2024 1048   MCH 21.6 (L) 01/13/2024 0930   MCHC 30.6 01/13/2024 0930   RDW 17.8 (H) 01/13/2024 0930   RDW 16.1 (H) 01/04/2024 1048   Iron Studies    Component Value Date/Time   IRON 46 01/13/2024 0930   IRON 36 01/04/2024 1048   TIBC 328 01/13/2024 0930   TIBC 281 01/04/2024 1048   FERRITIN 106 01/13/2024 0930   FERRITIN 200 (H) 01/04/2024 1048   IRONPCTSAT 14 01/13/2024  0930   IRONPCTSAT 13 (L) 01/04/2024 1048   Lipid Panel     Component Value Date/Time   CHOL 236 (H) 01/04/2024 1048   TRIG 94 01/04/2024 1048   HDL 56 01/04/2024 1048   CHOLHDL 4.2 01/04/2024 1048   LDLCALC 163 (H) 01/04/2024 1048   Hepatic Function Panel     Component Value Date/Time   PROT 7.7 01/04/2024 1048   ALBUMIN 4.1 01/04/2024 1048   AST 19 01/04/2024 1048   ALT 15 01/04/2024 1048   ALKPHOS 124 (H) 01/04/2024 1048   BILITOT <0.2 01/04/2024 1048      Component Value Date/Time   TSH 1.460 03/09/2024 1217   Nutritional Lab Results  Component Value Date   VD25OH 34.8 01/04/2024   VD25OH 33.1 11/09/2023   VD25OH 37.6 07/06/2023     Assessment and Plan Assessment & Plan Obesity with associated weight management and dietary counseling Obesity management with dietary counseling. Following category two eating plan 50% of the time. Recent adjustment to caloric intake to 1900 calories based on BMR and activity level, resulting in better adherence and weight loss. Lost 3 pounds in the last two weeks. No current exercise regimen, but plans to renew [employer] Discussed potential benefits of chair yoga as an interim exercise option. Protein intake goal of at least 110 grams per day remains unchanged. - Continue current dietary plan with caloric intake between 1500 and 1900 calories per day. - Encourage protein intake of at least 110 grams per day. - Recommend chair yoga as an interim exercise option. - Encourage renewal of [employer] membership for formal exercise. - Schedule follow-up appointment in September and October.  Prediabetes  Prediabetes managed with metformin , currently taking three pills per day. Discussed different dosing schedules and their impact on medication coverage throughout the day. Current dosing schedule is two pills in the morning and one at lunch, which aligns with her daytime cravings. No immediate need to change dosing schedule. - Continue current  metformin  dosing schedule of two pills in the morning and one at lunch. - Order labs to check A1c and insulin  levels. - Continue diet, exercise and weight loss as discussed today as an important part of the treatment plan  Hypertension on olmesartan  and clonidine  with monitoring for hypotension Hypertension managed with olmesartan  and clonidine . Home blood pressure readings occasionally low, with a recent reading of 94/52. Discussed potential need to adjust clonidine  if low readings persist, especially as weight loss continues. Clonidine  is also used for PTSD-related nightmares. Olmesartan  is weight neutral and preferred for long-term management. - Monitor blood pressure regularly at home. - Consider adjusting clonidine  if low blood pressure symptoms occur. - Continue olmesartan  as prescribed. 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 Currently taking iron supplements for mild iron deficiency. Previous labs indicated anemia, and follow-up labs are planned to assess current status. - Order labs to assess iron levels and anemia status.  Vit D deficiency - Check labs and follow  I have personally spent 42 minutes total time today in preparation, patient care, and documentation for this visit, including the following: review of clinical lab tests; review of medical tests/procedures/services and nutritional counseling     She was informed of the importance of frequent follow up visits to maximize her success with intensive lifestyle modifications for her multiple health conditions.    Louann Penton, MD

## 2024-04-14 DIAGNOSIS — F4389 Other reactions to severe stress: Secondary | ICD-10-CM | POA: Diagnosis not present

## 2024-04-14 DIAGNOSIS — F28 Other psychotic disorder not due to a substance or known physiological condition: Secondary | ICD-10-CM | POA: Diagnosis not present

## 2024-04-14 LAB — CMP14+EGFR
ALT: 9 IU/L (ref 0–32)
AST: 11 IU/L (ref 0–40)
Albumin: 4.4 g/dL (ref 4.0–5.0)
Alkaline Phosphatase: 116 IU/L (ref 44–121)
BUN/Creatinine Ratio: 24 — ABNORMAL HIGH (ref 9–23)
BUN: 17 mg/dL (ref 6–20)
Bilirubin Total: 0.3 mg/dL (ref 0.0–1.2)
CO2: 20 mmol/L (ref 20–29)
Calcium: 10.1 mg/dL (ref 8.7–10.2)
Chloride: 101 mmol/L (ref 96–106)
Creatinine, Ser: 0.71 mg/dL (ref 0.57–1.00)
Globulin, Total: 3.8 g/dL (ref 1.5–4.5)
Glucose: 88 mg/dL (ref 70–99)
Potassium: 5 mmol/L (ref 3.5–5.2)
Sodium: 136 mmol/L (ref 134–144)
Total Protein: 8.2 g/dL (ref 6.0–8.5)
eGFR: 122 mL/min/1.73 (ref >59)

## 2024-04-14 LAB — CBC WITH DIFFERENTIAL/PLATELET
Basophils Absolute: 0.1 x10E3/uL (ref 0.0–0.2)
Basos: 1 %
EOS (ABSOLUTE): 0.1 x10E3/uL (ref 0.0–0.4)
Eos: 1 %
Hematocrit: 37.6 % (ref 34.0–46.6)
Hemoglobin: 11.2 g/dL (ref 11.1–15.9)
Immature Grans (Abs): 0 x10E3/uL (ref 0.0–0.1)
Immature Granulocytes: 0 %
Lymphocytes Absolute: 2.8 x10E3/uL (ref 0.7–3.1)
Lymphs: 39 %
MCH: 20.9 pg — ABNORMAL LOW (ref 26.6–33.0)
MCHC: 29.8 g/dL — ABNORMAL LOW (ref 31.5–35.7)
MCV: 70 fL — ABNORMAL LOW (ref 79–97)
Monocytes Absolute: 0.6 x10E3/uL (ref 0.1–0.9)
Monocytes: 8 %
Neutrophils Absolute: 3.8 x10E3/uL (ref 1.4–7.0)
Neutrophils: 51 %
Platelets: 420 x10E3/uL (ref 150–450)
RBC: 5.35 x10E6/uL — ABNORMAL HIGH (ref 3.77–5.28)
RDW: 15.9 % — ABNORMAL HIGH (ref 11.7–15.4)
WBC: 7.3 x10E3/uL (ref 3.4–10.8)

## 2024-04-14 LAB — VITAMIN D 25 HYDROXY (VIT D DEFICIENCY, FRACTURES): Vit D, 25-Hydroxy: 31.3 ng/mL (ref 30.0–100.0)

## 2024-04-14 LAB — LIPID PANEL WITH LDL/HDL RATIO
Cholesterol, Total: 245 mg/dL — ABNORMAL HIGH (ref 100–199)
HDL: 65 mg/dL (ref >39)
LDL Chol Calc (NIH): 168 mg/dL — ABNORMAL HIGH (ref 0–99)
LDL/HDL Ratio: 2.6 ratio (ref 0.0–3.2)
Triglycerides: 71 mg/dL (ref 0–149)
VLDL Cholesterol Cal: 12 mg/dL (ref 5–40)

## 2024-04-14 LAB — INSULIN, RANDOM: INSULIN: 29.8 u[IU]/mL — ABNORMAL HIGH (ref 2.6–24.9)

## 2024-04-14 LAB — HEMOGLOBIN A1C
Est. average glucose Bld gHb Est-mCnc: 117 mg/dL
Hgb A1c MFr Bld: 5.7 % — ABNORMAL HIGH (ref 4.8–5.6)

## 2024-04-14 NOTE — Addendum Note (Signed)
 Addended by: LAFE BAKER CROME on: 04/14/2024 07:39 AM   Modules accepted: Orders

## 2024-04-26 ENCOUNTER — Encounter (INDEPENDENT_AMBULATORY_CARE_PROVIDER_SITE_OTHER): Payer: Self-pay

## 2024-04-26 ENCOUNTER — Ambulatory Visit: Admitting: Certified Nurse Midwife

## 2024-04-26 ENCOUNTER — Telehealth (INDEPENDENT_AMBULATORY_CARE_PROVIDER_SITE_OTHER): Payer: Self-pay

## 2024-04-26 DIAGNOSIS — Z3009 Encounter for other general counseling and advice on contraception: Secondary | ICD-10-CM

## 2024-04-26 DIAGNOSIS — Z6841 Body Mass Index (BMI) 40.0 and over, adult: Secondary | ICD-10-CM | POA: Diagnosis not present

## 2024-04-26 DIAGNOSIS — Z713 Dietary counseling and surveillance: Secondary | ICD-10-CM | POA: Diagnosis not present

## 2024-04-26 DIAGNOSIS — N926 Irregular menstruation, unspecified: Secondary | ICD-10-CM

## 2024-04-26 NOTE — Telephone Encounter (Addendum)
 Fax from [employer], [email or web address], asking for diagnosis codes to treat patient via insurance.  Forms have been given to Dr Verdon to sign.

## 2024-04-26 NOTE — Telephone Encounter (Signed)
 Dr Verdon did not sign the paperwork, would like it sent back to the referring physician.  Per Chrissy at the dietician's office.  Patient came on her own and does not need a referral.  Gave her information for her PCP office.

## 2024-04-28 ENCOUNTER — Telehealth (INDEPENDENT_AMBULATORY_CARE_PROVIDER_SITE_OTHER): Payer: Self-pay | Admitting: Family Medicine

## 2024-04-28 ENCOUNTER — Encounter (INDEPENDENT_AMBULATORY_CARE_PROVIDER_SITE_OTHER): Payer: Self-pay | Admitting: Family Medicine

## 2024-04-28 ENCOUNTER — Ambulatory Visit (INDEPENDENT_AMBULATORY_CARE_PROVIDER_SITE_OTHER): Admitting: Family Medicine

## 2024-04-28 VITALS — BP 123/86 | HR 89 | Temp 97.7°F | Ht 65.0 in | Wt 305.0 lb

## 2024-04-28 DIAGNOSIS — F4389 Other reactions to severe stress: Secondary | ICD-10-CM | POA: Diagnosis not present

## 2024-04-28 DIAGNOSIS — F411 Generalized anxiety disorder: Secondary | ICD-10-CM | POA: Diagnosis not present

## 2024-04-28 DIAGNOSIS — R7303 Prediabetes: Secondary | ICD-10-CM | POA: Diagnosis not present

## 2024-04-28 DIAGNOSIS — E559 Vitamin D deficiency, unspecified: Secondary | ICD-10-CM | POA: Diagnosis not present

## 2024-04-28 DIAGNOSIS — Z6841 Body Mass Index (BMI) 40.0 and over, adult: Secondary | ICD-10-CM

## 2024-04-28 DIAGNOSIS — F250 Schizoaffective disorder, bipolar type: Secondary | ICD-10-CM | POA: Diagnosis not present

## 2024-04-28 DIAGNOSIS — R609 Edema, unspecified: Secondary | ICD-10-CM | POA: Diagnosis not present

## 2024-04-28 DIAGNOSIS — E7800 Pure hypercholesterolemia, unspecified: Secondary | ICD-10-CM

## 2024-04-28 DIAGNOSIS — N943 Premenstrual tension syndrome: Secondary | ICD-10-CM | POA: Diagnosis not present

## 2024-04-28 DIAGNOSIS — E785 Hyperlipidemia, unspecified: Secondary | ICD-10-CM

## 2024-04-28 DIAGNOSIS — D563 Thalassemia minor: Secondary | ICD-10-CM

## 2024-04-28 DIAGNOSIS — E66813 Obesity, class 3: Secondary | ICD-10-CM

## 2024-04-28 DIAGNOSIS — F28 Other psychotic disorder not due to a substance or known physiological condition: Secondary | ICD-10-CM | POA: Diagnosis not present

## 2024-04-28 DIAGNOSIS — F902 Attention-deficit hyperactivity disorder, combined type: Secondary | ICD-10-CM | POA: Diagnosis not present

## 2024-04-28 DIAGNOSIS — F4312 Post-traumatic stress disorder, chronic: Secondary | ICD-10-CM | POA: Diagnosis not present

## 2024-04-28 MED ORDER — VITAMIN D (ERGOCALCIFEROL) 1.25 MG (50000 UNIT) PO CAPS: 50000.0000 [IU] | ORAL_CAPSULE | ORAL | 1 refills | Status: DC

## 2024-04-28 MED ORDER — METFORMIN HCL 500 MG PO TABS: ORAL_TABLET | ORAL | 1 refills | Status: DC

## 2024-04-28 NOTE — Progress Notes (Signed)
 Office: [phone]  /  Fax: [phone]  WEIGHT SUMMARY AND BIOMETRICS  Anthropometric Measurements Height: 5' 5 (1.651 m) Weight: (!) 305 lb (138.3 kg) BMI (Calculated): 50.75 Weight at Last Visit: 296 lb Weight Lost Since Last Visit: 0 Weight Gained Since Last Visit: 9 lb Starting Weight: 295 lb Total Weight Loss (lbs): 0 lb (0 kg) Peak Weight: 337 lb   Body Composition  Body Fat %: 50.6 % Fat Mass (lbs): 154.6 lbs Muscle Mass (lbs): 143.2 lbs Total Body Water (lbs): 104.4 lbs Visceral Fat Rating : 16   Other Clinical Data Fasting: yes Labs: no Today's Visit #: 11 Starting Date: 11/09/23 Comments: cat 2    Chief Complaint: OBESITY   History of Present Illness Kathy Rios is a 23 year old female who presents for obesity treatment progress evaluation.  She is following the category two eating plan about fifty percent of the time and is walking for thirty minutes one to two times a week. Despite these efforts, she has gained nine pounds in the last month. She experiences swelling, particularly in her ankles, which was more pronounced during her recent vacation.  She is currently taking metformin  without any issues and has recently stopped clonidine , which was initially prescribed for sleep. Her cravings have lessened since discontinuing clonidine . Her A1c has improved from 5.9 to 5.7, and she is taking vitamin D  supplements, although she sometimes forgets the weekly dose. Her most recent vitamin D  level was 31, with a target of 50.  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 She has been using a seated elliptical for thirty minutes and is exploring classes at the [facility].  Her cholesterol levels are a concern, with an LDL of 168 and total cholesterol of 245. She has a family history of heart disease, with her paternal grandfather having died of atherosclerosis at 70  and other relatives having strokes and heart attacks. She is not currently on any cholesterol medication.  She is on Benicar  for blood pressure management and has no history of diabetes or smoking. 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 Her insulin  levels have decreased from 34 to 29 since January.      PHYSICAL EXAM:  Blood pressure 123/86, pulse 89, temperature 97.7 F (36.5 C), height 5' 5 (1.651 m), weight (!) 305 lb (138.3 kg), SpO2 99%. Body mass index is 50.75 kg/m.  DIAGNOSTIC DATA REVIEWED:  BMET    Component Value Date/Time   NA 136 04/13/2024 1039   K 5.0 04/13/2024 1039   CL 101 04/13/2024 1039   CO2 20 04/13/2024 1039   GLUCOSE 88 04/13/2024 1039   GLUCOSE 106 (H) 09/23/2021 1824   BUN 17 04/13/2024 1039   CREATININE 0.71 04/13/2024 1039   CALCIUM 10.1 04/13/2024 1039   GFRNONAA >60 09/23/2021 1824   GFRAA 146 08/08/2020 1453   Lab Results  Component Value Date   HGBA1C 5.7 (H) 04/13/2024   HGBA1C 6.0 (H) 05/23/2021   Lab Results  Component Value Date   INSULIN  29.8 (H) 04/13/2024   Lab Results  Component Value Date   TSH 1.460 03/09/2024   CBC    Component Value Date/Time   WBC 7.3 04/13/2024 1039   WBC 8.1 01/13/2024 0930   RBC 5.35 (H) 04/13/2024 1039   RBC 4.91 01/13/2024 0930   HGB 11.2 04/13/2024 1039   HCT 37.6 04/13/2024 1039  PLT 420 04/13/2024 1039   MCV 70 (L) 04/13/2024 1039   MCH 20.9 (L) 04/13/2024 1039   MCH 21.6 (L) 01/13/2024 0930   MCHC 29.8 (L) 04/13/2024 1039   MCHC 30.6 01/13/2024 0930   RDW 15.9 (H) 04/13/2024 1039   Iron Studies    Component Value Date/Time   IRON 46 01/13/2024 0930   IRON 36 01/04/2024 1048   TIBC 328 01/13/2024 0930   TIBC 281 01/04/2024 1048   FERRITIN 106 01/13/2024 0930   FERRITIN 200 (H) 01/04/2024 1048   IRONPCTSAT 14 01/13/2024 0930   IRONPCTSAT 13 (L) 01/04/2024 1048   Lipid Panel     Component Value Date/Time   CHOL 245 (H)  04/13/2024 1039   TRIG 71 04/13/2024 1039   HDL 65 04/13/2024 1039   CHOLHDL 4.2 01/04/2024 1048   LDLCALC 168 (H) 04/13/2024 1039   Hepatic Function Panel     Component Value Date/Time   PROT 8.2 04/13/2024 1039   ALBUMIN 4.4 04/13/2024 1039   AST 11 04/13/2024 1039   ALT 9 04/13/2024 1039   ALKPHOS 116 04/13/2024 1039   BILITOT 0.3 04/13/2024 1039      Component Value Date/Time   TSH 1.460 03/09/2024 1217   Nutritional Lab Results  Component Value Date   VD25OH 31.3 04/13/2024   VD25OH 34.8 01/04/2024   VD25OH 33.1 11/09/2023     Assessment and Plan Assessment & Plan Class 3 Obesity Class 3 obesity with recent weight gain of 9 pounds over the last month. She is following the category two eating plan 50% of the time and walking 30 minutes 1-2 times a week. Discussed the impact of clonidine  on weight gain and its recent discontinuation. - Continue category two eating plan with improved adherence - Increase physical activity, including chair yoga, water aerobics, and strength training - Encourage hydration to manage fluid retention   Prediabetes Prediabetes with improved A1c from 5.9 to 5.7. Metformin  is well tolerated and contributing to glycemic control. - Continue metformin  - Monitor A1c levels regularly - Encourage adherence to dietary and exercise recommendations  Dyslipidemia Dyslipidemia with LDL at 168 and total cholesterol at 245. Discussed potential impact of lurasidone  on cholesterol levels. Current ASCVD risk is low due to age, and statin therapy is not indicated at this time. Discussed family history of cardiovascular disease and potential future need for statin therapy. - Monitor cholesterol levels every 3 months - Continue dietary and lifestyle modifications - Reassess need for statin therapy in future visits  Vitamin D  Deficiency Vitamin D  deficiency with recent level of 31, aiming to increase to the 50s. She is on a weekly dose but occasionally  forgets to take it. - Continue weekly vitamin D  supplementation - Educate on taking missed doses within a couple of days without doubling up - Reassess vitamin D  levels in future visits  Edema of Lower Extremities Intermittent edema of lower extremities, likely related to recent travel and dietary sodium intake. - Encourage increased hydration - Monitor for changes in edema with lifestyle modifications. 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 No acute concerns at this time. - Continue routine monitoring of hematologic parameters and will factor this in with future results.    Kathy Rios was informed of the importance of frequent follow up visits to maximize her success with intensive lifestyle modifications for her obesity and obesity related health conditions as recommended  by USPSTF and CMS guidelines   Louann Penton, MD

## 2024-05-02 ENCOUNTER — Telehealth: Payer: Self-pay

## 2024-05-02 NOTE — Telephone Encounter (Signed)
 Called and will fax form again.  Copied from CRM [id number]. Topic: General - Other >> May 02, 2024  3:06 PM Emylou G wrote: Reason for CRM: [employer] dietician called.. checking status of paperwork.. has been working with patient since 07-Apr-2024.. Needs codes to be added to the paperwork so she can get this approved with her insurance.  Call back number [phone]

## 2024-05-02 NOTE — Telephone Encounter (Signed)
 Dr Verdon stated that since she did not refer her to this dietician, she is unable to sign the papers that are needed from the dietician.  She can obtain a signature from the patient's PCP.

## 2024-05-04 NOTE — Telephone Encounter (Signed)
Faxed and sent to scan.

## 2024-05-05 DIAGNOSIS — F4389 Other reactions to severe stress: Secondary | ICD-10-CM | POA: Diagnosis not present

## 2024-05-05 DIAGNOSIS — F28 Other psychotic disorder not due to a substance or known physiological condition: Secondary | ICD-10-CM | POA: Diagnosis not present

## 2024-05-10 DIAGNOSIS — I10 Essential (primary) hypertension: Secondary | ICD-10-CM | POA: Diagnosis not present

## 2024-05-10 DIAGNOSIS — E66813 Obesity, class 3: Secondary | ICD-10-CM | POA: Diagnosis not present

## 2024-05-10 DIAGNOSIS — Z713 Dietary counseling and surveillance: Secondary | ICD-10-CM | POA: Diagnosis not present

## 2024-05-12 ENCOUNTER — Encounter: Payer: Self-pay | Admitting: Obstetrics and Gynecology

## 2024-05-12 ENCOUNTER — Ambulatory Visit (INDEPENDENT_AMBULATORY_CARE_PROVIDER_SITE_OTHER): Admitting: Obstetrics and Gynecology

## 2024-05-12 VITALS — BP 134/80 | HR 81 | Wt 301.0 lb

## 2024-05-12 DIAGNOSIS — F4389 Other reactions to severe stress: Secondary | ICD-10-CM | POA: Diagnosis not present

## 2024-05-12 DIAGNOSIS — N926 Irregular menstruation, unspecified: Secondary | ICD-10-CM

## 2024-05-12 DIAGNOSIS — F28 Other psychotic disorder not due to a substance or known physiological condition: Secondary | ICD-10-CM | POA: Diagnosis not present

## 2024-05-12 NOTE — Progress Notes (Signed)
   ESTABLISHED GYNECOLOGY VISIT Chief Complaint  Patient presents with   Menstrual Problem    Subjective:  Kathy Rios is a 23 y.o. G0P0000 presenting for irregular bleeding  Previously on slynd . Has been off for several months. Last saw Dr. Erik on 8/1.  She had a period 7/29, then light bleeding on/off between 8/22-8/27, then a normal period 8/31 x 6 days. She started her next period today 9/25.  She had normal labs in July. Last ultrasound 2022  No changes in medications, not taking hormonal birth control. Not sexually active x 2 years.  Review of Systems:   Pertinent items are noted in HPI  Pertinent History Reviewed:  Reviewed past medical,surgical, social and family history.  Reviewed problem list, medications and allergies.  Objective:   Vitals:   05/12/24 1109  BP: 134/80  Pulse: 81  Weight: (!) 301 lb (136.5 kg)   Physical Examination:   General appearance - well appearing, and in no distress  Mental status - alert, oriented to person, place, and time    Assessment and Plan:  1. Irregular bleeding (Primary) One episode of abnormal bleeding. Pap is up to date. Recent normal testing including TSH. Recommend observe cycles x 3 months, if persistent irregular bleeding, consider ultrasound. Patient comfortable with observation for now. Not interested in medical management.    Return in about 3 months (around 08/11/2024), or with Iqra Rotundo/[facility] for bleeding follow up.  Future Appointments  Date Time Provider Department Center  05/26/2024  2:40 PM Verdon Louann BIRCH, MD [facility] None  06/23/2024  9:40 AM Verdon Louann BIRCH, MD [facility] None  07/06/2024 10:20 AM Bevely Doffing, FNP [facility] [address]  10/26/2024 11:00 AM Iva Marty Saltness, MD [facility] None  01/04/2025 10:00 AM [facility] None  01/11/2025 10:30 AM Lamon Pleasant HERO, PA-C [facility] None    Rollo ONEIDA Bring, MD, FACOG Obstetrician & Gynecologist, [facility] for  [facility], [facility]

## 2024-05-12 NOTE — Progress Notes (Signed)
 23 y.o. GYN presents for Irregular vaginal bleeding. C/o 2 periods in August; 8/22-26/2025 and 8/31-04/22/2024. LMP 05/12/24.  She no longer takes the OCP.    Last PAP 03/18/24  NILM

## 2024-05-17 DIAGNOSIS — E66813 Obesity, class 3: Secondary | ICD-10-CM | POA: Diagnosis not present

## 2024-05-17 DIAGNOSIS — I10 Essential (primary) hypertension: Secondary | ICD-10-CM | POA: Diagnosis not present

## 2024-05-19 DIAGNOSIS — F4389 Other reactions to severe stress: Secondary | ICD-10-CM | POA: Diagnosis not present

## 2024-05-19 DIAGNOSIS — F28 Other psychotic disorder not due to a substance or known physiological condition: Secondary | ICD-10-CM | POA: Diagnosis not present

## 2024-05-23 DIAGNOSIS — F250 Schizoaffective disorder, bipolar type: Secondary | ICD-10-CM | POA: Diagnosis not present

## 2024-05-23 DIAGNOSIS — F902 Attention-deficit hyperactivity disorder, combined type: Secondary | ICD-10-CM | POA: Diagnosis not present

## 2024-05-23 DIAGNOSIS — E66813 Obesity, class 3: Secondary | ICD-10-CM | POA: Diagnosis not present

## 2024-05-23 DIAGNOSIS — F4312 Post-traumatic stress disorder, chronic: Secondary | ICD-10-CM | POA: Diagnosis not present

## 2024-05-23 DIAGNOSIS — F411 Generalized anxiety disorder: Secondary | ICD-10-CM | POA: Diagnosis not present

## 2024-05-23 DIAGNOSIS — Z79899 Other long term (current) drug therapy: Secondary | ICD-10-CM | POA: Diagnosis not present

## 2024-05-26 ENCOUNTER — Encounter (INDEPENDENT_AMBULATORY_CARE_PROVIDER_SITE_OTHER): Payer: Self-pay | Admitting: Family Medicine

## 2024-05-26 ENCOUNTER — Ambulatory Visit (INDEPENDENT_AMBULATORY_CARE_PROVIDER_SITE_OTHER): Admitting: Family Medicine

## 2024-05-26 VITALS — BP 112/72 | HR 90 | Temp 98.1°F | Ht 65.0 in | Wt 307.0 lb

## 2024-05-26 DIAGNOSIS — E669 Obesity, unspecified: Secondary | ICD-10-CM

## 2024-05-26 DIAGNOSIS — F28 Other psychotic disorder not due to a substance or known physiological condition: Secondary | ICD-10-CM | POA: Diagnosis not present

## 2024-05-26 DIAGNOSIS — E559 Vitamin D deficiency, unspecified: Secondary | ICD-10-CM | POA: Diagnosis not present

## 2024-05-26 DIAGNOSIS — I10 Essential (primary) hypertension: Secondary | ICD-10-CM | POA: Diagnosis not present

## 2024-05-26 DIAGNOSIS — Z6841 Body Mass Index (BMI) 40.0 and over, adult: Secondary | ICD-10-CM

## 2024-05-26 DIAGNOSIS — F4389 Other reactions to severe stress: Secondary | ICD-10-CM | POA: Diagnosis not present

## 2024-05-26 DIAGNOSIS — R7303 Prediabetes: Secondary | ICD-10-CM

## 2024-05-26 MED ORDER — TIRZEPATIDE-WEIGHT MANAGEMENT 2.5 MG/0.5ML ~~LOC~~ SOLN: 2.5000 mg | SUBCUTANEOUS | 0 refills | Status: DC

## 2024-05-26 MED ORDER — VITAMIN D (ERGOCALCIFEROL) 1.25 MG (50000 UNIT) PO CAPS: 50000.0000 [IU] | ORAL_CAPSULE | ORAL | 1 refills | Status: DC

## 2024-05-26 MED ORDER — METFORMIN HCL 500 MG PO TABS: ORAL_TABLET | ORAL | 1 refills | Status: DC

## 2024-05-26 NOTE — Progress Notes (Signed)
 Office: [phone]  /  Fax: [phone]  WEIGHT SUMMARY AND BIOMETRICS  Anthropometric Measurements Height: 5' 5 (1.651 m) Weight: (!) 307 lb (139.3 kg) BMI (Calculated): 51.09 Weight at Last Visit: 305 lb Weight Lost Since Last Visit: 0 Weight Gained Since Last Visit: 2 lb Starting Weight: 295 lb Total Weight Loss (lbs): 0 lb (0 kg) Peak Weight: 337 lb   Body Composition  Body Fat %: 52.9 % Fat Mass (lbs): 162.8 lbs Muscle Mass (lbs): 137.8 lbs Total Body Water (lbs): 100.2 lbs Visceral Fat Rating : 17   Other Clinical Data Fasting: no Labs: no Today's Visit #: 12 Starting Date: 11/09/23 Comments: cat 2    Chief Complaint: OBESITY  History of Present Illness Kathy Rios is a 23 year old female with obesity and prediabetes who presents for obesity treatment and progress assessment.  She is actively working with a Museum/gallery exhibitions officer to improve her nutritional habits, focusing on consuming more whole foods, such as fruits and vegetables, and ensuring adequate protein intake. She is also making efforts to stay hydrated and not skip meals. Despite these efforts, she has gained two pounds since her last visit a month ago. Her hunger levels are well managed, and she is using an app called [employer] to track her hydration, which she finds helpful.  She engages in physical activity by participating in water aerobics for sixty minutes, two days a week, although she aims for three times a week. Her sleep is adequate, averaging seven or more hours most nights. Despite these lifestyle changes, she has noticed a weight gain.  She is currently on metformin  for her prediabetes, taking 500 mg twice in the morning and once in the afternoon. She manages her medication schedule well, sometimes spacing out the doses throughout the day based on her meal times. She has previously been on Ozempic  and Zioptan for weight management, with Zioptan being particularly  effective, resulting in a weight loss of 26 pounds over four months.  Her blood pressure management has improved since discontinuing clonidine , which was causing dizziness. She is no longer experiencing lightheadedness and is monitoring her blood pressure regularly.  She reports that Dr. Antonetta, her psychiatrist, advised against bariatric surgery due to her high-functioning schizoaffective disorder and concerns about medication absorption. She is considering incorporating strength training into her routine to complement her water aerobics.      PHYSICAL EXAM:  Blood pressure 112/72, pulse 90, temperature 98.1 F (36.7 C), height 5' 5 (1.651 m), weight (!) 307 lb (139.3 kg), last menstrual period 05/12/2024, SpO2 97%. Body mass index is 51.09 kg/m.  DIAGNOSTIC DATA REVIEWED:  BMET    Component Value Date/Time   NA 136 04/13/2024 1039   K 5.0 04/13/2024 1039   CL 101 04/13/2024 1039   CO2 20 04/13/2024 1039   GLUCOSE 88 04/13/2024 1039   GLUCOSE 106 (H) 09/23/2021 1824   BUN 17 04/13/2024 1039   CREATININE 0.71 04/13/2024 1039   CALCIUM 10.1 04/13/2024 1039   GFRNONAA >60 09/23/2021 1824   GFRAA 146 08/08/2020 1453   Lab Results  Component Value Date   HGBA1C 5.7 (H) 04/13/2024   HGBA1C 6.0 (H) 05/23/2021   Lab Results  Component Value Date   INSULIN  29.8 (H) 04/13/2024   Lab Results  Component Value Date   TSH 1.460 03/09/2024   CBC    Component Value Date/Time   WBC 7.3 04/13/2024 1039   WBC 8.1 01/13/2024 0930   RBC 5.35 (H) 04/13/2024  1039   RBC 4.91 01/13/2024 0930   HGB 11.2 04/13/2024 1039   HCT 37.6 04/13/2024 1039   PLT 420 04/13/2024 1039   MCV 70 (L) 04/13/2024 1039   MCH 20.9 (L) 04/13/2024 1039   MCH 21.6 (L) 01/13/2024 0930   MCHC 29.8 (L) 04/13/2024 1039   MCHC 30.6 01/13/2024 0930   RDW 15.9 (H) 04/13/2024 1039   Iron Studies    Component Value Date/Time   IRON 46 01/13/2024 0930   IRON 36 01/04/2024 1048   TIBC 328 01/13/2024 0930    TIBC 281 01/04/2024 1048   FERRITIN 106 01/13/2024 0930   FERRITIN 200 (H) 01/04/2024 1048   IRONPCTSAT 14 01/13/2024 0930   IRONPCTSAT 13 (L) 01/04/2024 1048   Lipid Panel     Component Value Date/Time   CHOL 245 (H) 04/13/2024 1039   TRIG 71 04/13/2024 1039   HDL 65 04/13/2024 1039   CHOLHDL 4.2 01/04/2024 1048   LDLCALC 168 (H) 04/13/2024 1039   Hepatic Function Panel     Component Value Date/Time   PROT 8.2 04/13/2024 1039   ALBUMIN 4.4 04/13/2024 1039   AST 11 04/13/2024 1039   ALT 9 04/13/2024 1039   ALKPHOS 116 04/13/2024 1039   BILITOT 0.3 04/13/2024 1039      Component Value Date/Time   TSH 1.460 03/09/2024 1217   Nutritional Lab Results  Component Value Date   VD25OH 31.3 04/13/2024   VD25OH 34.8 01/04/2024   VD25OH 33.1 11/09/2023     Assessment and Plan Assessment & Plan Obesity Obesity management is ongoing with lifestyle modifications. Despite engaging in water aerobics twice a week and working with a dietitian to improve nutrition, she has gained two pounds in the last month, with an increase in fat and decrease in muscle mass. The importance of maintaining muscle mass through adequate protein intake and strength training was emphasized. Discussed potential for bariatric surgery, including types, benefits, and implications, but concerns were raised about mental health and medication absorption. GLP-1 agonists were discussed as an alternative, with a plan to pursue prior authorization for Zepbound , which she has used successfully in the past. - Continue water aerobics twice a week - Increase protein intake to 110-140 grams per day - Incorporate strength training exercises - Pursue prior authorization for Zepbound  - Consider bariatric surgery if GLP-1 agonists are not effective  Prediabetes Prediabetes is managed with metformin , 500 mg twice in the morning and once in the afternoon. She adheres to the medication regimen and has found a dosing schedule  that works well for her. - Continue metformin  500 mg, two in the morning and one in the afternoon  Essential hypertension Essential hypertension is well-controlled with olmesartan . She reports no dizziness or lightheadedness, indicating stable blood pressure control after discontinuing clonidine  due to dizziness. - Continue current antihypertensive regimen - Monitor blood pressure regularly  Vitamin D  deficiency Vitamin D  deficiency is managed with supplementation. Recent labs in August will be rechecked before Christmas to ensure levels remain optimal and do not become excessively high. - Refill vitamin D  prescription - Recheck vitamin D  levels before Christmas     I personally spent a total of 42 minutes in the care of the patient today including preparing to see the patient, performing a medically appropriate evaluation of current problems, placing orders in the EMR, documenting clinical information in the EMR, customized nutritional counseling for their specific health and social needs, and discussing results with the patient and educating them on how these  results can affect their health and weight and pros and cons of various bariatric surgery options    Kathy Rios was informed of the importance of frequent follow up visits to maximize her success with intensive lifestyle modifications for her obesity and obesity related health conditions as recommended by USPSTF and CMS guidelines   Louann Penton, MD

## 2024-05-30 ENCOUNTER — Telehealth (INDEPENDENT_AMBULATORY_CARE_PROVIDER_SITE_OTHER): Payer: Self-pay

## 2024-05-30 NOTE — Telephone Encounter (Signed)
 Dear [name] Sutliff: [facility] [employer] received a request for coverage of Zepbound  2.5MG /0.5ML [place] SOAJ for you. Your request was denied based on the terms of your prescription benefit plan. This is the initial adverse coverage determination for this request. The reason for the denial was: *Drug Not Covered/Plan Exclusion - Your request for coverage was denied because your prescription benefit plan does not cover the requested medication.  Patient notified via my chart.

## 2024-05-30 NOTE — Telephone Encounter (Signed)
 Prior auth started for Zepbound  for Obesity 2.5 mg

## 2024-05-30 NOTE — Telephone Encounter (Signed)
 Your demographic data has been sent to [facility] successfully!  [employer] typically takes up to one hour to respond, but it may take a little longer in some cases. You will be notified by email when available. You can also check for an update later by opening this request from your dashboard. Please do not fax or call [employer] to resubmit this request. If you need assistance, please chat with CoverMyMeds or call us at [phone].  If it has been longer than 24 hours, please reach out to [employer].

## 2024-06-01 ENCOUNTER — Telehealth (INDEPENDENT_AMBULATORY_CARE_PROVIDER_SITE_OTHER): Payer: Self-pay

## 2024-06-01 NOTE — Telephone Encounter (Signed)
 Wait for Determination  Please wait for [employer] to return a determination.

## 2024-06-02 DIAGNOSIS — F28 Other psychotic disorder not due to a substance or known physiological condition: Secondary | ICD-10-CM | POA: Diagnosis not present

## 2024-06-02 DIAGNOSIS — F4389 Other reactions to severe stress: Secondary | ICD-10-CM | POA: Diagnosis not present

## 2024-06-02 NOTE — Telephone Encounter (Signed)
 Resent request due to info incorrect and was sent.

## 2024-06-09 ENCOUNTER — Encounter: Payer: Self-pay | Admitting: Obstetrics and Gynecology

## 2024-06-09 DIAGNOSIS — F4389 Other reactions to severe stress: Secondary | ICD-10-CM | POA: Diagnosis not present

## 2024-06-09 DIAGNOSIS — F28 Other psychotic disorder not due to a substance or known physiological condition: Secondary | ICD-10-CM | POA: Diagnosis not present

## 2024-06-16 DIAGNOSIS — F28 Other psychotic disorder not due to a substance or known physiological condition: Secondary | ICD-10-CM | POA: Diagnosis not present

## 2024-06-16 DIAGNOSIS — F4389 Other reactions to severe stress: Secondary | ICD-10-CM | POA: Diagnosis not present

## 2024-06-23 ENCOUNTER — Ambulatory Visit (INDEPENDENT_AMBULATORY_CARE_PROVIDER_SITE_OTHER): Payer: Self-pay | Admitting: Family Medicine

## 2024-06-23 ENCOUNTER — Encounter (INDEPENDENT_AMBULATORY_CARE_PROVIDER_SITE_OTHER): Payer: Self-pay | Admitting: Family Medicine

## 2024-06-23 VITALS — BP 126/78 | HR 111 | Temp 97.7°F | Ht 65.0 in | Wt 319.0 lb

## 2024-06-23 DIAGNOSIS — R7303 Prediabetes: Secondary | ICD-10-CM

## 2024-06-23 DIAGNOSIS — F5089 Other specified eating disorder: Secondary | ICD-10-CM

## 2024-06-23 DIAGNOSIS — F3289 Other specified depressive episodes: Secondary | ICD-10-CM

## 2024-06-23 DIAGNOSIS — F28 Other psychotic disorder not due to a substance or known physiological condition: Secondary | ICD-10-CM | POA: Diagnosis not present

## 2024-06-23 DIAGNOSIS — E559 Vitamin D deficiency, unspecified: Secondary | ICD-10-CM

## 2024-06-23 DIAGNOSIS — Z6841 Body Mass Index (BMI) 40.0 and over, adult: Secondary | ICD-10-CM

## 2024-06-23 DIAGNOSIS — F250 Schizoaffective disorder, bipolar type: Secondary | ICD-10-CM | POA: Diagnosis not present

## 2024-06-23 DIAGNOSIS — F4312 Post-traumatic stress disorder, chronic: Secondary | ICD-10-CM | POA: Diagnosis not present

## 2024-06-23 DIAGNOSIS — F411 Generalized anxiety disorder: Secondary | ICD-10-CM | POA: Diagnosis not present

## 2024-06-23 DIAGNOSIS — F902 Attention-deficit hyperactivity disorder, combined type: Secondary | ICD-10-CM | POA: Diagnosis not present

## 2024-06-23 DIAGNOSIS — F4389 Other reactions to severe stress: Secondary | ICD-10-CM | POA: Diagnosis not present

## 2024-06-23 MED ORDER — VITAMIN D (ERGOCALCIFEROL) 1.25 MG (50000 UNIT) PO CAPS: 50000.0000 [IU] | ORAL_CAPSULE | ORAL | 1 refills | Status: DC

## 2024-06-23 MED ORDER — METFORMIN HCL 500 MG PO TABS: ORAL_TABLET | ORAL | 1 refills | Status: AC

## 2024-06-23 NOTE — Progress Notes (Signed)
 Office: [phone]  /  Fax: [phone]  WEIGHT SUMMARY AND BIOMETRICS  Anthropometric Measurements Height: 5' 5 (1.651 m) Weight: (!) 319 lb (144.7 kg) BMI (Calculated): 53.08 Weight at Last Visit: 307 lb Weight Lost Since Last Visit: 0 Weight Gained Since Last Visit: 12 lb Starting Weight: 295 lb Total Weight Loss (lbs): 0 lb (0 kg) Peak Weight: 337 lb   Body Composition  Body Fat %: 54.9 % Fat Mass (lbs): 175.6 lbs Muscle Mass (lbs): 137 lbs Total Body Water (lbs): 108 lbs Visceral Fat Rating : 18   Other Clinical Data Fasting: no Labs: no Today's Visit #: 13 Starting Date: 11/09/23    Chief Complaint: OBESITY    History of Present Illness Kathy Rios is a 23 year old female with obesity and prediabetes who presents with challenges in her weight management plan.  She is experiencing difficulty adhering to a structured weight loss plan and has gained 12 pounds since her last visit a month ago. Despite receiving nutritional advice from a dietitian, she finds it unhelpful and is not currently engaging in any exercise regimen.  She has a history of prediabetes. She is currently taking metformin  500 mg, three times a day.  She is dissatisfied with her current meal plan, describing it as 'boring' and difficult to follow. She wants to explore a meal plan that allows her to enjoy more of the foods she likes. She recalls being advised to maintain a caloric intake between 1500 and 1900 calories and a protein intake of 140 grams per day, based on her REE and weight of 144 kilograms.  She struggles with cravings for sweet and carbohydrate-rich foods, which she finds challenging to manage. She has experimented with sugar-free alternatives and is exploring ways to incorporate small amounts of her preferred foods, like butter and maple syrup, into her diet without exceeding her caloric budget.  She uses the [employer] app to track her calories and is considering  journaling her food intake to better manage her diet. She has been exploring various protein-rich food options and low-calorie recipes to help satisfy her cravings while adhering to her dietary goals.  She mentions emotional challenges related to her diet, noting that the restrictions have been mentally and emotionally taxing.      PHYSICAL EXAM:  Blood pressure 126/78, pulse (!) 111, temperature 97.7 F (36.5 C), height 5' 5 (1.651 m), weight (!) 319 lb (144.7 kg), SpO2 97%. Body mass index is 53.08 kg/m.  DIAGNOSTIC DATA REVIEWED:  BMET    Component Value Date/Time   NA 136 04/13/2024 1039   K 5.0 04/13/2024 1039   CL 101 04/13/2024 1039   CO2 20 04/13/2024 1039   GLUCOSE 88 04/13/2024 1039   GLUCOSE 106 (H) 09/23/2021 1824   BUN 17 04/13/2024 1039   CREATININE 0.71 04/13/2024 1039   CALCIUM 10.1 04/13/2024 1039   GFRNONAA >60 09/23/2021 1824   GFRAA 146 08/08/2020 1453   Lab Results  Component Value Date   HGBA1C 5.7 (H) 04/13/2024   HGBA1C 6.0 (H) 05/23/2021   Lab Results  Component Value Date   INSULIN  29.8 (H) 04/13/2024   Lab Results  Component Value Date   TSH 1.460 03/09/2024   CBC    Component Value Date/Time   WBC 7.3 04/13/2024 1039   WBC 8.1 01/13/2024 0930   RBC 5.35 (H) 04/13/2024 1039   RBC 4.91 01/13/2024 0930   HGB 11.2 04/13/2024 1039   HCT 37.6 04/13/2024 1039  PLT 420 04/13/2024 1039   MCV 70 (L) 04/13/2024 1039   MCH 20.9 (L) 04/13/2024 1039   MCH 21.6 (L) 01/13/2024 0930   MCHC 29.8 (L) 04/13/2024 1039   MCHC 30.6 01/13/2024 0930   RDW 15.9 (H) 04/13/2024 1039   Iron Studies    Component Value Date/Time   IRON 46 01/13/2024 0930   IRON 36 01/04/2024 1048   TIBC 328 01/13/2024 0930   TIBC 281 01/04/2024 1048   FERRITIN 106 01/13/2024 0930   FERRITIN 200 (H) 01/04/2024 1048   IRONPCTSAT 14 01/13/2024 0930   IRONPCTSAT 13 (L) 01/04/2024 1048   Lipid Panel     Component Value Date/Time   CHOL 245 (H) 04/13/2024 1039    TRIG 71 04/13/2024 1039   HDL 65 04/13/2024 1039   CHOLHDL 4.2 01/04/2024 1048   LDLCALC 168 (H) 04/13/2024 1039   Hepatic Function Panel     Component Value Date/Time   PROT 8.2 04/13/2024 1039   ALBUMIN 4.4 04/13/2024 1039   AST 11 04/13/2024 1039   ALT 9 04/13/2024 1039   ALKPHOS 116 04/13/2024 1039   BILITOT 0.3 04/13/2024 1039      Component Value Date/Time   TSH 1.460 03/09/2024 1217   Nutritional Lab Results  Component Value Date   VD25OH 31.3 04/13/2024   VD25OH 34.8 01/04/2024   VD25OH 33.1 11/09/2023     Assessment and Plan Assessment & Plan Morbid obesity due to excess calories with BMI 50.0-59.9 Struggling with weight loss due to difficulty adhering to a structured meal plan. Reports boredom with current meal plan and difficulty with emotional eating. Current weight gain of 12 pounds since last visit. Not currently exercising. Emphasized importance of maintaining a calorie range of 1500-1900 calories per day and ensuring adequate protein intake of 140 grams per day to support metabolism and weight loss. Discussed the impact of emotional eating and the role of neurotransmitters in cravings. - Switched to a journaling meal plan for more flexibility. - Provided recipe ideas with calorie and protein information. - Encouraged use of low-calorie alternatives like butter spray and light mayo. - Recommended using Lose It app for tracking calories and protein intake. - Provided recipes for high-protein, low-calorie meals. - Encouraged meal planning and journaling to manage cravings.  Prediabetes Currently on metformin  500 mg, three times a day. Discussed the importance of dietary management in conjunction with medication to control blood glucose levels. Emphasized the need for a balanced diet with appropriate carbohydrate intake to manage prediabetes. - Continue metformin  500 mg, three times a day, refill today  Vit D deficiency Stable on vit E prescription - Refill  vit D -Will follow with labs in the next 1-2 months  Emotional eating behaviors Reports emotional eating behaviors contributing to weight management challenges. Discussed the impact of neurotransmitters on cravings and the importance of addressing emotional eating. Offered referral to a psychologist for further support. - Provided referral to a psychologist for emotional eating support. - Encouraged use of strategies to manage cravings, such as using low-calorie sweeteners and portion control.     Jasilyn was counseled on the importance of maintaining healthy lifestyle habits, including balanced nutrition, regular physical activity, and behavioral modifications, while taking antiobesity medication.  Patient verbalized understanding that medication is an adjunct to, not a replacement for, lifestyle changes and that the long-term success and weight maintenance depend on continued adherence to these strategies.   Cloa was informed of the importance of frequent follow up visits to maximize her  success with intensive lifestyle modifications for her obesity and obesity related health conditions as recommended by USPSTF and CMS guidelines   Louann Penton, MD

## 2024-06-27 NOTE — Progress Notes (Signed)
 Entered in error

## 2024-06-28 ENCOUNTER — Ambulatory Visit

## 2024-06-28 ENCOUNTER — Encounter (INDEPENDENT_AMBULATORY_CARE_PROVIDER_SITE_OTHER): Payer: Self-pay

## 2024-06-28 ENCOUNTER — Telehealth (INDEPENDENT_AMBULATORY_CARE_PROVIDER_SITE_OTHER): Payer: Self-pay | Admitting: Psychology

## 2024-06-28 ENCOUNTER — Encounter (INDEPENDENT_AMBULATORY_CARE_PROVIDER_SITE_OTHER): Admitting: Psychology

## 2024-06-28 NOTE — Telephone Encounter (Signed)
  Office: [phone]  /  Fax: [phone]  Date of Encounter: June 28, 2024  Time of Encounter: 3:03pm Duration of Encounter: ~3 minute(s) Provider: Wyatt Fire, PsyD  CONTENT: Kathy Rios presented for today's appointment via MyChart Video Visit for an initial appointment with this provider; however, she acknowledged she has a cold. The option to reschedule was given and Kathy Rios was receptive. She was encouraged to seek medical attention should sxs worsen and/or persist. She agreed. Based on chart review, a brief risk assessment was completed. Kathy Rios denied experiencing suicidal, self-harm, and homicidal ideation, plan, or intent and she expressed gratitude for this provider checking-in. All questions/concerns addressed.   PLAN: Kathy Rios is scheduled for an appointment on 07/04/2024 at 3pm via MyChart Video Visit.

## 2024-06-30 DIAGNOSIS — F4389 Other reactions to severe stress: Secondary | ICD-10-CM | POA: Diagnosis not present

## 2024-06-30 DIAGNOSIS — F28 Other psychotic disorder not due to a substance or known physiological condition: Secondary | ICD-10-CM | POA: Diagnosis not present

## 2024-07-04 ENCOUNTER — Telehealth (INDEPENDENT_AMBULATORY_CARE_PROVIDER_SITE_OTHER): Admitting: Psychology

## 2024-07-04 DIAGNOSIS — F909 Attention-deficit hyperactivity disorder, unspecified type: Secondary | ICD-10-CM | POA: Diagnosis not present

## 2024-07-04 DIAGNOSIS — F419 Anxiety disorder, unspecified: Secondary | ICD-10-CM | POA: Diagnosis not present

## 2024-07-04 DIAGNOSIS — F29 Unspecified psychosis not due to a substance or known physiological condition: Secondary | ICD-10-CM | POA: Diagnosis not present

## 2024-07-04 DIAGNOSIS — F5089 Other specified eating disorder: Secondary | ICD-10-CM
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
## 2024-07-06 ENCOUNTER — Ambulatory Visit

## 2024-07-07 DIAGNOSIS — F28 Other psychotic disorder not due to a substance or known physiological condition: Secondary | ICD-10-CM | POA: Diagnosis not present

## 2024-07-07 DIAGNOSIS — F4389 Other reactions to severe stress: Secondary | ICD-10-CM | POA: Diagnosis not present

## 2024-07-12 ENCOUNTER — Telehealth (INDEPENDENT_AMBULATORY_CARE_PROVIDER_SITE_OTHER): Admitting: Psychology

## 2024-07-12 DIAGNOSIS — F419 Anxiety disorder, unspecified: Secondary | ICD-10-CM | POA: Diagnosis not present

## 2024-07-12 DIAGNOSIS — F29 Unspecified psychosis not due to a substance or known physiological condition: Secondary | ICD-10-CM

## 2024-07-12 DIAGNOSIS — F5089 Other specified eating disorder: Secondary | ICD-10-CM | POA: Diagnosis not present

## 2024-07-12 DIAGNOSIS — F909 Attention-deficit hyperactivity disorder, unspecified type: Secondary | ICD-10-CM

## 2024-07-12 NOTE — Progress Notes (Signed)
  Office: [phone]  /  Fax: [phone]    Date: July 12, 2024  Appointment Start Time: 11:04am Duration: 26 minutes Provider: Wyatt Fire, Psy.D. Type of Session: Individual Therapy  Location of Patient: Home (private location) Location of Provider: Provider's Home (private office) Type of Contact: Telepsychological Visit via MyChart Video Visit  Session Content: Kathy Rios is a 23 y.o. female presenting for a follow-up appointment to address the previously established treatment goal of increasing coping skills. Today's appointment was a telepsychological visit. Kathy Rios provided verbal consent for today's telepsychological appointment and she is aware she is responsible for securing confidentiality on her end of the session. Prior to proceeding with today's appointment, Kathy Rios's physical location at the time of this appointment was obtained as well a phone number she could be reached at in the event of technical difficulties. Kathy Rios and this provider participated in today's telepsychological service.   This provider conducted a brief check-in. Kathy Rios shared she has made some better choices as it relates to eating. She further discussed a recent break up, noting a plan to discuss further with her primary therapist tomorrow and move on. The previously shared e-mail was resent as she did not receive it. Reviewed emotional and physical hunger. Psychoeducation regarding triggers for emotional eating was provided. Kathy Rios was provided a handout, and encouraged to utilize the handout between now and the next appointment to increase awareness of triggers and frequency. Kathy Rios agreed. Kathy Rios provided verbal consent during today's appointment for this provider to send a handout about triggers via e-mail. Also, discussed disordered eating and ADHD. Overall, Kathy Rios was receptive to today's appointment as evidenced by openness to sharing, responsiveness to feedback, and willingness to explore triggers for  emotional eating.  Mental Status Examination:  Appearance: neat Behavior: appropriate to circumstances Mood: neutral Affect: mood congruent Speech: WNL Eye Contact: appropriate Psychomotor Activity: WNL Gait: unable to assess Thought Process: linear, logical, and goal directed and denies suicidal, homicidal, and self-harm ideation, plan and intent since the last appointment with this provider Thought Content/Perception: no hallucinations, delusions, bizarre thinking or behavior endorsed or observed Orientation: AAOx4 Memory/Concentration: intact Insight: fair Judgment: fair  Interventions:  Conducted a brief chart review Conducted a risk assessment Provided empathic reflections and validation Reviewed content from the previous session Provided positive reinforcement Employed supportive psychotherapy interventions to facilitate reduced distress and to improve coping skills with identified stressors Psychoeducation provided regarding triggers for emotional eating behaviors  DSM-5 Diagnosis(es): F50.89 Other Specified Feeding or Eating Disorder, Emotional Eating Behaviors, F90.9 Unspecified Attention-Deficit/Hyperactivity Disorder , F41.9 Unspecified Anxiety Disorder, and 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 Progress is limited, as Kathy Rios has just begun treatment with this provider; however, she is receptive to the interaction and interventions and rapport is being established.   Plan: The next appointment is scheduled for 07/18/2024 at 11:30am, which will be via MyChart Video Visit. The next session will focus on working towards the established treatment goal. Kathy Rios will continue with her primary therapist and psychiatric provider.    Wyatt Fire, PsyD

## 2024-07-13 ENCOUNTER — Ambulatory Visit (INDEPENDENT_AMBULATORY_CARE_PROVIDER_SITE_OTHER)

## 2024-07-13 VITALS — BP 130/88 | HR 99 | Resp 16 | Ht 65.0 in | Wt 323.0 lb

## 2024-07-13 DIAGNOSIS — Z111 Encounter for screening for respiratory tuberculosis: Secondary | ICD-10-CM | POA: Diagnosis not present

## 2024-07-13 DIAGNOSIS — Z0000 Encounter for general adult medical examination without abnormal findings: Secondary | ICD-10-CM

## 2024-07-13 DIAGNOSIS — Z0001 Encounter for general adult medical examination with abnormal findings: Secondary | ICD-10-CM

## 2024-07-13 DIAGNOSIS — R7303 Prediabetes: Secondary | ICD-10-CM

## 2024-07-13 DIAGNOSIS — Z79899 Other long term (current) drug therapy: Secondary | ICD-10-CM

## 2024-07-13 DIAGNOSIS — E66813 Obesity, class 3: Secondary | ICD-10-CM

## 2024-07-13 DIAGNOSIS — F4389 Other reactions to severe stress: Secondary | ICD-10-CM | POA: Diagnosis not present

## 2024-07-13 DIAGNOSIS — Z23 Encounter for immunization: Secondary | ICD-10-CM

## 2024-07-13 DIAGNOSIS — F28 Other psychotic disorder not due to a substance or known physiological condition: Secondary | ICD-10-CM | POA: Diagnosis not present

## 2024-07-13 DIAGNOSIS — Z6841 Body Mass Index (BMI) 40.0 and over, adult: Secondary | ICD-10-CM | POA: Diagnosis not present

## 2024-07-13 NOTE — Progress Notes (Signed)
 Complete physical exam  Patient: Kathy Rios   DOB: 2001/06/25   23 y.o. Female  MRN: [id number]  Subjective:    Chief Complaint  Patient presents with   Medical Management of Chronic Issues    6 month follow up. States her psychiatrist wants her to have EKG due to psychiatry medication. Has physical form for CNA class     Wava Kildow is a 23 y.o. female who presents today for a complete physical exam. She reports consuming a diabetic, 1500 calorie diet. Home exercise routine includes walking 2 hrs per week. Gym/ health club routine includes stationary bike. She generally feels fairly well. She reports sleeping well. She does not have additional problems to discuss today.    Most recent fall risk assessment:    07/13/2024    2:53 PM  Fall Risk   Falls in the past year? 0  Number falls in past yr: 0  Injury with Fall? 0  Follow up Falls evaluation completed     Most recent depression screenings:    01/25/2024    3:31 PM 01/12/2024   11:10 AM  PHQ 2/9 Scores  PHQ - 2 Score    PHQ- 9 Score       Information is confidential and restricted. Go to Review Flowsheets to unlock data.      Patient Active Problem List   Diagnosis Date Noted   Encounter for preventative adult health care examination 07/17/2024   Obesity, Beginning BMI 49.09 02/10/2024   Fatigue 01/04/2024   Vitamin D  deficiency 07/06/2023   Long-term current use of stimulant 06/16/2023   Decreased appetite 02/10/2023   Need for HPV vaccination 02/10/2023   Schizoaffective disorder, bipolar type (HCC) 07/03/2022   PTSD (post-traumatic stress disorder) 07/03/2022   Long term current use of antipsychotic medication 07/03/2022   GAD (generalized anxiety disorder) 07/03/2022   Seasonal allergies 02/25/2021   Food allergy  02/25/2021   BMI 40.0-44.9, adult (HCC) 08/16/2018   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   ascorbic acid (VITAMIN C) 500 MG tablet Take 500 mg by mouth daily.   b complex vitamins capsule Take 1 capsule by mouth daily.   ferrous sulfate  325 (65 FE) MG tablet Take 325 mg by mouth daily with breakfast.   lisdexamfetamine (VYVANSE ) 60 MG capsule Take 1 capsule (60 mg total) by mouth daily.   lisdexamfetamine (VYVANSE ) 60 MG capsule Take 1 capsule (60 mg total) by mouth every morning.   lisdexamfetamine (VYVANSE ) 60 MG capsule Take 1 capsule (60 mg total) by mouth every morning.   lisdexamfetamine (VYVANSE ) 60 MG capsule Take 1 capsule (60 mg total) by mouth every morning.   lurasidone  (LATUDA ) 40 MG TABS tablet Take 1 tablet (40 mg total) by mouth at bedtime. With meal.   metFORMIN  (GLUCOPHAGE ) 500 MG tablet 2 po with breakfast and 1 po lunch daily   naproxen sodium (ALEVE) 220 MG tablet Take 220 mg by mouth 2 (two) times daily as needed.   olmesartan  (BENICAR ) 40 MG  tablet Take 1 tablet (40 mg total) by mouth daily.   Omega-3 Fatty Acids (OMEGA 3 PO) Take by mouth.   Vitamin D , Ergocalciferol , (DRISDOL ) 1.25 MG (50000 UNIT) CAPS capsule Take 1 capsule (50,000 Units total) by mouth every 7 (seven) days.   [DISCONTINUED] tirzepatide  (ZEPBOUND ) 2.5 MG/0.5ML injection vial Inject 2.5 mg into the skin once a week.   No facility-administered medications prior to visit.    ROS        Objective:     BP 130/88   Pulse 99   Resp 16   Ht 5' 5 (1.651 m)   Wt (!) 323 lb (146.5 kg)   SpO2 99%   BMI 53.75 kg/m  BP Readings from Last 3 Encounters:  07/13/24 130/88   06/23/24 126/78  05/26/24 112/72   Wt Readings from Last 3 Encounters:  07/13/24 (!) 323 lb (146.5 kg)  06/23/24 (!) 319 lb (144.7 kg)  05/26/24 (!) 307 lb (139.3 kg)     Physical Exam Vitals and nursing note reviewed.  Constitutional:      Appearance: Normal appearance. She is obese.  HENT:     Head: Normocephalic.     Right Ear: Tympanic membrane, ear canal and external ear normal.     Left Ear: Tympanic membrane, ear canal and external ear normal.     Nose: Nose normal.     Mouth/Throat:     Mouth: Mucous membranes are moist.     Pharynx: Oropharynx is clear.  Eyes:     Extraocular Movements: Extraocular movements intact.     Pupils: Pupils are equal, round, and reactive to light.  Cardiovascular:     Rate and Rhythm: Normal rate and regular rhythm.  Pulmonary:     Effort: Pulmonary effort is normal.     Breath sounds: Normal breath sounds.  Abdominal:     General: Bowel sounds are normal.     Palpations: Abdomen is soft.  Musculoskeletal:        General: Normal range of motion.     Cervical back: Normal range of motion and neck supple.  Skin:    General: Skin is warm and dry.  Neurological:     Mental Status: She is alert and oriented to person, place, and time.  Psychiatric:        Mood and Affect: Mood normal.        Thought Content: Thought content normal.      Results for orders placed or performed in visit on 07/13/24  QuantiFERON-TB Gold Plus  Result Value Ref Range   QuantiFERON Incubation WILL FOLLOW    QuantiFERON Criteria WILL FOLLOW    QuantiFERON TB1 Ag Value WILL FOLLOW    QuantiFERON TB2 Ag Value WILL FOLLOW    QuantiFERON Nil Value WILL FOLLOW    QuantiFERON Mitogen Value WILL FOLLOW    QuantiFERON-TB Gold Plus WILL FOLLOW   ToxASSURE Select 13 (MW), Urine  Result Value Ref Range   Summary FINAL        Assessment & Plan:    Routine Health Maintenance and Physical Exam  Immunization History  Administered Date(s) Administered   HPV  9-valent 06/27/2022, 08/19/2022, 02/10/2023   Influenza, Seasonal, Injecte, Preservative Fre 07/06/2023, 07/13/2024   Influenza,inj,Quad PF,6+ Mos 08/13/2016, 07/09/2022   Meningococcal Conjugate 11/19/2016   PFIZER Comirnaty(Gray Top)Covid-19 Tri-Sucrose Vaccine 01/03/2020, 02/22/2020   Tdap 06/27/2022, 07/13/2024    Health Maintenance  Topic Date Due   Hepatitis B Vaccines 19-59 Average Risk (1 of 3 -  19+ 3-dose series) Never done   COVID-19 Vaccine (3 - Pfizer risk series) 03/21/2020   CHLAMYDIA SCREENING  03/18/2025   Cervical Cancer Screening (Pap smear)  03/19/2027   DTaP/Tdap/Td (3 - Td or Tdap) 07/13/2034   Influenza Vaccine  Completed   HPV VACCINES  Completed   Hepatitis C Screening  Completed   HIV Screening  Completed   Pneumococcal Vaccine  Aged Out   Meningococcal B Vaccine  Aged Out    Discussed health benefits of physical activity, and encouraged her to engage in regular exercise appropriate for her age and condition.  Problem List Items Addressed This Visit       Other   Prediabetes   Insurance does not cover Zepbound  due to lack of diabetes diagnosis. Discussed potential cost reduction in the new year. - Reassess insurance coverage for Zepbound  in the new year.      Long-term current use of stimulant   EKG in office showed NSR.       Relevant Orders   ToxASSURE Select 13 (MW), Urine (Completed)   Obesity, Beginning BMI 49.09   BMI suggests morbid obesity, but muscle mass assessment indicates otherwise. Insurance does not cover Zepbound  due to prediabetes status. Discussed potential cost reduction in the new year. - Reassess insurance coverage for Zepbound  in the new year.      Encounter for preventative adult health care examination - Primary   Routine visit with no significant issues. 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 - Administered flu vaccine. - Ordered EKG as requested by psychiatrist. - Ordered urine drug  screen as requested by psychiatrist. - Ordered blood TB test. - Ordered titers and blood work for school requirements.      Relevant Orders   EKG 12-Lead (Completed)   Other Visit Diagnoses       PPD screening test       Relevant Orders   QuantiFERON-TB Gold Plus (Completed)     Encounter for immunization       Relevant Orders   Flu vaccine trivalent PF, 6mos and older(Flulaval,Afluria,Fluarix,Fluzone) (Completed)   Tdap vaccine greater than or equal to 7yo IM (Completed)      No follow-ups on file.     Leita Longs, FNP

## 2024-07-15 NOTE — Progress Notes (Signed)
 [name]                                          MRN: [id number]   07/15/2024   The [facility] Quality Team Specialist reviewed this patient medical record for the purposes of chart review for care gap closure. The following were reviewed: chart review for care gap closure-kidney health evaluation for diabetes:eGFR  and uACR.    [facility] Quality Team

## 2024-07-17 ENCOUNTER — Encounter: Payer: Self-pay | Admitting: Obstetrics and Gynecology

## 2024-07-17 DIAGNOSIS — Z0000 Encounter for general adult medical examination without abnormal findings: Secondary | ICD-10-CM | POA: Insufficient documentation

## 2024-07-17 LAB — TOXASSURE SELECT 13 (MW), URINE

## 2024-07-17 NOTE — Assessment & Plan Note (Signed)
 Insurance does not cover Zepbound  due to lack of diabetes diagnosis. Discussed potential cost reduction in the new year. - Reassess insurance coverage for Zepbound  in the new year.

## 2024-07-17 NOTE — Assessment & Plan Note (Signed)
 EKG in office showed NSR.

## 2024-07-17 NOTE — Assessment & Plan Note (Signed)
 Routine visit with no significant issues. 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 - Administered flu vaccine. - Ordered EKG as requested by psychiatrist. - Ordered urine drug screen as requested by psychiatrist. - Ordered blood TB test. - Ordered titers and blood work for school requirements.

## 2024-07-17 NOTE — Assessment & Plan Note (Signed)
 BMI suggests morbid obesity, but muscle mass assessment indicates otherwise. Insurance does not cover Zepbound  due to prediabetes status. Discussed potential cost reduction in the new year. - Reassess insurance coverage for Zepbound  in the new year.

## 2024-07-18 ENCOUNTER — Telehealth (INDEPENDENT_AMBULATORY_CARE_PROVIDER_SITE_OTHER): Admitting: Psychology

## 2024-07-18 DIAGNOSIS — F419 Anxiety disorder, unspecified: Secondary | ICD-10-CM

## 2024-07-18 DIAGNOSIS — F909 Attention-deficit hyperactivity disorder, unspecified type: Secondary | ICD-10-CM

## 2024-07-18 DIAGNOSIS — F29 Unspecified psychosis not due to a substance or known physiological condition: Secondary | ICD-10-CM | POA: Diagnosis not present

## 2024-07-18 DIAGNOSIS — F902 Attention-deficit hyperactivity disorder, combined type: Secondary | ICD-10-CM | POA: Diagnosis not present

## 2024-07-18 DIAGNOSIS — F250 Schizoaffective disorder, bipolar type: Secondary | ICD-10-CM | POA: Diagnosis not present

## 2024-07-18 DIAGNOSIS — F4312 Post-traumatic stress disorder, chronic: Secondary | ICD-10-CM | POA: Diagnosis not present

## 2024-07-18 DIAGNOSIS — F411 Generalized anxiety disorder: Secondary | ICD-10-CM | POA: Diagnosis not present

## 2024-07-18 DIAGNOSIS — F5089 Other specified eating disorder: Secondary | ICD-10-CM | POA: Diagnosis not present

## 2024-07-18 LAB — QUANTIFERON-TB GOLD PLUS
QuantiFERON Mitogen Value: >10 [IU]/mL
QuantiFERON Nil Value: 0.01 [IU]/mL
QuantiFERON TB1 Ag Value: 0 [IU]/mL
QuantiFERON TB2 Ag Value: 0.02 [IU]/mL
QuantiFERON-TB Gold Plus: NEGATIVE

## 2024-07-18 NOTE — Progress Notes (Signed)
  Office: [phone]  /  Fax: [phone]    Date: July 18, 2024  Appointment Start Time: 11:31am Duration: 32 minutes Provider: Wyatt Fire, Psy.D. Type of Session: Individual Therapy  Location of Patient: Home (private location) Location of Provider: Provider's Home (private office) Type of Contact: Telepsychological Visit via MyChart Video Visit  Session Content: Kathy Rios is a 23 y.o. female presenting for a follow-up appointment to address the previously established treatment goal of increasing coping skills. Today's appointment was a telepsychological visit. [name] provided verbal consent for today's telepsychological appointment and she is aware she is responsible for securing confidentiality on her end of the session. Prior to proceeding with today's appointment, [name]'s physical location at the time of this appointment was obtained as well a phone number she could be reached at in the event of technical difficulties. [name] and this provider participated in today's telepsychological service.   This provider conducted a brief check-in. Kathy Rios shared, I've been kind of tired. Further explored. She identified experiencing a headache and bloating, adding her menstrual cycle is 7-days late. She noted she contacted her OBGYN via MyChart but has not received a response. She will call her provider today. Reviewed triggers for emotional eating behaviors. Kathy Rios identified eating out of convenience. A recent example of eating out was utilized and processed. Additionally, psychoeducation provided regarding urge surfing. [name] provided verbal consent during today's appointment for this provider to send a handout for urge surfing via e-mail. Also discussed meal prepping via doubling recipes when possible. Overall,  Kathy Rios was receptive to today's appointment as evidenced by openness to sharing, responsiveness to feedback, and willingness to implement discussed strategies .  Mental Status  Examination:  Appearance: neat Behavior: appropriate to circumstances Mood: neutral Affect: mood congruent Speech: WNL Eye Contact: appropriate Psychomotor Activity: WNL Gait: unable to assess Thought Process: linear, logical, and goal directed and denies suicidal, homicidal, and self-harm ideation, plan and intent since the last appointment with this provider  Thought Content/Perception: no hallucinations, delusions, bizarre thinking or behavior endorsed or observed Orientation: AAOx4 Memory/Concentration: intact Insight: fair Judgment: fair  Interventions:  Conducted a brief chart review Conducted a risk assessment Provided empathic reflections and validation Reviewed content from the previous session Provided positive reinforcement Employed supportive psychotherapy interventions to facilitate reduced distress and to improve coping skills with identified stressors Psychoeducation provided regarding urge surfing   DSM-5 Diagnosis(es): F50.89 Other Specified Feeding or Eating Disorder, Emotional Eating Behaviors, F90.9 Unspecified Attention-Deficit/Hyperactivity Disorder , F41.9 Unspecified Anxiety Disorder, and 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 Kathy Rios has demonstrated progress in her goal as evidenced by increased awareness of hunger patterns and increased awareness of triggers for emotional eating behaviors. Kathy Rios also continues to demonstrate willingness to engage in learned skill(s).  Plan: The next appointment is scheduled for 08/02/2024 at 11:30am, which will be via MyChart Video Visit. The next session will focus on working towards the established treatment goal and termination. Kathy Rios will continue with her primary therapist and psychiatric provider.      Wyatt Fire, PsyD

## 2024-07-21 ENCOUNTER — Ambulatory Visit (INDEPENDENT_AMBULATORY_CARE_PROVIDER_SITE_OTHER): Payer: Self-pay | Admitting: Family Medicine

## 2024-07-21 ENCOUNTER — Encounter (INDEPENDENT_AMBULATORY_CARE_PROVIDER_SITE_OTHER): Payer: Self-pay | Admitting: Family Medicine

## 2024-07-21 VITALS — BP 127/81 | HR 91 | Temp 97.5°F | Ht 65.0 in | Wt 325.0 lb

## 2024-07-21 DIAGNOSIS — E559 Vitamin D deficiency, unspecified: Secondary | ICD-10-CM | POA: Diagnosis not present

## 2024-07-21 DIAGNOSIS — Z6841 Body Mass Index (BMI) 40.0 and over, adult: Secondary | ICD-10-CM | POA: Diagnosis not present

## 2024-07-21 DIAGNOSIS — E7800 Pure hypercholesterolemia, unspecified: Secondary | ICD-10-CM | POA: Diagnosis not present

## 2024-07-21 DIAGNOSIS — R7303 Prediabetes: Secondary | ICD-10-CM

## 2024-07-21 DIAGNOSIS — F4389 Other reactions to severe stress: Secondary | ICD-10-CM | POA: Diagnosis not present

## 2024-07-21 DIAGNOSIS — F28 Other psychotic disorder not due to a substance or known physiological condition: Secondary | ICD-10-CM | POA: Diagnosis not present

## 2024-07-21 DIAGNOSIS — E669 Obesity, unspecified: Secondary | ICD-10-CM

## 2024-07-21 MED ORDER — VITAMIN D (ERGOCALCIFEROL) 1.25 MG (50000 UNIT) PO CAPS: 50000.0000 [IU] | ORAL_CAPSULE | ORAL | 1 refills | Status: AC

## 2024-07-21 NOTE — Progress Notes (Signed)
 Office: [phone]  /  Fax: [phone]  WEIGHT SUMMARY AND BIOMETRICS  Anthropometric Measurements Height: 5' 5 (1.651 m) Weight: (!) 325 lb (147.4 kg) BMI (Calculated): 54.08 Weight at Last Visit: 319 lb Weight Lost Since Last Visit: 0 Weight Gained Since Last Visit: 6 lb Starting Weight: 295 lb Total Weight Loss (lbs): 0 lb (0 kg) Peak Weight: 337 lb   Body Composition  Body Fat %: 54.1 % Fat Mass (lbs): 176.2 lbs Muscle Mass (lbs): 141.8 lbs Total Body Water (lbs): 103.4 lbs Visceral Fat Rating : 18   Other Clinical Data Fasting: yes Labs: yes Today's Visit #: 14 Starting Date: 11/09/23 Comments: [id number]    Chief Complaint: OBESITY    History of Present Illness Kathy Rios is a 23 year old female with obesity and prediabetes who presents for obesity treatment and progress assessment.  She is following a [place] diet plan of 1500 to 1900 calories with 140 or more grams of protein, achieving these dietary goals about 25% of the time. She attempts to consume more fruits and vegetables and maintain adequate hydration but often skips meals. She has gained six pounds in the last month since her last visit. She generally sleeps seven to nine hours per night but is not currently engaging in physical exercise.  She is being treated for prediabetes with metformin , taking 500 mg two pills a day with breakfast and one pill a day with lunch, occasionally adjusting the timing to one pill at breakfast, one at lunch, and one at dinner. She is due for lab work to monitor her condition.  She is also being treated for vitamin D  deficiency with prescription ergocalciferol  50,000 IU weekly and is due for lab checks. Her last LDL was elevated at 168, and she is currently taking a statin.  She did not report feeling puffy or bloated, although there was a mention of water retention. She noted a slight increase in muscle mass, confirmed by her best friend. She is  working on fat loss and maintaining muscle mass.  She mentioned a recent scare of potential pregnancy due to a late menstrual cycle, which is irregular and currently ten days late. She started spotting this morning, suggesting the onset of her cycle. She is concerned about PCOS due to her irregular cycles and weight issues, but previous hormone tests showed higher estrogen and lower testosterone  levels.      PHYSICAL EXAM:  Blood pressure 127/81, pulse 91, temperature (!) 97.5 F (36.4 C), height 5' 5 (1.651 m), weight (!) 325 lb (147.4 kg), SpO2 100%. Body mass index is 54.08 kg/m.  DIAGNOSTIC DATA REVIEWED:  BMET    Component Value Date/Time   NA 136 04/13/2024 1039   K 5.0 04/13/2024 1039   CL 101 04/13/2024 1039   CO2 20 04/13/2024 1039   GLUCOSE 88 04/13/2024 1039   GLUCOSE 106 (H) 09/23/2021 1824   BUN 17 04/13/2024 1039   CREATININE 0.71 04/13/2024 1039   CALCIUM 10.1 04/13/2024 1039   GFRNONAA >60 09/23/2021 1824   GFRAA 146 08/08/2020 1453   Lab Results  Component Value Date   HGBA1C 5.7 (H) 04/13/2024   HGBA1C 6.0 (H) 05/23/2021   Lab Results  Component Value Date   INSULIN  29.8 (H) 04/13/2024   Lab Results  Component Value Date   TSH 1.460 03/09/2024   CBC    Component Value Date/Time   WBC 7.3 04/13/2024 1039   WBC 8.1 01/13/2024 0930   RBC 5.35 (H) 04/13/2024  1039   RBC 4.91 01/13/2024 0930   HGB 11.2 04/13/2024 1039   HCT 37.6 04/13/2024 1039   PLT 420 04/13/2024 1039   MCV 70 (L) 04/13/2024 1039   MCH 20.9 (L) 04/13/2024 1039   MCH 21.6 (L) 01/13/2024 0930   MCHC 29.8 (L) 04/13/2024 1039   MCHC 30.6 01/13/2024 0930   RDW 15.9 (H) 04/13/2024 1039   Iron Studies    Component Value Date/Time   IRON 46 01/13/2024 0930   IRON 36 01/04/2024 1048   TIBC 328 01/13/2024 0930   TIBC 281 01/04/2024 1048   FERRITIN 106 01/13/2024 0930   FERRITIN 200 (H) 01/04/2024 1048   IRONPCTSAT 14 01/13/2024 0930   IRONPCTSAT 13 (L) 01/04/2024 1048    Lipid Panel     Component Value Date/Time   CHOL 245 (H) 04/13/2024 1039   TRIG 71 04/13/2024 1039   HDL 65 04/13/2024 1039   CHOLHDL 4.2 01/04/2024 1048   LDLCALC 168 (H) 04/13/2024 1039   Hepatic Function Panel     Component Value Date/Time   PROT 8.2 04/13/2024 1039   ALBUMIN 4.4 04/13/2024 1039   AST 11 04/13/2024 1039   ALT 9 04/13/2024 1039   ALKPHOS 116 04/13/2024 1039   BILITOT 0.3 04/13/2024 1039      Component Value Date/Time   TSH 1.460 03/09/2024 1217   Nutritional Lab Results  Component Value Date   VD25OH 31.3 04/13/2024   VD25OH 34.8 01/04/2024   VD25OH 33.1 11/09/2023     Assessment and Plan Assessment & Plan Obesity Recent weight gain of six pounds over the last month. She is on a dietary plan of 1500-1900 calories and 140 grams of protein daily but struggles with protein intake and meal skipping. She is not currently exercising. Muscle mass has improved slightly, indicating better protein intake. She is aware of the need for fat loss and is working on dietary changes. - Continue dietary plan of 1500-1900 calories and 140 grams of protein daily. - Encouraged increased protein intake and mindful eating. - Discussed strategies for managing holiday temptations, including using sugar-free hot chocolate and Fairlife milk. - Encouraged continuation of portion control and mindful eating habits. - Scheduled follow-up appointment in January.  Prediabetes Managed with metformin  500 mg, two pills with breakfast and one with lunch. Labs are due to assess current status. She is aware of the importance of managing insulin  resistance and is following dietary recommendations. - Continue metformin  500 mg, two pills with breakfast and one with lunch. - Ordered labs to check A1c and insulin  levels. - Encouraged adherence to dietary recommendations to manage insulin  resistance.  Vitamin D  deficiency Managed with prescription ergocalciferol  50,000 IU weekly. Labs are  due to assess current status. - Continue ergocalciferol  50,000 IU weekly. - Ordered labs to check vitamin D  levels. - Refilled vitamin D  prescription.  Hypercholesterolemia Last LDL at 168 mg/dL. She is on a statin and labs are due to assess improvement. - Continue statin therapy. - Ordered labs to check lipid levels. - Continue diet, exercise and weight loss as discussed today as an important part of the treatment plan       Patients who are on anti-obesity medications are counseled on the importance of maintaining healthy lifestyle habits, including balanced nutrition, regular physical activity, and behavioral modifications,  Medication is an adjunct to, not a replacement for, lifestyle changes and that the long-term success and weight maintenance depend on continued adherence to these strategies.   Kathy Rios was informed of  the importance of frequent follow up visits to maximize her success with intensive lifestyle modifications for her obesity and obesity related health conditions as recommended by USPSTF and CMS guidelines   Kathy Penton, MD

## 2024-07-22 LAB — CMP14+EGFR
ALT: 12 IU/L (ref 0–32)
AST: 14 IU/L (ref 0–40)
Albumin: 3.9 g/dL — ABNORMAL LOW (ref 4.0–5.0)
Alkaline Phosphatase: 134 IU/L — ABNORMAL HIGH (ref 41–116)
BUN/Creatinine Ratio: 21 (ref 9–23)
BUN: 11 mg/dL (ref 6–20)
Bilirubin Total: 0.2 mg/dL (ref 0.0–1.2)
CO2: 19 mmol/L — ABNORMAL LOW (ref 20–29)
Calcium: 9 mg/dL (ref 8.7–10.2)
Chloride: 102 mmol/L (ref 96–106)
Creatinine, Ser: 0.53 mg/dL — ABNORMAL LOW (ref 0.57–1.00)
Globulin, Total: 3.4 g/dL (ref 1.5–4.5)
Glucose: 86 mg/dL (ref 70–99)
Potassium: 4.4 mmol/L (ref 3.5–5.2)
Sodium: 138 mmol/L (ref 134–144)
Total Protein: 7.3 g/dL (ref 6.0–8.5)
eGFR: 132 mL/min/1.73 (ref >59)

## 2024-07-22 LAB — LIPID PANEL WITH LDL/HDL RATIO
Cholesterol, Total: 213 mg/dL — ABNORMAL HIGH (ref 100–199)
HDL: 62 mg/dL (ref >39)
LDL Chol Calc (NIH): 138 mg/dL — ABNORMAL HIGH (ref 0–99)
LDL/HDL Ratio: 2.2 ratio (ref 0.0–3.2)
Triglycerides: 75 mg/dL (ref 0–149)
VLDL Cholesterol Cal: 13 mg/dL (ref 5–40)

## 2024-07-22 LAB — HEMOGLOBIN A1C
Est. average glucose Bld gHb Est-mCnc: 120 mg/dL
Hgb A1c MFr Bld: 5.8 % — ABNORMAL HIGH (ref 4.8–5.6)

## 2024-07-22 LAB — HCG, SERUM, QUALITATIVE: hCG,Beta Subunit,Qual,Serum: NEGATIVE m[IU]/mL (ref <6)

## 2024-07-22 LAB — VITAMIN D 25 HYDROXY (VIT D DEFICIENCY, FRACTURES): Vit D, 25-Hydroxy: 29.5 ng/mL — ABNORMAL LOW (ref 30.0–100.0)

## 2024-07-22 LAB — INSULIN, RANDOM: INSULIN: 35.8 u[IU]/mL — ABNORMAL HIGH (ref 2.6–24.9)

## 2024-07-25 ENCOUNTER — Ambulatory Visit: Admitting: Obstetrics and Gynecology

## 2024-07-26 ENCOUNTER — Ambulatory Visit: Admitting: Adult Health

## 2024-07-28 DIAGNOSIS — F4389 Other reactions to severe stress: Secondary | ICD-10-CM | POA: Diagnosis not present

## 2024-07-28 DIAGNOSIS — F28 Other psychotic disorder not due to a substance or known physiological condition: Secondary | ICD-10-CM | POA: Diagnosis not present

## 2024-08-02 ENCOUNTER — Telehealth (INDEPENDENT_AMBULATORY_CARE_PROVIDER_SITE_OTHER): Admitting: Psychology

## 2024-08-02 DIAGNOSIS — F4312 Post-traumatic stress disorder, chronic: Secondary | ICD-10-CM | POA: Diagnosis not present

## 2024-08-02 DIAGNOSIS — F909 Attention-deficit hyperactivity disorder, unspecified type: Secondary | ICD-10-CM

## 2024-08-02 DIAGNOSIS — F419 Anxiety disorder, unspecified: Secondary | ICD-10-CM

## 2024-08-02 DIAGNOSIS — F5089 Other specified eating disorder: Secondary | ICD-10-CM | POA: Diagnosis not present

## 2024-08-02 DIAGNOSIS — F29 Unspecified psychosis not due to a substance or known physiological condition: Secondary | ICD-10-CM

## 2024-08-02 DIAGNOSIS — F250 Schizoaffective disorder, bipolar type: Secondary | ICD-10-CM | POA: Diagnosis not present

## 2024-08-02 DIAGNOSIS — F411 Generalized anxiety disorder: Secondary | ICD-10-CM | POA: Diagnosis not present

## 2024-08-02 DIAGNOSIS — F902 Attention-deficit hyperactivity disorder, combined type: Secondary | ICD-10-CM | POA: Diagnosis not present

## 2024-08-02 NOTE — Progress Notes (Signed)
°  Office: [phone]  /  Fax: [phone]    Date: August 02, 2024  Appointment Start Time: 11:30am Duration: 23 minutes Provider: Wyatt Fire, Psy.D. Type of Session: Individual Therapy  Location of Patient: Home (private location) Location of Provider: [facility] at [facility] Type of Contact: Telepsychological Visit via MyChart Video Visit  Session Content: Kathy Rios is a 23 y.o. female presenting for a follow-up appointment to address the previously established treatment goal of increasing coping skills.Today's appointment was a telepsychological visit. Kathy Rios provided verbal consent for today's telepsychological appointment and she is aware she is responsible for securing confidentiality on her end of the session. Prior to proceeding with today's appointment, Kathy Rios's physical location at the time of this appointment was obtained as well a phone number she could be reached at in the event of technical difficulties. Kathy Rios and this provider participated in today's telepsychological service.   This provider conducted a brief check-in. Kathy Rios reported her eating habits are better. Further explored. She indicated an increase in fiber rich foods and protein. She also indicated she is journaling food intake more, which has resulted in her being even more mindful regarding her eating habits. Explored what she feels helped with the abovementioned change. She noted belief that increased conversation regarding her eating habits and weight loss as well as evidence of lab improvement (e.g., cholesterol) provided further motivation. Today's appointment also focused on processing termination. Kathy Rios was receptive to today's appointment as evidenced by openness to sharing, responsiveness to feedback, and willingness to continue engaging in learned skills.  Mental Status Examination:  Appearance: neat Behavior: appropriate to circumstances Mood: neutral Affect: mood congruent Speech: WNL Eye  Contact: appropriate Psychomotor Activity: WNL Gait: unable to assess Thought Process: linear, logical, and goal directed and denies suicidal, homicidal, and self-harm ideation, plan and intent  Thought Content/Perception: no hallucinations, delusions, bizarre thinking or behavior endorsed or observed Orientation: AAOx4 Memory/Concentration: intact Insight: fair Judgment: fair  Interventions:  Conducted a brief chart review Conducted a risk assessment Provided empathic reflections and validation Provided positive reinforcement Employed supportive psychotherapy interventions to facilitate reduced distress and to improve coping skills with identified stressors  DSM-5 Diagnosis(es):  F50.89 Other Specified Feeding or Eating Disorder, Emotional Eating Behaviors, F90.9 Unspecified Attention-Deficit/Hyperactivity Disorder , F41.9 Unspecified Anxiety Disorder, and 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 Kathy Rios demonstrated progress in her goal as evidenced by increased awareness of hunger patterns and increased awareness of triggers for emotional eating behaviors. Kathy Rios also continues to demonstrate willingness to engage in learned skill(s).   Plan: As previously planned, today was Kathy Rios's last appointment with this provider and she is aware of this provider's departure from [facility]. Kathy Rios will continue with her primary therapist and psychiatric provider. No further follow-up planned by this provider.    Wyatt Fire, PsyD

## 2024-08-05 ENCOUNTER — Other Ambulatory Visit: Payer: Self-pay

## 2024-08-05 DIAGNOSIS — I10 Essential (primary) hypertension: Secondary | ICD-10-CM

## 2024-08-05 MED ORDER — OLMESARTAN MEDOXOMIL 40 MG PO TABS: 40.0000 mg | ORAL_TABLET | Freq: Every day | ORAL | 2 refills | Status: DC

## 2024-08-05 NOTE — Telephone Encounter (Signed)
 Needs a new RX written Dr.Dixon was the last one who ordered it PCP advised

## 2024-08-09 DIAGNOSIS — F28 Other psychotic disorder not due to a substance or known physiological condition: Secondary | ICD-10-CM | POA: Diagnosis not present

## 2024-08-09 DIAGNOSIS — F4389 Other reactions to severe stress: Secondary | ICD-10-CM | POA: Diagnosis not present

## 2024-08-17 ENCOUNTER — Other Ambulatory Visit: Payer: Self-pay

## 2024-08-17 DIAGNOSIS — I10 Essential (primary) hypertension: Secondary | ICD-10-CM

## 2024-08-17 MED ORDER — OLMESARTAN MEDOXOMIL 40 MG PO TABS: 40.0000 mg | ORAL_TABLET | Freq: Every day | ORAL | 2 refills | Status: AC

## 2024-08-23 ENCOUNTER — Ambulatory Visit

## 2024-08-25 ENCOUNTER — Ambulatory Visit (INDEPENDENT_AMBULATORY_CARE_PROVIDER_SITE_OTHER): Admitting: Family Medicine

## 2024-08-31 ENCOUNTER — Encounter (HOSPITAL_COMMUNITY): Payer: Self-pay

## 2024-08-31 ENCOUNTER — Other Ambulatory Visit: Payer: Self-pay

## 2024-08-31 ENCOUNTER — Emergency Department (HOSPITAL_COMMUNITY)
Admission: EM | Admit: 2024-08-31 | Discharge: 2024-08-31 | Disposition: A | Discharge status: Home / Self Care | Attending: Emergency Medicine | Admitting: Emergency Medicine

## 2024-08-31 DIAGNOSIS — Z7984 Long term (current) use of oral hypoglycemic drugs: Secondary | ICD-10-CM | POA: Diagnosis not present

## 2024-08-31 DIAGNOSIS — R519 Headache, unspecified: Secondary | ICD-10-CM | POA: Insufficient documentation

## 2024-08-31 DIAGNOSIS — I10 Essential (primary) hypertension: Secondary | ICD-10-CM | POA: Insufficient documentation

## 2024-08-31 DIAGNOSIS — R059 Cough, unspecified: Secondary | ICD-10-CM | POA: Diagnosis not present

## 2024-08-31 DIAGNOSIS — R0981 Nasal congestion: Secondary | ICD-10-CM | POA: Diagnosis present

## 2024-08-31 DIAGNOSIS — Z79899 Other long term (current) drug therapy: Secondary | ICD-10-CM | POA: Insufficient documentation

## 2024-08-31 LAB — RESP PANEL BY RT-PCR (RSV, FLU A&B, COVID)  RVPGX2
Influenza A by PCR: NEGATIVE
Influenza B by PCR: NEGATIVE
Resp Syncytial Virus by PCR: NEGATIVE
SARS Coronavirus 2 by RT PCR: NEGATIVE

## 2024-08-31 MED ORDER — KETOROLAC TROMETHAMINE 30 MG/ML IJ SOLN
30.0000 mg | Freq: Once | INTRAMUSCULAR | Status: AC
  Administered 2024-08-31: 30 mg via INTRAMUSCULAR
  Filled 2024-08-31: qty 1

## 2024-08-31 MED ORDER — FLUTICASONE PROPIONATE 50 MCG/ACT NA SUSP: 1.0000 | Freq: Every day | NASAL | 2 refills | Status: AC

## 2024-08-31 MED ORDER — SALINE SPRAY 0.65 % NA SOLN: 1.0000 | NASAL | 0 refills | Status: AC | PRN

## 2024-08-31 NOTE — Discharge Instructions (Signed)
 You were seen today for sinus pressure and congestion.  Your COVID and flu test is negative although this is likely viral in nature.  Add Flonase  and nasal saline.  You can put a humidifier in your room.  Take naproxen twice daily.

## 2024-08-31 NOTE — ED Notes (Signed)
 Pt ambulatory to restroom w/o assistance. Gait steady, no imbalances noted.

## 2024-08-31 NOTE — ED Triage Notes (Signed)
 Pt reports sinus pressure/pain and congestion starting last night. Tested (-) for Flu and covid at urgent care. Denies fever, N/V/D. Pt awake and alert, no respiratory distress noted, ambulating independently to triage with ease. 1000mg  tylenol  taken at 1pm and at 5:30pm as well. Naproxen taken in the morning.

## 2024-08-31 NOTE — ED Provider Notes (Signed)
 " [place] EMERGENCY DEPARTMENT AT [facility] Provider Note   CSN: [id number] Arrival date & time: 08/31/24  2010     Patient presents with: Nasal Congestion and Facial Pain   Kathy Rios is a 24 y.o. female.   HPI     This a 24 year old female who presents with congestion.  Patient reports 2-day history of congestion and sinus pain and pressure.  Reports that mostly over her left face.  She tested negative for COVID and flu at urgent care.  She has not had any fevers.  Has had a little bit of sore throat and cough.  No nausea, vomiting, diarrhea.  She has taken Tylenol  and naproxen as well as Sudafed with minimal relief.  States that pain is worse when she lays flat and at night.  Prior to Admission medications  Medication Sig Start Date End Date Taking? Authorizing Provider  fluticasone  (FLONASE ) 50 MCG/ACT nasal spray Place 1 spray into both nostrils daily. 08/31/24  Yes Jeanise Durfey, Charmaine FALCON, MD  sodium chloride  (OCEAN) 0.65 % SOLN nasal spray Place 1 spray into both nostrils as needed for congestion. 08/31/24  Yes Cabela Pacifico, Charmaine FALCON, MD  acetaminophen  (TYLENOL ) 500 MG tablet Take 1,000 mg by mouth every 6 (six) hours as needed for mild pain or headache.    [provider]  ascorbic acid (VITAMIN C) 500 MG tablet Take 500 mg by mouth daily.    [provider]  b complex vitamins capsule Take 1 capsule by mouth daily. 12/09/23   Opalski, Barnie, DO  ferrous sulfate  325 (65 FE) MG tablet Take 325 mg by mouth daily with breakfast.    [provider]  lisdexamfetamine  (VYVANSE ) 60 MG capsule Take 1 capsule (60 mg total) by mouth daily. 03/30/24 07/21/24  Barbra Jayson LABOR, MD  lisdexamfetamine  (VYVANSE ) 60 MG capsule Take 1 capsule (60 mg total) by mouth every morning. 12/29/23   Barbra Jayson LABOR, MD  lisdexamfetamine  (VYVANSE ) 60 MG capsule Take 1 capsule (60 mg total) by mouth every morning. 02/28/24   Barbra Jayson LABOR, MD  lisdexamfetamine   (VYVANSE ) 60 MG capsule Take 1 capsule (60 mg total) by mouth every morning. 02/04/24   Saucier, Dorn Ruth, NP  lurasidone  (LATUDA ) 40 MG TABS tablet Take 1 tablet (40 mg total) by mouth at bedtime. With meal. 12/29/23   Barbra Jayson LABOR, MD  metFORMIN  (GLUCOPHAGE ) 500 MG tablet 2 po with breakfast and 1 po lunch daily 06/23/24   Beasley, Caren D, MD  naproxen sodium (ALEVE) 220 MG tablet Take 220 mg by mouth 2 (two) times daily as needed.    [provider]  olmesartan  (BENICAR ) 40 MG tablet Take 1 tablet (40 mg total) by mouth daily. 08/17/24   Bevely Doffing, FNP  Omega-3 Fatty Acids (OMEGA 3 PO) Take by mouth.    [provider]  Vitamin D , Ergocalciferol , (DRISDOL ) 1.25 MG (50000 UNIT) CAPS capsule Take 1 capsule (50,000 Units total) by mouth every 7 (seven) days. 07/21/24   Verdon Parry D, MD    Allergies: Selenium sulfide, Amoxicillin, Banana, Motrin [ibuprofen], Nickel, and Sulfa antibiotics    Review of Systems  Constitutional:  Negative for fever.  HENT:  Positive for congestion, ear pain, sinus pressure, sinus pain and sore throat.   Respiratory:  Positive for cough.   All other systems reviewed and are negative.   Updated Vital Signs BP (!) 167/104 (BP Location: Right Arm)   Pulse (!) 107   Temp 98.7 F (37.1 C) (  Oral)   Resp (!) 22   Ht 1.651 m (5' 5)   Wt (!) 151.5 kg   SpO2 97%   BMI 55.58 kg/m   Physical Exam Vitals and nursing note reviewed.  Constitutional:      Appearance: She is well-developed. She is obese. She is not ill-appearing.  HENT:     Head: Normocephalic and atraumatic.     Ears:     Comments: Bilateral TMs full but nonerythematous or bulging    Mouth/Throat:     Mouth: Mucous membranes are moist.     Comments: Uvula midline, no erythema, no tonsillar exudate or swelling Eyes:     Pupils: Pupils are equal, round, and reactive to light.  Cardiovascular:     Rate and Rhythm: Normal rate and regular rhythm.     Heart sounds:  Normal heart sounds.  Pulmonary:     Effort: Pulmonary effort is normal. No respiratory distress.     Breath sounds: No wheezing.  Abdominal:     Palpations: Abdomen is soft.  Musculoskeletal:     Cervical back: Neck supple.  Skin:    General: Skin is warm and dry.  Neurological:     Mental Status: She is alert and oriented to person, place, and time.  Psychiatric:        Mood and Affect: Mood normal.     (all labs ordered are listed, but only abnormal results are displayed) Labs Reviewed  RESP PANEL BY RT-PCR (RSV, FLU A&B, COVID)  [id number]    EKG: None  Radiology: No results found.   Procedures   Medications Ordered in the ED  ketorolac  (TORADOL ) 30 MG/ML injection 30 mg (has no administration in time range)                                    Medical Decision Making Risk OTC drugs. Prescription drug management.   This patient presents to the ED for concern of congestion, sinus pressure, this involves an extensive number of treatment options, and is a complaint that carries with it a high risk of complications and morbidity.  I considered the following differential and admission for this acute, potentially life threatening condition.  The differential diagnosis includes viral illness, sinus headache, sinus infection  MDM:    This is a 24 year old female who presents with congestion and sinus pressure.  She is nontoxic.  Vital signs notable for mild tachycardia.  She is afebrile.  Suspect viral illness although COVID and flu testing is negative.  She is only had 2 days of symptoms.  She is afebrile.  Lower suspicion for bacterial sinusitis at this point.  Recommend ongoing supportive measures.  Would add Flonase  and nasal saline and take scheduled naproxen if she tolerates this.  She may also continue Sudafed.  (Labs, imaging, consults)  Labs: I Ordered, and personally interpreted labs.  The pertinent results include: COVID, flu  Imaging Studies ordered: I  ordered imaging studies including none I independently visualized and interpreted imaging. I agree with the radiologist interpretation  Additional history obtained from chart review.  External records from outside source obtained and reviewed including prior evaluations  Cardiac Monitoring: The patient was not maintained on a cardiac monitor.  If on the cardiac monitor, I personally viewed and interpreted the cardiac monitored which showed an underlying rhythm of: N/A  Reevaluation: After the interventions noted above, I reevaluated the patient and found that  they have :stayed the same  Social Determinants of Health:  lives independently  Disposition: Discharge  Co morbidities that complicate the patient evaluation  Past Medical History:  Diagnosis Date   ADD (attention deficit disorder)    ADHD (attention deficit hyperactivity disorder)    Alpha thalassemia trait 08/20/2012   Anemia    Anxiety    Back pain    Bipolar 1 disorder (HCC)    Chronic constipation 03/08/2018   Depression    Diabetes mellitus, type II (HCC)    Edema of both lower extremities    Generalized anxiety disorder 07/03/2022   Hypertension    Insomnia 06/27/2022   Iron deficiency    Irregular periods    Major depressive disorder 03/22/2018   Multiple food allergies    Obesity    PMS (premenstrual syndrome) 11/05/2015   PTSD (post-traumatic stress disorder)    Reactive hypoglycemia    Schizoaffective disorder, depressive type (HCC)    SOB (shortness of breath) on exertion    Thalassemia trait, alpha    Urticaria      Medicines Meds ordered this encounter  Medications   ketorolac  (TORADOL ) 30 MG/ML injection 30 mg   fluticasone  (FLONASE ) 50 MCG/ACT nasal spray    Sig: Place 1 spray into both nostrils daily.    Dispense:  16 g    Refill:  2   sodium chloride  (OCEAN) 0.65 % SOLN nasal spray    Sig: Place 1 spray into both nostrils as needed for congestion.    Dispense:  480 mL    Refill:  0     I have reviewed the patients home medicines and have made adjustments as needed  Problem List / ED Course: Problem List Items Addressed This Visit   None Visit Diagnoses       Sinus congestion    -  Primary                Final diagnoses:  Sinus congestion    ED Discharge Orders          Ordered    fluticasone  (FLONASE ) 50 MCG/ACT nasal spray  Daily        08/31/24 2315    sodium chloride  (OCEAN) 0.65 % SOLN nasal spray  As needed        08/31/24 2315               Bari Charmaine FALCON, MD 08/31/24 2318  "

## 2024-08-31 NOTE — ED Triage Notes (Signed)
(-)   for strep at [facility] as well.

## 2024-09-03 ENCOUNTER — Ambulatory Visit: Payer: Self-pay

## 2024-09-29 ENCOUNTER — Ambulatory Visit (INDEPENDENT_AMBULATORY_CARE_PROVIDER_SITE_OTHER): Admitting: Family Medicine

## 2024-10-26 ENCOUNTER — Ambulatory Visit: Admitting: Allergy & Immunology

## 2025-01-04 ENCOUNTER — Other Ambulatory Visit

## 2025-01-11 ENCOUNTER — Ambulatory Visit: Admitting: Physician Assistant
